# Patient Record
Sex: Female | Born: 1935 | Race: Black or African American | Hispanic: No | Smoking: Never smoker
Health system: Southern US, Community
[De-identification: ages and names within clinical notes are randomized; demographics above are authoritative.]

## PROBLEM LIST (undated history)

## (undated) DIAGNOSIS — K5792 Diverticulitis of intestine, part unspecified, without perforation or abscess without bleeding: Secondary | ICD-10-CM

## (undated) DIAGNOSIS — I10 Essential (primary) hypertension: Secondary | ICD-10-CM

## (undated) DIAGNOSIS — I519 Heart disease, unspecified: Secondary | ICD-10-CM

## (undated) DIAGNOSIS — R002 Palpitations: Secondary | ICD-10-CM

## (undated) DIAGNOSIS — E7800 Pure hypercholesterolemia, unspecified: Secondary | ICD-10-CM

## (undated) DIAGNOSIS — I209 Angina pectoris, unspecified: Secondary | ICD-10-CM

## (undated) DIAGNOSIS — M1990 Unspecified osteoarthritis, unspecified site: Secondary | ICD-10-CM

## (undated) DIAGNOSIS — I499 Cardiac arrhythmia, unspecified: Secondary | ICD-10-CM

## (undated) DIAGNOSIS — E119 Type 2 diabetes mellitus without complications: Secondary | ICD-10-CM

## (undated) DIAGNOSIS — I2510 Atherosclerotic heart disease of native coronary artery without angina pectoris: Secondary | ICD-10-CM

## (undated) HISTORY — PX: CARDIAC SURGERY: SHX584

## (undated) HISTORY — PX: CORONARY ANGIOPLASTY: SHX604

## (undated) HISTORY — PX: FINGER SURGERY: SHX640

## (undated) HISTORY — PX: TONSILLECTOMY: SUR1361

---

## 2000-12-15 ENCOUNTER — Other Ambulatory Visit: Admission: RE | Admit: 2000-12-15 | Discharge: 2000-12-15 | Payer: Self-pay | Admitting: Family Medicine

## 2004-01-15 LAB — HM PAP SMEAR: HM Pap smear: NORMAL

## 2004-03-10 ENCOUNTER — Ambulatory Visit: Payer: Self-pay | Admitting: Family Medicine

## 2005-04-30 ENCOUNTER — Ambulatory Visit: Payer: Self-pay | Admitting: Family Medicine

## 2005-07-30 ENCOUNTER — Ambulatory Visit: Payer: Self-pay | Admitting: Unknown Physician Specialty

## 2006-05-06 ENCOUNTER — Ambulatory Visit: Payer: Self-pay | Admitting: Family Medicine

## 2006-08-13 ENCOUNTER — Ambulatory Visit: Payer: Self-pay | Admitting: Family Medicine

## 2006-10-12 ENCOUNTER — Ambulatory Visit: Payer: Self-pay | Admitting: Unknown Physician Specialty

## 2006-10-20 ENCOUNTER — Ambulatory Visit: Payer: Self-pay | Admitting: Unknown Physician Specialty

## 2007-04-06 ENCOUNTER — Emergency Department: Payer: Self-pay | Admitting: Emergency Medicine

## 2007-04-06 ENCOUNTER — Other Ambulatory Visit: Payer: Self-pay

## 2007-05-11 ENCOUNTER — Ambulatory Visit: Payer: Self-pay | Admitting: Unknown Physician Specialty

## 2007-06-14 LAB — HEPATIC FUNCTION PANEL
ALT: 17 U/L (ref 7–35)
AST: 16 U/L (ref 13–35)
Alkaline Phosphatase: 71 U/L (ref 25–125)
Bilirubin, Total: 0.3 mg/dL

## 2007-06-14 LAB — HM DEXA SCAN: HM Dexa Scan: NORMAL

## 2008-07-05 ENCOUNTER — Ambulatory Visit: Payer: Self-pay | Admitting: Family Medicine

## 2008-08-15 ENCOUNTER — Ambulatory Visit: Payer: Self-pay | Admitting: Unknown Physician Specialty

## 2008-08-22 ENCOUNTER — Ambulatory Visit: Payer: Self-pay | Admitting: Family Medicine

## 2008-11-27 ENCOUNTER — Ambulatory Visit: Payer: Self-pay | Admitting: Unknown Physician Specialty

## 2008-11-27 LAB — HM COLONOSCOPY

## 2009-03-12 ENCOUNTER — Ambulatory Visit: Payer: Self-pay | Admitting: Unknown Physician Specialty

## 2009-07-09 ENCOUNTER — Ambulatory Visit: Payer: Self-pay | Admitting: Family Medicine

## 2010-05-09 ENCOUNTER — Emergency Department: Payer: Self-pay | Admitting: Emergency Medicine

## 2010-05-12 ENCOUNTER — Ambulatory Visit: Payer: Self-pay | Admitting: Family Medicine

## 2010-11-05 ENCOUNTER — Ambulatory Visit: Payer: Self-pay | Admitting: Family Medicine

## 2011-03-30 DIAGNOSIS — H6120 Impacted cerumen, unspecified ear: Secondary | ICD-10-CM | POA: Diagnosis not present

## 2011-03-30 DIAGNOSIS — H9209 Otalgia, unspecified ear: Secondary | ICD-10-CM | POA: Diagnosis not present

## 2011-04-10 DIAGNOSIS — Z23 Encounter for immunization: Secondary | ICD-10-CM | POA: Diagnosis not present

## 2011-04-10 DIAGNOSIS — E119 Type 2 diabetes mellitus without complications: Secondary | ICD-10-CM | POA: Diagnosis not present

## 2011-04-10 DIAGNOSIS — M6530 Trigger finger, unspecified finger: Secondary | ICD-10-CM | POA: Diagnosis not present

## 2011-05-06 DIAGNOSIS — IMO0001 Reserved for inherently not codable concepts without codable children: Secondary | ICD-10-CM | POA: Diagnosis not present

## 2011-05-06 DIAGNOSIS — I10 Essential (primary) hypertension: Secondary | ICD-10-CM | POA: Diagnosis not present

## 2011-05-06 DIAGNOSIS — E785 Hyperlipidemia, unspecified: Secondary | ICD-10-CM | POA: Diagnosis not present

## 2011-05-06 DIAGNOSIS — E119 Type 2 diabetes mellitus without complications: Secondary | ICD-10-CM | POA: Diagnosis not present

## 2011-06-02 DIAGNOSIS — M6530 Trigger finger, unspecified finger: Secondary | ICD-10-CM | POA: Diagnosis not present

## 2011-06-03 DIAGNOSIS — M6530 Trigger finger, unspecified finger: Secondary | ICD-10-CM | POA: Diagnosis not present

## 2011-06-07 ENCOUNTER — Emergency Department: Payer: Self-pay | Admitting: Emergency Medicine

## 2011-06-07 DIAGNOSIS — Z79899 Other long term (current) drug therapy: Secondary | ICD-10-CM | POA: Diagnosis not present

## 2011-06-07 DIAGNOSIS — E119 Type 2 diabetes mellitus without complications: Secondary | ICD-10-CM | POA: Diagnosis not present

## 2011-06-07 DIAGNOSIS — E785 Hyperlipidemia, unspecified: Secondary | ICD-10-CM | POA: Diagnosis not present

## 2011-06-07 DIAGNOSIS — R079 Chest pain, unspecified: Secondary | ICD-10-CM | POA: Diagnosis not present

## 2011-06-07 DIAGNOSIS — I10 Essential (primary) hypertension: Secondary | ICD-10-CM | POA: Diagnosis not present

## 2011-06-07 DIAGNOSIS — R52 Pain, unspecified: Secondary | ICD-10-CM | POA: Diagnosis not present

## 2011-06-07 LAB — CBC
HGB: 13.2 g/dL (ref 12.0–16.0)
MCHC: 31.5 g/dL — ABNORMAL LOW (ref 32.0–36.0)
MCV: 87 fL (ref 80–100)
Platelet: 208 10*3/uL (ref 150–440)
RBC: 4.85 10*6/uL (ref 3.80–5.20)
RDW: 13.8 % (ref 11.5–14.5)
WBC: 5 10*3/uL (ref 3.6–11.0)

## 2011-06-07 LAB — BASIC METABOLIC PANEL
Anion Gap: 9 (ref 7–16)
BUN: 16 mg/dL (ref 7–18)
Chloride: 106 mmol/L (ref 98–107)
Co2: 27 mmol/L (ref 21–32)
Creatinine: 1.04 mg/dL (ref 0.60–1.30)
EGFR (Non-African Amer.): 52 — ABNORMAL LOW
Glucose: 122 mg/dL — ABNORMAL HIGH (ref 65–99)
Osmolality: 286 (ref 275–301)
Potassium: 3.7 mmol/L (ref 3.5–5.1)
Sodium: 142 mmol/L (ref 136–145)

## 2011-06-08 DIAGNOSIS — I10 Essential (primary) hypertension: Secondary | ICD-10-CM | POA: Diagnosis not present

## 2011-06-08 DIAGNOSIS — E119 Type 2 diabetes mellitus without complications: Secondary | ICD-10-CM | POA: Diagnosis not present

## 2011-06-08 DIAGNOSIS — R011 Cardiac murmur, unspecified: Secondary | ICD-10-CM | POA: Diagnosis not present

## 2011-06-08 DIAGNOSIS — R079 Chest pain, unspecified: Secondary | ICD-10-CM | POA: Diagnosis not present

## 2011-06-08 DIAGNOSIS — E785 Hyperlipidemia, unspecified: Secondary | ICD-10-CM | POA: Diagnosis not present

## 2011-06-11 DIAGNOSIS — IMO0001 Reserved for inherently not codable concepts without codable children: Secondary | ICD-10-CM | POA: Diagnosis not present

## 2011-06-11 DIAGNOSIS — R072 Precordial pain: Secondary | ICD-10-CM | POA: Diagnosis not present

## 2011-06-11 DIAGNOSIS — R079 Chest pain, unspecified: Secondary | ICD-10-CM | POA: Diagnosis not present

## 2011-06-12 DIAGNOSIS — I10 Essential (primary) hypertension: Secondary | ICD-10-CM | POA: Diagnosis not present

## 2011-06-12 DIAGNOSIS — E119 Type 2 diabetes mellitus without complications: Secondary | ICD-10-CM | POA: Diagnosis not present

## 2011-06-12 DIAGNOSIS — R079 Chest pain, unspecified: Secondary | ICD-10-CM | POA: Diagnosis not present

## 2011-06-12 DIAGNOSIS — E785 Hyperlipidemia, unspecified: Secondary | ICD-10-CM | POA: Diagnosis not present

## 2011-06-12 DIAGNOSIS — Z79899 Other long term (current) drug therapy: Secondary | ICD-10-CM | POA: Diagnosis not present

## 2011-06-17 ENCOUNTER — Ambulatory Visit: Payer: Self-pay | Admitting: Cardiovascular Disease

## 2011-06-17 DIAGNOSIS — I2510 Atherosclerotic heart disease of native coronary artery without angina pectoris: Secondary | ICD-10-CM | POA: Diagnosis not present

## 2011-06-17 DIAGNOSIS — Z8249 Family history of ischemic heart disease and other diseases of the circulatory system: Secondary | ICD-10-CM | POA: Diagnosis not present

## 2011-06-17 DIAGNOSIS — Z79899 Other long term (current) drug therapy: Secondary | ICD-10-CM | POA: Diagnosis not present

## 2011-06-17 DIAGNOSIS — Z9889 Other specified postprocedural states: Secondary | ICD-10-CM | POA: Diagnosis not present

## 2011-06-17 DIAGNOSIS — I10 Essential (primary) hypertension: Secondary | ICD-10-CM | POA: Diagnosis not present

## 2011-06-17 DIAGNOSIS — E785 Hyperlipidemia, unspecified: Secondary | ICD-10-CM | POA: Diagnosis not present

## 2011-06-17 DIAGNOSIS — Z833 Family history of diabetes mellitus: Secondary | ICD-10-CM | POA: Diagnosis not present

## 2011-06-17 DIAGNOSIS — I200 Unstable angina: Secondary | ICD-10-CM | POA: Diagnosis not present

## 2011-06-17 DIAGNOSIS — Z888 Allergy status to other drugs, medicaments and biological substances status: Secondary | ICD-10-CM | POA: Diagnosis not present

## 2011-06-17 DIAGNOSIS — Z882 Allergy status to sulfonamides status: Secondary | ICD-10-CM | POA: Diagnosis not present

## 2011-06-17 DIAGNOSIS — Z8049 Family history of malignant neoplasm of other genital organs: Secondary | ICD-10-CM | POA: Diagnosis not present

## 2011-06-17 DIAGNOSIS — K219 Gastro-esophageal reflux disease without esophagitis: Secondary | ICD-10-CM | POA: Diagnosis not present

## 2011-06-17 DIAGNOSIS — Z800 Family history of malignant neoplasm of digestive organs: Secondary | ICD-10-CM | POA: Diagnosis not present

## 2011-06-17 DIAGNOSIS — E119 Type 2 diabetes mellitus without complications: Secondary | ICD-10-CM | POA: Diagnosis not present

## 2011-06-17 HISTORY — PX: CORONARY STENT PLACEMENT: SHX1402

## 2011-06-17 LAB — CK TOTAL AND CKMB (NOT AT ARMC): CK, Total: 101 U/L (ref 21–215)

## 2011-06-18 DIAGNOSIS — I200 Unstable angina: Secondary | ICD-10-CM | POA: Diagnosis not present

## 2011-06-18 DIAGNOSIS — I2510 Atherosclerotic heart disease of native coronary artery without angina pectoris: Secondary | ICD-10-CM | POA: Diagnosis not present

## 2011-06-18 LAB — BASIC METABOLIC PANEL
Anion Gap: 8 (ref 7–16)
BUN: 15 mg/dL (ref 7–18)
Calcium, Total: 8.9 mg/dL (ref 8.5–10.1)
Chloride: 104 mmol/L (ref 98–107)
Creatinine: 1.16 mg/dL (ref 0.60–1.30)
EGFR (African American): 53 — ABNORMAL LOW
EGFR (Non-African Amer.): 46 — ABNORMAL LOW
Osmolality: 283 (ref 275–301)

## 2011-06-25 DIAGNOSIS — I10 Essential (primary) hypertension: Secondary | ICD-10-CM | POA: Diagnosis not present

## 2011-06-25 DIAGNOSIS — E785 Hyperlipidemia, unspecified: Secondary | ICD-10-CM | POA: Diagnosis not present

## 2011-06-25 DIAGNOSIS — K219 Gastro-esophageal reflux disease without esophagitis: Secondary | ICD-10-CM | POA: Diagnosis not present

## 2011-06-25 DIAGNOSIS — I2510 Atherosclerotic heart disease of native coronary artery without angina pectoris: Secondary | ICD-10-CM | POA: Diagnosis not present

## 2011-07-13 DIAGNOSIS — I209 Angina pectoris, unspecified: Secondary | ICD-10-CM | POA: Diagnosis not present

## 2011-07-13 DIAGNOSIS — R0609 Other forms of dyspnea: Secondary | ICD-10-CM | POA: Diagnosis not present

## 2011-07-13 DIAGNOSIS — E7800 Pure hypercholesterolemia, unspecified: Secondary | ICD-10-CM | POA: Diagnosis not present

## 2011-07-13 DIAGNOSIS — IMO0001 Reserved for inherently not codable concepts without codable children: Secondary | ICD-10-CM | POA: Diagnosis not present

## 2011-07-13 DIAGNOSIS — I10 Essential (primary) hypertension: Secondary | ICD-10-CM | POA: Diagnosis not present

## 2011-07-13 DIAGNOSIS — I2510 Atherosclerotic heart disease of native coronary artery without angina pectoris: Secondary | ICD-10-CM | POA: Diagnosis not present

## 2011-07-13 DIAGNOSIS — R0989 Other specified symptoms and signs involving the circulatory and respiratory systems: Secondary | ICD-10-CM | POA: Diagnosis not present

## 2011-07-16 DIAGNOSIS — E7800 Pure hypercholesterolemia, unspecified: Secondary | ICD-10-CM | POA: Diagnosis not present

## 2011-07-16 DIAGNOSIS — I2510 Atherosclerotic heart disease of native coronary artery without angina pectoris: Secondary | ICD-10-CM | POA: Diagnosis not present

## 2011-07-16 DIAGNOSIS — I10 Essential (primary) hypertension: Secondary | ICD-10-CM | POA: Diagnosis not present

## 2011-07-16 DIAGNOSIS — E119 Type 2 diabetes mellitus without complications: Secondary | ICD-10-CM | POA: Diagnosis not present

## 2011-07-29 ENCOUNTER — Encounter: Payer: Self-pay | Admitting: Internal Medicine

## 2011-07-29 DIAGNOSIS — Z9861 Coronary angioplasty status: Secondary | ICD-10-CM | POA: Diagnosis not present

## 2011-07-29 DIAGNOSIS — Z5189 Encounter for other specified aftercare: Secondary | ICD-10-CM | POA: Diagnosis not present

## 2011-08-04 DIAGNOSIS — M6530 Trigger finger, unspecified finger: Secondary | ICD-10-CM | POA: Diagnosis not present

## 2011-08-24 ENCOUNTER — Encounter: Payer: Self-pay | Admitting: Internal Medicine

## 2011-08-24 DIAGNOSIS — Z951 Presence of aortocoronary bypass graft: Secondary | ICD-10-CM | POA: Diagnosis not present

## 2011-08-24 DIAGNOSIS — Z9861 Coronary angioplasty status: Secondary | ICD-10-CM | POA: Diagnosis not present

## 2011-08-24 DIAGNOSIS — I509 Heart failure, unspecified: Secondary | ICD-10-CM | POA: Diagnosis not present

## 2011-08-24 DIAGNOSIS — Z5189 Encounter for other specified aftercare: Secondary | ICD-10-CM | POA: Diagnosis not present

## 2011-08-24 DIAGNOSIS — I209 Angina pectoris, unspecified: Secondary | ICD-10-CM | POA: Diagnosis not present

## 2011-09-01 DIAGNOSIS — M6530 Trigger finger, unspecified finger: Secondary | ICD-10-CM | POA: Diagnosis not present

## 2011-09-07 DIAGNOSIS — G4719 Other hypersomnia: Secondary | ICD-10-CM | POA: Diagnosis not present

## 2011-09-07 DIAGNOSIS — E7800 Pure hypercholesterolemia, unspecified: Secondary | ICD-10-CM | POA: Diagnosis not present

## 2011-09-07 DIAGNOSIS — I10 Essential (primary) hypertension: Secondary | ICD-10-CM | POA: Diagnosis not present

## 2011-09-07 DIAGNOSIS — E119 Type 2 diabetes mellitus without complications: Secondary | ICD-10-CM | POA: Diagnosis not present

## 2011-09-24 ENCOUNTER — Encounter: Payer: Self-pay | Admitting: Internal Medicine

## 2011-09-25 DIAGNOSIS — Z5189 Encounter for other specified aftercare: Secondary | ICD-10-CM | POA: Diagnosis not present

## 2011-09-25 DIAGNOSIS — I209 Angina pectoris, unspecified: Secondary | ICD-10-CM | POA: Diagnosis not present

## 2011-09-25 DIAGNOSIS — Z951 Presence of aortocoronary bypass graft: Secondary | ICD-10-CM | POA: Diagnosis not present

## 2011-09-25 DIAGNOSIS — Z9861 Coronary angioplasty status: Secondary | ICD-10-CM | POA: Diagnosis not present

## 2011-09-25 DIAGNOSIS — I509 Heart failure, unspecified: Secondary | ICD-10-CM | POA: Diagnosis not present

## 2011-09-28 DIAGNOSIS — Z951 Presence of aortocoronary bypass graft: Secondary | ICD-10-CM | POA: Diagnosis not present

## 2011-09-28 DIAGNOSIS — Z5189 Encounter for other specified aftercare: Secondary | ICD-10-CM | POA: Diagnosis not present

## 2011-09-28 DIAGNOSIS — I509 Heart failure, unspecified: Secondary | ICD-10-CM | POA: Diagnosis not present

## 2011-09-28 DIAGNOSIS — I209 Angina pectoris, unspecified: Secondary | ICD-10-CM | POA: Diagnosis not present

## 2011-09-28 DIAGNOSIS — Z9861 Coronary angioplasty status: Secondary | ICD-10-CM | POA: Diagnosis not present

## 2011-10-13 DIAGNOSIS — Z79899 Other long term (current) drug therapy: Secondary | ICD-10-CM | POA: Diagnosis not present

## 2011-10-13 DIAGNOSIS — I10 Essential (primary) hypertension: Secondary | ICD-10-CM | POA: Diagnosis not present

## 2011-10-13 DIAGNOSIS — E785 Hyperlipidemia, unspecified: Secondary | ICD-10-CM | POA: Diagnosis not present

## 2011-10-13 DIAGNOSIS — E119 Type 2 diabetes mellitus without complications: Secondary | ICD-10-CM | POA: Diagnosis not present

## 2011-10-15 DIAGNOSIS — E119 Type 2 diabetes mellitus without complications: Secondary | ICD-10-CM | POA: Diagnosis not present

## 2011-10-15 DIAGNOSIS — I2510 Atherosclerotic heart disease of native coronary artery without angina pectoris: Secondary | ICD-10-CM | POA: Diagnosis not present

## 2011-10-15 DIAGNOSIS — I10 Essential (primary) hypertension: Secondary | ICD-10-CM | POA: Diagnosis not present

## 2012-01-06 ENCOUNTER — Ambulatory Visit: Payer: Self-pay | Admitting: Family Medicine

## 2012-01-06 DIAGNOSIS — Z1231 Encounter for screening mammogram for malignant neoplasm of breast: Secondary | ICD-10-CM | POA: Diagnosis not present

## 2012-01-12 DIAGNOSIS — Z01419 Encounter for gynecological examination (general) (routine) without abnormal findings: Secondary | ICD-10-CM | POA: Diagnosis not present

## 2012-01-14 DIAGNOSIS — I2510 Atherosclerotic heart disease of native coronary artery without angina pectoris: Secondary | ICD-10-CM | POA: Diagnosis not present

## 2012-01-14 DIAGNOSIS — R000 Tachycardia, unspecified: Secondary | ICD-10-CM | POA: Diagnosis not present

## 2012-01-26 DIAGNOSIS — E119 Type 2 diabetes mellitus without complications: Secondary | ICD-10-CM | POA: Diagnosis not present

## 2012-01-26 DIAGNOSIS — I2510 Atherosclerotic heart disease of native coronary artery without angina pectoris: Secondary | ICD-10-CM | POA: Diagnosis not present

## 2012-01-26 DIAGNOSIS — E785 Hyperlipidemia, unspecified: Secondary | ICD-10-CM | POA: Diagnosis not present

## 2012-01-26 DIAGNOSIS — Z23 Encounter for immunization: Secondary | ICD-10-CM | POA: Diagnosis not present

## 2012-01-26 DIAGNOSIS — I10 Essential (primary) hypertension: Secondary | ICD-10-CM | POA: Diagnosis not present

## 2012-02-18 DIAGNOSIS — K219 Gastro-esophageal reflux disease without esophagitis: Secondary | ICD-10-CM | POA: Diagnosis not present

## 2012-02-18 DIAGNOSIS — K5730 Diverticulosis of large intestine without perforation or abscess without bleeding: Secondary | ICD-10-CM | POA: Diagnosis not present

## 2012-02-18 DIAGNOSIS — Z23 Encounter for immunization: Secondary | ICD-10-CM | POA: Diagnosis not present

## 2012-02-18 DIAGNOSIS — R1012 Left upper quadrant pain: Secondary | ICD-10-CM | POA: Diagnosis not present

## 2012-03-11 ENCOUNTER — Ambulatory Visit: Payer: Self-pay | Admitting: Family Medicine

## 2012-03-11 DIAGNOSIS — I10 Essential (primary) hypertension: Secondary | ICD-10-CM | POA: Diagnosis not present

## 2012-03-11 DIAGNOSIS — Z23 Encounter for immunization: Secondary | ICD-10-CM | POA: Diagnosis not present

## 2012-03-11 DIAGNOSIS — E7800 Pure hypercholesterolemia, unspecified: Secondary | ICD-10-CM | POA: Diagnosis not present

## 2012-03-11 DIAGNOSIS — M25519 Pain in unspecified shoulder: Secondary | ICD-10-CM | POA: Diagnosis not present

## 2012-05-05 DIAGNOSIS — E119 Type 2 diabetes mellitus without complications: Secondary | ICD-10-CM | POA: Diagnosis not present

## 2012-05-31 DIAGNOSIS — R0609 Other forms of dyspnea: Secondary | ICD-10-CM | POA: Diagnosis not present

## 2012-05-31 DIAGNOSIS — I369 Nonrheumatic tricuspid valve disorder, unspecified: Secondary | ICD-10-CM | POA: Diagnosis not present

## 2012-05-31 DIAGNOSIS — I2510 Atherosclerotic heart disease of native coronary artery without angina pectoris: Secondary | ICD-10-CM | POA: Diagnosis not present

## 2012-05-31 DIAGNOSIS — R002 Palpitations: Secondary | ICD-10-CM | POA: Diagnosis not present

## 2012-05-31 DIAGNOSIS — I200 Unstable angina: Secondary | ICD-10-CM | POA: Diagnosis not present

## 2012-06-18 ENCOUNTER — Emergency Department: Payer: Self-pay | Admitting: Emergency Medicine

## 2012-06-18 DIAGNOSIS — R5381 Other malaise: Secondary | ICD-10-CM | POA: Diagnosis not present

## 2012-06-18 DIAGNOSIS — R51 Headache: Secondary | ICD-10-CM | POA: Diagnosis not present

## 2012-06-18 DIAGNOSIS — N390 Urinary tract infection, site not specified: Secondary | ICD-10-CM | POA: Diagnosis not present

## 2012-06-18 DIAGNOSIS — Z79899 Other long term (current) drug therapy: Secondary | ICD-10-CM | POA: Diagnosis not present

## 2012-06-18 DIAGNOSIS — E119 Type 2 diabetes mellitus without complications: Secondary | ICD-10-CM | POA: Diagnosis not present

## 2012-06-18 DIAGNOSIS — I10 Essential (primary) hypertension: Secondary | ICD-10-CM | POA: Diagnosis not present

## 2012-06-18 DIAGNOSIS — R42 Dizziness and giddiness: Secondary | ICD-10-CM | POA: Diagnosis not present

## 2012-06-18 LAB — URINALYSIS, COMPLETE
Ketone: NEGATIVE
Ph: 5 (ref 4.5–8.0)
Specific Gravity: 1.009 (ref 1.003–1.030)
WBC UR: 8 /HPF (ref 0–5)

## 2012-06-18 LAB — CBC
HCT: 44.4 % (ref 35.0–47.0)
HGB: 14.2 g/dL (ref 12.0–16.0)
MCH: 27.2 pg (ref 26.0–34.0)
MCHC: 32 g/dL (ref 32.0–36.0)
MCV: 85 fL (ref 80–100)
RBC: 5.22 10*6/uL — ABNORMAL HIGH (ref 3.80–5.20)
WBC: 8 10*3/uL (ref 3.6–11.0)

## 2012-06-18 LAB — COMPREHENSIVE METABOLIC PANEL
Alkaline Phosphatase: 74 U/L (ref 50–136)
Anion Gap: 5 — ABNORMAL LOW (ref 7–16)
Bilirubin,Total: 0.4 mg/dL (ref 0.2–1.0)
Calcium, Total: 9.6 mg/dL (ref 8.5–10.1)
Chloride: 105 mmol/L (ref 98–107)
EGFR (African American): 54 — ABNORMAL LOW
Glucose: 109 mg/dL — ABNORMAL HIGH (ref 65–99)
SGPT (ALT): 19 U/L (ref 12–78)
Sodium: 141 mmol/L (ref 136–145)
Total Protein: 7.4 g/dL (ref 6.4–8.2)

## 2012-06-18 LAB — TROPONIN I: Troponin-I: <0.02 ng/mL

## 2012-06-21 DIAGNOSIS — M19049 Primary osteoarthritis, unspecified hand: Secondary | ICD-10-CM | POA: Diagnosis not present

## 2012-06-23 DIAGNOSIS — I2510 Atherosclerotic heart disease of native coronary artery without angina pectoris: Secondary | ICD-10-CM | POA: Diagnosis not present

## 2012-06-23 DIAGNOSIS — I209 Angina pectoris, unspecified: Secondary | ICD-10-CM | POA: Diagnosis not present

## 2012-06-30 DIAGNOSIS — I2510 Atherosclerotic heart disease of native coronary artery without angina pectoris: Secondary | ICD-10-CM | POA: Diagnosis not present

## 2012-06-30 DIAGNOSIS — I10 Essential (primary) hypertension: Secondary | ICD-10-CM | POA: Diagnosis not present

## 2012-06-30 DIAGNOSIS — E782 Mixed hyperlipidemia: Secondary | ICD-10-CM | POA: Diagnosis not present

## 2012-07-05 DIAGNOSIS — M064 Inflammatory polyarthropathy: Secondary | ICD-10-CM | POA: Diagnosis not present

## 2012-07-12 ENCOUNTER — Encounter: Payer: Self-pay | Admitting: Rheumatology

## 2012-07-12 DIAGNOSIS — M25649 Stiffness of unspecified hand, not elsewhere classified: Secondary | ICD-10-CM | POA: Diagnosis not present

## 2012-07-12 DIAGNOSIS — IMO0001 Reserved for inherently not codable concepts without codable children: Secondary | ICD-10-CM | POA: Diagnosis not present

## 2012-07-12 DIAGNOSIS — M6281 Muscle weakness (generalized): Secondary | ICD-10-CM | POA: Diagnosis not present

## 2012-07-12 DIAGNOSIS — M25549 Pain in joints of unspecified hand: Secondary | ICD-10-CM | POA: Diagnosis not present

## 2012-07-19 DIAGNOSIS — M25549 Pain in joints of unspecified hand: Secondary | ICD-10-CM | POA: Diagnosis not present

## 2012-07-19 DIAGNOSIS — IMO0001 Reserved for inherently not codable concepts without codable children: Secondary | ICD-10-CM | POA: Diagnosis not present

## 2012-07-19 DIAGNOSIS — M25649 Stiffness of unspecified hand, not elsewhere classified: Secondary | ICD-10-CM | POA: Diagnosis not present

## 2012-07-19 DIAGNOSIS — M6281 Muscle weakness (generalized): Secondary | ICD-10-CM | POA: Diagnosis not present

## 2012-07-24 ENCOUNTER — Encounter: Payer: Self-pay | Admitting: Rheumatology

## 2012-07-24 DIAGNOSIS — M6281 Muscle weakness (generalized): Secondary | ICD-10-CM | POA: Diagnosis not present

## 2012-07-24 DIAGNOSIS — M25549 Pain in joints of unspecified hand: Secondary | ICD-10-CM | POA: Diagnosis not present

## 2012-07-24 DIAGNOSIS — M25649 Stiffness of unspecified hand, not elsewhere classified: Secondary | ICD-10-CM | POA: Diagnosis not present

## 2012-07-24 DIAGNOSIS — IMO0001 Reserved for inherently not codable concepts without codable children: Secondary | ICD-10-CM | POA: Diagnosis not present

## 2012-07-26 DIAGNOSIS — M6281 Muscle weakness (generalized): Secondary | ICD-10-CM | POA: Diagnosis not present

## 2012-07-26 DIAGNOSIS — IMO0001 Reserved for inherently not codable concepts without codable children: Secondary | ICD-10-CM | POA: Diagnosis not present

## 2012-07-26 DIAGNOSIS — M25549 Pain in joints of unspecified hand: Secondary | ICD-10-CM | POA: Diagnosis not present

## 2012-07-26 DIAGNOSIS — M25649 Stiffness of unspecified hand, not elsewhere classified: Secondary | ICD-10-CM | POA: Diagnosis not present

## 2012-07-28 DIAGNOSIS — E7800 Pure hypercholesterolemia, unspecified: Secondary | ICD-10-CM | POA: Diagnosis not present

## 2012-07-28 DIAGNOSIS — M6530 Trigger finger, unspecified finger: Secondary | ICD-10-CM | POA: Diagnosis not present

## 2012-07-28 DIAGNOSIS — E119 Type 2 diabetes mellitus without complications: Secondary | ICD-10-CM | POA: Diagnosis not present

## 2012-07-28 DIAGNOSIS — M19049 Primary osteoarthritis, unspecified hand: Secondary | ICD-10-CM | POA: Diagnosis not present

## 2012-07-28 DIAGNOSIS — G589 Mononeuropathy, unspecified: Secondary | ICD-10-CM | POA: Diagnosis not present

## 2012-07-28 DIAGNOSIS — I10 Essential (primary) hypertension: Secondary | ICD-10-CM | POA: Diagnosis not present

## 2012-07-29 DIAGNOSIS — I10 Essential (primary) hypertension: Secondary | ICD-10-CM | POA: Diagnosis not present

## 2012-07-29 DIAGNOSIS — R5383 Other fatigue: Secondary | ICD-10-CM | POA: Diagnosis not present

## 2012-07-29 DIAGNOSIS — IMO0001 Reserved for inherently not codable concepts without codable children: Secondary | ICD-10-CM | POA: Diagnosis not present

## 2012-07-29 DIAGNOSIS — E7800 Pure hypercholesterolemia, unspecified: Secondary | ICD-10-CM | POA: Diagnosis not present

## 2012-07-29 DIAGNOSIS — E119 Type 2 diabetes mellitus without complications: Secondary | ICD-10-CM | POA: Diagnosis not present

## 2012-07-29 DIAGNOSIS — G589 Mononeuropathy, unspecified: Secondary | ICD-10-CM | POA: Diagnosis not present

## 2012-08-23 DIAGNOSIS — G589 Mononeuropathy, unspecified: Secondary | ICD-10-CM | POA: Diagnosis not present

## 2012-08-23 DIAGNOSIS — Z79899 Other long term (current) drug therapy: Secondary | ICD-10-CM | POA: Diagnosis not present

## 2012-08-23 DIAGNOSIS — IMO0001 Reserved for inherently not codable concepts without codable children: Secondary | ICD-10-CM | POA: Diagnosis not present

## 2012-08-23 DIAGNOSIS — E7800 Pure hypercholesterolemia, unspecified: Secondary | ICD-10-CM | POA: Diagnosis not present

## 2012-10-11 DIAGNOSIS — I10 Essential (primary) hypertension: Secondary | ICD-10-CM | POA: Diagnosis not present

## 2012-10-11 DIAGNOSIS — I2510 Atherosclerotic heart disease of native coronary artery without angina pectoris: Secondary | ICD-10-CM | POA: Diagnosis not present

## 2012-10-11 DIAGNOSIS — G589 Mononeuropathy, unspecified: Secondary | ICD-10-CM | POA: Diagnosis not present

## 2012-10-11 DIAGNOSIS — R351 Nocturia: Secondary | ICD-10-CM | POA: Diagnosis not present

## 2012-10-11 DIAGNOSIS — IMO0001 Reserved for inherently not codable concepts without codable children: Secondary | ICD-10-CM | POA: Diagnosis not present

## 2012-11-18 DIAGNOSIS — E7800 Pure hypercholesterolemia, unspecified: Secondary | ICD-10-CM | POA: Diagnosis not present

## 2012-11-22 DIAGNOSIS — G589 Mononeuropathy, unspecified: Secondary | ICD-10-CM | POA: Diagnosis not present

## 2012-11-22 DIAGNOSIS — E7800 Pure hypercholesterolemia, unspecified: Secondary | ICD-10-CM | POA: Diagnosis not present

## 2012-11-22 DIAGNOSIS — Z23 Encounter for immunization: Secondary | ICD-10-CM | POA: Diagnosis not present

## 2012-11-22 DIAGNOSIS — IMO0001 Reserved for inherently not codable concepts without codable children: Secondary | ICD-10-CM | POA: Diagnosis not present

## 2012-11-22 DIAGNOSIS — I10 Essential (primary) hypertension: Secondary | ICD-10-CM | POA: Diagnosis not present

## 2012-11-25 DIAGNOSIS — IMO0001 Reserved for inherently not codable concepts without codable children: Secondary | ICD-10-CM | POA: Diagnosis not present

## 2012-11-25 DIAGNOSIS — M064 Inflammatory polyarthropathy: Secondary | ICD-10-CM | POA: Diagnosis not present

## 2012-12-08 DIAGNOSIS — IMO0001 Reserved for inherently not codable concepts without codable children: Secondary | ICD-10-CM | POA: Diagnosis not present

## 2012-12-27 DIAGNOSIS — G589 Mononeuropathy, unspecified: Secondary | ICD-10-CM | POA: Diagnosis not present

## 2012-12-27 DIAGNOSIS — E7800 Pure hypercholesterolemia, unspecified: Secondary | ICD-10-CM | POA: Diagnosis not present

## 2012-12-27 DIAGNOSIS — Z23 Encounter for immunization: Secondary | ICD-10-CM | POA: Diagnosis not present

## 2012-12-27 DIAGNOSIS — E119 Type 2 diabetes mellitus without complications: Secondary | ICD-10-CM | POA: Diagnosis not present

## 2012-12-27 DIAGNOSIS — IMO0001 Reserved for inherently not codable concepts without codable children: Secondary | ICD-10-CM | POA: Diagnosis not present

## 2013-01-03 DIAGNOSIS — I209 Angina pectoris, unspecified: Secondary | ICD-10-CM | POA: Diagnosis not present

## 2013-01-03 DIAGNOSIS — R002 Palpitations: Secondary | ICD-10-CM | POA: Diagnosis not present

## 2013-01-03 DIAGNOSIS — I10 Essential (primary) hypertension: Secondary | ICD-10-CM | POA: Diagnosis not present

## 2013-01-12 DIAGNOSIS — H6980 Other specified disorders of Eustachian tube, unspecified ear: Secondary | ICD-10-CM | POA: Diagnosis not present

## 2013-01-12 DIAGNOSIS — IMO0001 Reserved for inherently not codable concepts without codable children: Secondary | ICD-10-CM | POA: Diagnosis not present

## 2013-01-12 DIAGNOSIS — G589 Mononeuropathy, unspecified: Secondary | ICD-10-CM | POA: Diagnosis not present

## 2013-01-12 DIAGNOSIS — E7800 Pure hypercholesterolemia, unspecified: Secondary | ICD-10-CM | POA: Diagnosis not present

## 2013-01-12 DIAGNOSIS — Z23 Encounter for immunization: Secondary | ICD-10-CM | POA: Diagnosis not present

## 2013-05-16 DIAGNOSIS — I10 Essential (primary) hypertension: Secondary | ICD-10-CM | POA: Diagnosis not present

## 2013-05-16 DIAGNOSIS — E119 Type 2 diabetes mellitus without complications: Secondary | ICD-10-CM | POA: Diagnosis not present

## 2013-05-16 DIAGNOSIS — R079 Chest pain, unspecified: Secondary | ICD-10-CM | POA: Diagnosis not present

## 2013-05-16 DIAGNOSIS — R5381 Other malaise: Secondary | ICD-10-CM | POA: Diagnosis not present

## 2013-05-16 DIAGNOSIS — K2100 Gastro-esophageal reflux disease with esophagitis, without bleeding: Secondary | ICD-10-CM | POA: Diagnosis not present

## 2013-05-16 DIAGNOSIS — R5383 Other fatigue: Secondary | ICD-10-CM | POA: Diagnosis not present

## 2013-05-16 DIAGNOSIS — I2510 Atherosclerotic heart disease of native coronary artery without angina pectoris: Secondary | ICD-10-CM | POA: Diagnosis not present

## 2013-05-19 DIAGNOSIS — K219 Gastro-esophageal reflux disease without esophagitis: Secondary | ICD-10-CM | POA: Insufficient documentation

## 2013-05-19 DIAGNOSIS — G629 Polyneuropathy, unspecified: Secondary | ICD-10-CM | POA: Insufficient documentation

## 2013-05-19 DIAGNOSIS — E785 Hyperlipidemia, unspecified: Secondary | ICD-10-CM | POA: Insufficient documentation

## 2013-05-19 DIAGNOSIS — D126 Benign neoplasm of colon, unspecified: Secondary | ICD-10-CM | POA: Insufficient documentation

## 2013-05-19 DIAGNOSIS — R002 Palpitations: Secondary | ICD-10-CM | POA: Diagnosis not present

## 2013-05-19 DIAGNOSIS — E119 Type 2 diabetes mellitus without complications: Secondary | ICD-10-CM | POA: Diagnosis not present

## 2013-05-19 DIAGNOSIS — I2510 Atherosclerotic heart disease of native coronary artery without angina pectoris: Secondary | ICD-10-CM | POA: Diagnosis not present

## 2013-05-30 ENCOUNTER — Ambulatory Visit: Payer: Self-pay | Admitting: Family Medicine

## 2013-05-30 DIAGNOSIS — Z1231 Encounter for screening mammogram for malignant neoplasm of breast: Secondary | ICD-10-CM | POA: Diagnosis not present

## 2013-05-30 LAB — HM MAMMOGRAPHY: HM Mammogram: NORMAL

## 2013-06-08 DIAGNOSIS — E119 Type 2 diabetes mellitus without complications: Secondary | ICD-10-CM | POA: Diagnosis not present

## 2013-06-08 DIAGNOSIS — E7800 Pure hypercholesterolemia, unspecified: Secondary | ICD-10-CM | POA: Diagnosis not present

## 2013-06-08 DIAGNOSIS — I10 Essential (primary) hypertension: Secondary | ICD-10-CM | POA: Diagnosis not present

## 2013-06-08 DIAGNOSIS — R5381 Other malaise: Secondary | ICD-10-CM | POA: Diagnosis not present

## 2013-06-08 DIAGNOSIS — R5383 Other fatigue: Secondary | ICD-10-CM | POA: Diagnosis not present

## 2013-06-08 LAB — TSH: TSH: 3 u[IU]/mL (ref <=5.90)

## 2013-06-13 DIAGNOSIS — I10 Essential (primary) hypertension: Secondary | ICD-10-CM | POA: Diagnosis not present

## 2013-06-13 DIAGNOSIS — K2100 Gastro-esophageal reflux disease with esophagitis, without bleeding: Secondary | ICD-10-CM | POA: Diagnosis not present

## 2013-06-13 DIAGNOSIS — IMO0001 Reserved for inherently not codable concepts without codable children: Secondary | ICD-10-CM | POA: Diagnosis not present

## 2013-06-13 DIAGNOSIS — E119 Type 2 diabetes mellitus without complications: Secondary | ICD-10-CM | POA: Diagnosis not present

## 2013-06-13 DIAGNOSIS — I2510 Atherosclerotic heart disease of native coronary artery without angina pectoris: Secondary | ICD-10-CM | POA: Diagnosis not present

## 2013-07-06 DIAGNOSIS — K2100 Gastro-esophageal reflux disease with esophagitis, without bleeding: Secondary | ICD-10-CM | POA: Diagnosis not present

## 2013-07-06 DIAGNOSIS — E7800 Pure hypercholesterolemia, unspecified: Secondary | ICD-10-CM | POA: Diagnosis not present

## 2013-07-06 DIAGNOSIS — I2510 Atherosclerotic heart disease of native coronary artery without angina pectoris: Secondary | ICD-10-CM | POA: Diagnosis not present

## 2013-07-06 DIAGNOSIS — I10 Essential (primary) hypertension: Secondary | ICD-10-CM | POA: Diagnosis not present

## 2013-07-20 DIAGNOSIS — H02839 Dermatochalasis of unspecified eye, unspecified eyelid: Secondary | ICD-10-CM | POA: Diagnosis not present

## 2013-08-01 DIAGNOSIS — E7800 Pure hypercholesterolemia, unspecified: Secondary | ICD-10-CM | POA: Diagnosis not present

## 2013-08-29 DIAGNOSIS — I2510 Atherosclerotic heart disease of native coronary artery without angina pectoris: Secondary | ICD-10-CM | POA: Diagnosis not present

## 2013-08-29 DIAGNOSIS — E7800 Pure hypercholesterolemia, unspecified: Secondary | ICD-10-CM | POA: Diagnosis not present

## 2013-08-29 DIAGNOSIS — Z23 Encounter for immunization: Secondary | ICD-10-CM | POA: Diagnosis not present

## 2013-08-29 DIAGNOSIS — E1129 Type 2 diabetes mellitus with other diabetic kidney complication: Secondary | ICD-10-CM | POA: Diagnosis not present

## 2013-08-29 DIAGNOSIS — K2100 Gastro-esophageal reflux disease with esophagitis, without bleeding: Secondary | ICD-10-CM | POA: Diagnosis not present

## 2013-09-07 DIAGNOSIS — E119 Type 2 diabetes mellitus without complications: Secondary | ICD-10-CM | POA: Diagnosis not present

## 2013-10-21 ENCOUNTER — Ambulatory Visit: Payer: Self-pay | Admitting: Internal Medicine

## 2013-10-21 DIAGNOSIS — E119 Type 2 diabetes mellitus without complications: Secondary | ICD-10-CM | POA: Diagnosis not present

## 2013-10-21 DIAGNOSIS — I10 Essential (primary) hypertension: Secondary | ICD-10-CM | POA: Diagnosis not present

## 2013-10-21 DIAGNOSIS — R000 Tachycardia, unspecified: Secondary | ICD-10-CM | POA: Diagnosis not present

## 2013-10-21 DIAGNOSIS — H109 Unspecified conjunctivitis: Secondary | ICD-10-CM | POA: Diagnosis not present

## 2013-10-21 DIAGNOSIS — Z79899 Other long term (current) drug therapy: Secondary | ICD-10-CM | POA: Diagnosis not present

## 2013-11-09 DIAGNOSIS — I2510 Atherosclerotic heart disease of native coronary artery without angina pectoris: Secondary | ICD-10-CM | POA: Diagnosis not present

## 2013-11-09 DIAGNOSIS — K219 Gastro-esophageal reflux disease without esophagitis: Secondary | ICD-10-CM | POA: Diagnosis not present

## 2013-11-09 DIAGNOSIS — E785 Hyperlipidemia, unspecified: Secondary | ICD-10-CM | POA: Diagnosis not present

## 2013-11-09 DIAGNOSIS — I10 Essential (primary) hypertension: Secondary | ICD-10-CM | POA: Diagnosis not present

## 2013-11-21 DIAGNOSIS — K2100 Gastro-esophageal reflux disease with esophagitis, without bleeding: Secondary | ICD-10-CM | POA: Diagnosis not present

## 2013-11-21 DIAGNOSIS — Z23 Encounter for immunization: Secondary | ICD-10-CM | POA: Diagnosis not present

## 2013-11-21 DIAGNOSIS — I2510 Atherosclerotic heart disease of native coronary artery without angina pectoris: Secondary | ICD-10-CM | POA: Diagnosis not present

## 2013-11-21 DIAGNOSIS — E1129 Type 2 diabetes mellitus with other diabetic kidney complication: Secondary | ICD-10-CM | POA: Diagnosis not present

## 2013-11-21 DIAGNOSIS — E7800 Pure hypercholesterolemia, unspecified: Secondary | ICD-10-CM | POA: Diagnosis not present

## 2013-12-12 DIAGNOSIS — H16223 Keratoconjunctivitis sicca, not specified as Sjogren's, bilateral: Secondary | ICD-10-CM | POA: Diagnosis not present

## 2013-12-26 DIAGNOSIS — I10 Essential (primary) hypertension: Secondary | ICD-10-CM | POA: Diagnosis not present

## 2013-12-26 DIAGNOSIS — K219 Gastro-esophageal reflux disease without esophagitis: Secondary | ICD-10-CM | POA: Diagnosis not present

## 2013-12-26 DIAGNOSIS — E119 Type 2 diabetes mellitus without complications: Secondary | ICD-10-CM | POA: Diagnosis not present

## 2013-12-26 DIAGNOSIS — I259 Chronic ischemic heart disease, unspecified: Secondary | ICD-10-CM | POA: Diagnosis not present

## 2014-01-02 DIAGNOSIS — H16223 Keratoconjunctivitis sicca, not specified as Sjogren's, bilateral: Secondary | ICD-10-CM | POA: Diagnosis not present

## 2014-05-15 DIAGNOSIS — E119 Type 2 diabetes mellitus without complications: Secondary | ICD-10-CM | POA: Diagnosis not present

## 2014-05-15 DIAGNOSIS — M546 Pain in thoracic spine: Secondary | ICD-10-CM | POA: Diagnosis not present

## 2014-05-15 DIAGNOSIS — K219 Gastro-esophageal reflux disease without esophagitis: Secondary | ICD-10-CM | POA: Diagnosis not present

## 2014-05-15 DIAGNOSIS — I10 Essential (primary) hypertension: Secondary | ICD-10-CM | POA: Diagnosis not present

## 2014-05-15 DIAGNOSIS — E780 Pure hypercholesterolemia: Secondary | ICD-10-CM | POA: Diagnosis not present

## 2014-05-15 LAB — CBC AND DIFFERENTIAL
HCT: 43 % (ref 36–46)
Hemoglobin: 13.7 g/dL (ref 12.0–16.0)
Neutrophils Absolute: 72 /uL
Platelets: 238 10*3/uL (ref 150–399)
WBC: 5.6 10*3/mL

## 2014-05-15 LAB — BASIC METABOLIC PANEL
BUN: 16 mg/dL (ref 4–21)
CREATININE: 1.1 mg/dL (ref <=1.1)
GLUCOSE: 109 mg/dL
POTASSIUM: 4.7 mmol/L (ref 3.4–5.3)
Sodium: 142 mmol/L (ref 137–147)

## 2014-05-15 LAB — HEMOGLOBIN A1C: Hgb A1c MFr Bld: 6.3 % — AB (ref 4.0–6.0)

## 2014-05-16 DIAGNOSIS — E119 Type 2 diabetes mellitus without complications: Secondary | ICD-10-CM | POA: Diagnosis not present

## 2014-05-16 DIAGNOSIS — I2510 Atherosclerotic heart disease of native coronary artery without angina pectoris: Secondary | ICD-10-CM | POA: Diagnosis not present

## 2014-05-16 DIAGNOSIS — I10 Essential (primary) hypertension: Secondary | ICD-10-CM | POA: Diagnosis not present

## 2014-05-16 DIAGNOSIS — I209 Angina pectoris, unspecified: Secondary | ICD-10-CM | POA: Diagnosis not present

## 2014-05-29 DIAGNOSIS — E119 Type 2 diabetes mellitus without complications: Secondary | ICD-10-CM | POA: Diagnosis not present

## 2014-05-29 DIAGNOSIS — H16223 Keratoconjunctivitis sicca, not specified as Sjogren's, bilateral: Secondary | ICD-10-CM | POA: Diagnosis not present

## 2014-06-18 ENCOUNTER — Ambulatory Visit
Admit: 2014-06-18 | Disposition: A | Discharge status: Home / Self Care | Payer: Self-pay | Attending: Family Medicine | Admitting: Family Medicine

## 2014-06-18 DIAGNOSIS — I700 Atherosclerosis of aorta: Secondary | ICD-10-CM | POA: Diagnosis not present

## 2014-06-18 DIAGNOSIS — E119 Type 2 diabetes mellitus without complications: Secondary | ICD-10-CM | POA: Diagnosis not present

## 2014-06-18 DIAGNOSIS — I10 Essential (primary) hypertension: Secondary | ICD-10-CM | POA: Diagnosis not present

## 2014-06-18 DIAGNOSIS — J9811 Atelectasis: Secondary | ICD-10-CM | POA: Diagnosis not present

## 2014-06-18 DIAGNOSIS — N183 Chronic kidney disease, stage 3 (moderate): Secondary | ICD-10-CM | POA: Diagnosis not present

## 2014-06-25 DIAGNOSIS — I10 Essential (primary) hypertension: Secondary | ICD-10-CM | POA: Diagnosis not present

## 2014-06-25 DIAGNOSIS — E780 Pure hypercholesterolemia: Secondary | ICD-10-CM | POA: Diagnosis not present

## 2014-06-25 DIAGNOSIS — I2510 Atherosclerotic heart disease of native coronary artery without angina pectoris: Secondary | ICD-10-CM | POA: Diagnosis not present

## 2014-06-25 DIAGNOSIS — J189 Pneumonia, unspecified organism: Secondary | ICD-10-CM | POA: Diagnosis not present

## 2014-06-27 DIAGNOSIS — N183 Chronic kidney disease, stage 3 (moderate): Secondary | ICD-10-CM | POA: Diagnosis not present

## 2014-06-28 DIAGNOSIS — E119 Type 2 diabetes mellitus without complications: Secondary | ICD-10-CM | POA: Diagnosis not present

## 2014-07-12 ENCOUNTER — Ambulatory Visit
Admission: RE | Admit: 2014-07-12 | Discharge: 2014-07-12 | Disposition: A | Discharge status: Home / Self Care | Payer: Medicare Other | Source: Ambulatory Visit | Attending: Family Medicine | Admitting: Family Medicine

## 2014-07-12 ENCOUNTER — Other Ambulatory Visit: Payer: Self-pay | Admitting: Family Medicine

## 2014-07-12 DIAGNOSIS — R05 Cough: Secondary | ICD-10-CM | POA: Diagnosis not present

## 2014-07-12 DIAGNOSIS — J189 Pneumonia, unspecified organism: Secondary | ICD-10-CM

## 2014-07-12 DIAGNOSIS — E780 Pure hypercholesterolemia: Secondary | ICD-10-CM | POA: Diagnosis not present

## 2014-07-12 LAB — LIPID PANEL
CHOLESTEROL: 259 mg/dL — AB (ref 0–200)
HDL: 53 mg/dL (ref 35–70)
LDL Cholesterol: 166 mg/dL
LDl/HDL Ratio: 3.1
Triglycerides: 201 mg/dL — AB (ref 40–160)

## 2014-07-13 ENCOUNTER — Ambulatory Visit: Payer: Medicare Other | Attending: Family Medicine

## 2014-07-13 DIAGNOSIS — R0681 Apnea, not elsewhere classified: Secondary | ICD-10-CM | POA: Insufficient documentation

## 2014-07-13 DIAGNOSIS — I10 Essential (primary) hypertension: Secondary | ICD-10-CM | POA: Insufficient documentation

## 2014-07-13 DIAGNOSIS — G4730 Sleep apnea, unspecified: Secondary | ICD-10-CM | POA: Diagnosis not present

## 2014-07-13 DIAGNOSIS — R0683 Snoring: Secondary | ICD-10-CM | POA: Diagnosis not present

## 2014-07-17 DIAGNOSIS — J189 Pneumonia, unspecified organism: Secondary | ICD-10-CM | POA: Diagnosis not present

## 2014-07-17 DIAGNOSIS — K8020 Calculus of gallbladder without cholecystitis without obstruction: Secondary | ICD-10-CM | POA: Diagnosis not present

## 2014-07-17 DIAGNOSIS — E780 Pure hypercholesterolemia: Secondary | ICD-10-CM | POA: Diagnosis not present

## 2014-07-17 DIAGNOSIS — R7309 Other abnormal glucose: Secondary | ICD-10-CM | POA: Diagnosis not present

## 2014-07-18 DIAGNOSIS — F419 Anxiety disorder, unspecified: Secondary | ICD-10-CM | POA: Insufficient documentation

## 2014-07-18 DIAGNOSIS — E119 Type 2 diabetes mellitus without complications: Secondary | ICD-10-CM | POA: Insufficient documentation

## 2014-07-18 DIAGNOSIS — I2510 Atherosclerotic heart disease of native coronary artery without angina pectoris: Secondary | ICD-10-CM | POA: Insufficient documentation

## 2014-07-18 DIAGNOSIS — I10 Essential (primary) hypertension: Secondary | ICD-10-CM | POA: Insufficient documentation

## 2014-07-18 DIAGNOSIS — I209 Angina pectoris, unspecified: Secondary | ICD-10-CM | POA: Insufficient documentation

## 2014-07-18 DIAGNOSIS — M1990 Unspecified osteoarthritis, unspecified site: Secondary | ICD-10-CM | POA: Insufficient documentation

## 2014-07-18 DIAGNOSIS — I130 Hypertensive heart and chronic kidney disease with heart failure and stage 1 through stage 4 chronic kidney disease, or unspecified chronic kidney disease: Secondary | ICD-10-CM | POA: Insufficient documentation

## 2014-07-18 DIAGNOSIS — F5109 Other insomnia not due to a substance or known physiological condition: Secondary | ICD-10-CM | POA: Insufficient documentation

## 2014-08-18 ENCOUNTER — Encounter: Payer: Self-pay | Admitting: Registered Nurse

## 2014-08-18 ENCOUNTER — Ambulatory Visit
Admission: EM | Admit: 2014-08-18 | Discharge: 2014-08-18 | Disposition: A | Discharge status: Home / Self Care | Payer: Medicare Other | Attending: Family Medicine | Admitting: Family Medicine

## 2014-08-18 ENCOUNTER — Ambulatory Visit: Payer: Medicare Other

## 2014-08-18 DIAGNOSIS — Z7982 Long term (current) use of aspirin: Secondary | ICD-10-CM | POA: Insufficient documentation

## 2014-08-18 DIAGNOSIS — J069 Acute upper respiratory infection, unspecified: Secondary | ICD-10-CM | POA: Insufficient documentation

## 2014-08-18 DIAGNOSIS — J029 Acute pharyngitis, unspecified: Secondary | ICD-10-CM | POA: Insufficient documentation

## 2014-08-18 DIAGNOSIS — I10 Essential (primary) hypertension: Secondary | ICD-10-CM | POA: Diagnosis not present

## 2014-08-18 DIAGNOSIS — R05 Cough: Secondary | ICD-10-CM | POA: Diagnosis not present

## 2014-08-18 DIAGNOSIS — E119 Type 2 diabetes mellitus without complications: Secondary | ICD-10-CM | POA: Diagnosis not present

## 2014-08-18 DIAGNOSIS — H6593 Unspecified nonsuppurative otitis media, bilateral: Secondary | ICD-10-CM | POA: Insufficient documentation

## 2014-08-18 HISTORY — DX: Heart disease, unspecified: I51.9

## 2014-08-18 HISTORY — DX: Essential (primary) hypertension: I10

## 2014-08-18 HISTORY — DX: Unspecified osteoarthritis, unspecified site: M19.90

## 2014-08-18 HISTORY — DX: Type 2 diabetes mellitus without complications: E11.9

## 2014-08-18 LAB — RAPID STREP SCREEN (MED CTR MEBANE ONLY): Streptococcus, Group A Screen (Direct): NEGATIVE

## 2014-08-18 MED ORDER — AMOXICILLIN-POT CLAVULANATE 875-125 MG PO TABS: 1.0000 | ORAL_TABLET | Freq: Two times a day (BID) | ORAL | Status: DC

## 2014-08-18 MED ORDER — SALINE SPRAY 0.65 % NA SOLN: 2.0000 | NASAL | Status: DC

## 2014-08-18 MED ORDER — CETIRIZINE HCL 10 MG PO TABS: 10.0000 mg | ORAL_TABLET | Freq: Every day | ORAL | Status: AC

## 2014-08-18 MED ORDER — PSEUDOEPHEDRINE HCL 30 MG PO TABS: 30.0000 mg | ORAL_TABLET | Freq: Four times a day (QID) | ORAL | Status: DC | PRN

## 2014-08-18 NOTE — Discharge Instructions (Signed)
Allergic Rhinitis Allergic rhinitis is when the mucous membranes in the nose respond to allergens. Allergens are particles in the air that cause your body to have an allergic reaction. This causes you to release allergic antibodies. Through a chain of events, these eventually cause you to release histamine into the blood stream. Although meant to protect the body, it is this release of histamine that causes your discomfort, such as frequent sneezing, congestion, and an itchy, runny nose.  CAUSES  Seasonal allergic rhinitis (hay fever) is caused by pollen allergens that may come from grasses, trees, and weeds. Year-round allergic rhinitis (perennial allergic rhinitis) is caused by allergens such as house dust mites, pet dander, and mold spores.  SYMPTOMS   Nasal stuffiness (congestion).  Itchy, runny nose with sneezing and tearing of the eyes. DIAGNOSIS  Your health care provider can help you determine the allergen or allergens that trigger your symptoms. If you and your health care provider are unable to determine the allergen, skin or blood testing may be used. TREATMENT  Allergic rhinitis does not have a cure, but it can be controlled by:  Medicines and allergy shots (immunotherapy).  Avoiding the allergen. Hay fever may often be treated with antihistamines in pill or nasal spray forms. Antihistamines block the effects of histamine. There are over-the-counter medicines that may help with nasal congestion and swelling around the eyes. Check with your health care provider before taking or giving this medicine.  If avoiding the allergen or the medicine prescribed do not work, there are many new medicines your health care provider can prescribe. Stronger medicine may be used if initial measures are ineffective. Desensitizing injections can be used if medicine and avoidance does not work. Desensitization is when a patient is given ongoing shots until the body becomes less sensitive to the allergen.  Make sure you follow up with your health care provider if problems continue. HOME CARE INSTRUCTIONS It is not possible to completely avoid allergens, but you can reduce your symptoms by taking steps to limit your exposure to them. It helps to know exactly what you are allergic to so that you can avoid your specific triggers. SEEK MEDICAL CARE IF:   You have a fever.  You develop a cough that does not stop easily (persistent).  You have shortness of breath.  You start wheezing.  Symptoms interfere with normal daily activities. Document Released: 11/04/2000 Document Revised: 02/14/2013 Document Reviewed: 10/17/2012 [facility] Patient Information 2015 [place], [place]. This information is not intended to replace advice given to you by your health care provider. Make sure you discuss any questions you have with your health care provider. Upper Respiratory Infection, Adult An upper respiratory infection (URI) is also sometimes known as the common cold. The upper respiratory tract includes the nose, sinuses, throat, trachea, and bronchi. Bronchi are the airways leading to the lungs. Most people improve within 1 week, but symptoms can last up to 2 weeks. A residual cough may last even longer.  CAUSES Many different viruses can infect the tissues lining the upper respiratory tract. The tissues become irritated and inflamed and often become very moist. Mucus production is also common. A cold is contagious. You can easily spread the virus to others by oral contact. This includes kissing, sharing a glass, coughing, or sneezing. Touching your mouth or nose and then touching a surface, which is then touched by another person, can also spread the virus. SYMPTOMS  Symptoms typically develop 1 to 3 days after you come in contact with  a cold virus. Symptoms vary from person to person. They may include:  Runny nose.  Sneezing.  Nasal congestion.  Sinus irritation.  Sore throat.  Loss of voice  (laryngitis).  Cough.  Fatigue.  Muscle aches.  Loss of appetite.  Headache.  Low-grade fever. DIAGNOSIS  You might diagnose your own cold based on familiar symptoms, since most people get a cold 2 to 3 times a year. Your caregiver can confirm this based on your exam. Most importantly, your caregiver can check that your symptoms are not due to another disease such as strep throat, sinusitis, pneumonia, asthma, or epiglottitis. Blood tests, throat tests, and X-rays are not necessary to diagnose a common cold, but they may sometimes be helpful in excluding other more serious diseases. Your caregiver will decide if any further tests are required. RISKS AND COMPLICATIONS  You may be at risk for a more severe case of the common cold if you smoke cigarettes, have chronic heart disease (such as heart failure) or lung disease (such as asthma), or if you have a weakened immune system. The very young and very old are also at risk for more serious infections. Bacterial sinusitis, middle ear infections, and bacterial pneumonia can complicate the common cold. The common cold can worsen asthma and chronic obstructive pulmonary disease (COPD). Sometimes, these complications can require emergency medical care and may be life-threatening. PREVENTION  The best way to protect against getting a cold is to practice good hygiene. Avoid oral or hand contact with people with cold symptoms. Wash your hands often if contact occurs. There is no clear evidence that vitamin C, vitamin E, echinacea, or exercise reduces the chance of developing a cold. However, it is always recommended to get plenty of rest and practice good nutrition. TREATMENT  Treatment is directed at relieving symptoms. There is no cure. Antibiotics are not effective, because the infection is caused by a virus, not by bacteria. Treatment may include:  Increased fluid intake. Sports drinks offer valuable electrolytes, sugars, and fluids.  Breathing  heated mist or steam (vaporizer or shower).  Eating chicken soup or other clear broths, and maintaining good nutrition.  Getting plenty of rest.  Using gargles or lozenges for comfort.  Controlling fevers with ibuprofen or acetaminophen as directed by your caregiver.  Increasing usage of your inhaler if you have asthma. Zinc gel and zinc lozenges, taken in the first 24 hours of the common cold, can shorten the duration and lessen the severity of symptoms. Pain medicines may help with fever, muscle aches, and throat pain. A variety of non-prescription medicines are available to treat congestion and runny nose. Your caregiver can make recommendations and may suggest nasal or lung inhalers for other symptoms.  HOME CARE INSTRUCTIONS   Only take over-the-counter or prescription medicines for pain, discomfort, or fever as directed by your caregiver.  Use a warm mist humidifier or inhale steam from a shower to increase air moisture. This may keep secretions moist and make it easier to breathe.  Drink enough water and fluids to keep your urine clear or pale yellow.  Rest as needed.  Return to work when your temperature has returned to normal or as your caregiver advises. You may need to stay home longer to avoid infecting others. You can also use a face mask and careful hand washing to prevent spread of the virus. SEEK MEDICAL CARE IF:   After the first few days, you feel you are getting worse rather than better.  You need your  caregiver's advice about medicines to control symptoms.  You develop chills, worsening shortness of breath, or brown or red sputum. These may be signs of pneumonia.  You develop yellow or brown nasal discharge or pain in the face, especially when you bend forward. These may be signs of sinusitis.  You develop a fever, swollen neck glands, pain with swallowing, or white areas in the back of your throat. These may be signs of strep throat. SEEK IMMEDIATE MEDICAL CARE  IF:   You have a fever.  You develop severe or persistent headache, ear pain, sinus pain, or chest pain.  You develop wheezing, a prolonged cough, cough up blood, or have a change in your usual mucus (if you have chronic lung disease).  You develop sore muscles or a stiff neck. Document Released: 08/05/2000 Document Revised: 05/04/2011 Document Reviewed: December 08, 202015 [facility] Patient Information 2015 [place], [place]. This information is not intended to replace advice given to you by your health care provider. Make sure you discuss any questions you have with your health care provider. Viral Infections A virus is a type of germ. Viruses can cause:  Minor sore throats.  Aches and pains.  Headaches.  Runny nose.  Rashes.  Watery eyes.  Tiredness.  Coughs.  Loss of appetite.  Feeling sick to your stomach (nausea).  Throwing up (vomiting).  Watery poop (diarrhea). HOME CARE   Only take medicines as told by your doctor.  Drink enough water and fluids to keep your pee (urine) clear or pale yellow. Sports drinks are a good choice.  Get plenty of rest and eat healthy. Soups and broths with crackers or rice are fine. GET HELP RIGHT AWAY IF:   You have a very bad headache.  You have shortness of breath.  You have chest pain or neck pain.  You have an unusual rash.  You cannot stop throwing up.  You have watery poop that does not stop.  You cannot keep fluids down.  You or your child has a temperature by mouth above 102 F (38.9 C), not controlled by medicine.  Your baby is older than 3 months with a rectal temperature of 102 F (38.9 C) or higher.  Your baby is 58 months old or younger with a rectal temperature of 100.4 F (38 C) or higher. MAKE SURE YOU:   Understand these instructions.  Will watch this condition.  Will get help right away if you are not doing well or get worse. Document Released: 01/23/2008 Document Revised: 05/04/2011 Document Reviewed:  06/17/2010 [facility] Patient Information 2015 [place], [place]. This information is not intended to replace advice given to you by your health care provider. Make sure you discuss any questions you have with your health care provider. Pharyngitis Pharyngitis is redness, pain, and swelling (inflammation) of your pharynx.  CAUSES  Pharyngitis is usually caused by infection. Most of the time, these infections are from viruses (viral) and are part of a cold. However, sometimes pharyngitis is caused by bacteria (bacterial). Pharyngitis can also be caused by allergies. Viral pharyngitis may be spread from person to person by coughing, sneezing, and personal items or utensils (cups, forks, spoons, toothbrushes). Bacterial pharyngitis may be spread from person to person by more intimate contact, such as kissing.  SIGNS AND SYMPTOMS  Symptoms of pharyngitis include:   Sore throat.   Tiredness (fatigue).   Low-grade fever.   Headache.  Joint pain and muscle aches.  Skin rashes.  Swollen lymph nodes.  Plaque-like film on throat or tonsils (  often seen with bacterial pharyngitis). DIAGNOSIS  Your health care provider will ask you questions about your illness and your symptoms. Your medical history, along with a physical exam, is often all that is needed to diagnose pharyngitis. Sometimes, a rapid strep test is done. Other lab tests may also be done, depending on the suspected cause.  TREATMENT  Viral pharyngitis will usually get better in 3-4 days without the use of medicine. Bacterial pharyngitis is treated with medicines that kill germs (antibiotics).  HOME CARE INSTRUCTIONS   Drink enough water and fluids to keep your urine clear or pale yellow.   Only take over-the-counter or prescription medicines as directed by your health care provider:   If you are prescribed antibiotics, make sure you finish them even if you start to feel better.   Do not take aspirin.   Get lots of rest.    Gargle with 8 oz of salt water ( tsp of salt per 1 qt of water) as often as every 1-2 hours to soothe your throat.   Throat lozenges (if you are not at risk for choking) or sprays may be used to soothe your throat. SEEK MEDICAL CARE IF:   You have large, tender lumps in your neck.  You have a rash.  You cough up green, yellow-brown, or bloody spit. SEEK IMMEDIATE MEDICAL CARE IF:   Your neck becomes stiff.  You drool or are unable to swallow liquids.  You vomit or are unable to keep medicines or liquids down.  You have severe pain that does not go away with the use of recommended medicines.  You have trouble breathing (not caused by a stuffy nose). MAKE SURE YOU:   Understand these instructions.  Will watch your condition.  Will get help right away if you are not doing well or get worse. Document Released: 02/09/2005 Document Revised: 11/30/2012 Document Reviewed: 10/17/2012 [facility] Patient Information 2015 [place], [place]. This information is not intended to replace advice given to you by your health care provider. Make sure you discuss any questions you have with your health care provider. Otitis Media With Effusion Otitis media with effusion is the presence of fluid in the middle ear. This is a common problem in children, which often follows ear infections. It may be present for weeks or longer after the infection. Unlike an acute ear infection, otitis media with effusion refers only to fluid behind the ear drum and not infection. Children with repeated ear and sinus infections and allergy problems are the most likely to get otitis media with effusion. CAUSES  The most frequent cause of the fluid buildup is dysfunction of the eustachian tubes. These are the tubes that drain fluid in the ears to the back of the nose (nasopharynx). SYMPTOMS   The main symptom of this condition is hearing loss. As a result, you or your child may:  Listen to the TV at a loud  volume.  Not respond to questions.  Ask "what" often when spoken to.  Mistake or confuse one sound or word for another.  There may be a sensation of fullness or pressure but usually not pain. DIAGNOSIS   Your health care provider will diagnose this condition by examining you or your child's ears.  Your health care provider may test the pressure in you or your child's ear with a tympanometer.  A hearing test may be conducted if the problem persists. TREATMENT   Treatment depends on the duration and the effects of the effusion.  Antibiotics,  decongestants, nose drops, and cortisone-type drugs (tablets or nasal spray) may not be helpful.  Children with persistent ear effusions may have delayed language or behavioral problems. Children at risk for developmental delays in hearing, learning, and speech may require referral to a specialist earlier than children not at risk.  You or your child's health care provider may suggest a referral to an ear, nose, and throat surgeon for treatment. The following may help restore normal hearing:  Drainage of fluid.  Placement of ear tubes (tympanostomy tubes).  Removal of adenoids (adenoidectomy). HOME CARE INSTRUCTIONS   Avoid secondhand smoke.  Infants who are breastfed are less likely to have this condition.  Avoid feeding infants while they are lying flat.  Avoid known environmental allergens.  Avoid people who are sick. SEEK MEDICAL CARE IF:   Hearing is not better in 3 months.  Hearing is worse.  Ear pain.  Drainage from the ear.  Dizziness. MAKE SURE YOU:   Understand these instructions.  Will watch your condition.  Will get help right away if you are not doing well or get worse. Document Released: 03/19/2004 Document Revised: 06/26/2013 Document Reviewed: 09/06/2012 [facility] Patient Information 2015 [place] [employer] [employer], [place]. This information is not intended to replace advice given to you by your health care provider. Make  sure you discuss any questions you have with your health care provider.

## 2014-08-18 NOTE — ED Provider Notes (Addendum)
CSN: [id number]     Arrival date & time 08/18/14  0806 History   First MD Initiated Contact with Patient 08/18/14 [phone]     Chief Complaint  Patient presents with  . Sore Throat  . Otalgia   (Consider location/radiation/quality/duration/timing/severity/associated sxs/prior Treatment) HPI Comments: African [place] Female married here for evaluation of sore throat and left ear pain x 3 days.  Has tried two doses of tylenol, salt water gargles worst pain with swallowing.  Noticed hoarse voice, nonproductive cough.  Spouse sick with slightly sore throat also.  Last fasting finger stick check 2 days ago 129 am at home.  Had pneumonia last month with back pain and that was her main symptom.  Pain similar today.  Patient reported she exercises and cleans house has noticed that worsens her mid/upper back pain.  Patient is a 81 y.o. female presenting with pharyngitis and ear pain. The history is provided by the patient.  Sore Throat This is a new problem. The current episode started more than 2 days ago. The problem occurs constantly. The problem has been gradually worsening. Pertinent negatives include no chest pain, no abdominal pain, no headaches and no shortness of breath. The symptoms are aggravated by drinking, eating and swallowing. Nothing relieves the symptoms. She has tried rest and food for the symptoms. The treatment provided no relief.  Otalgia Associated symptoms: congestion and sore throat   Associated symptoms: no abdominal pain, no cough, no diarrhea, no ear discharge, no fever, no headaches, no hearing loss, no neck pain, no rash, no rhinorrhea, no tinnitus and no vomiting     Past Medical History  Diagnosis Date  . Hypertension   . Diabetes mellitus without complication   . Heart disease   . Arthritis    Past Surgical History  Procedure Laterality Date  . Coronary stent placement  06/17/2011  . Finger surgery      for trigger finger  . Tonsillectomy    . Cardiac surgery      Family History  Problem Relation Age of Onset  . Hypertension Mother   . Diabetes Mother   . Heart attack Father   . Colon cancer Sister    History  Substance Use Topics  . Smoking status: Never Smoker   . Smokeless tobacco: Not on file  . Alcohol Use: No   OB History    No data available     Review of Systems  Constitutional: Negative for fever, chills, diaphoresis, activity change, appetite change and fatigue.  HENT: Positive for congestion, ear pain, postnasal drip and sore throat. Negative for dental problem, drooling, ear discharge, facial swelling, hearing loss, mouth sores, nosebleeds, rhinorrhea, sinus pressure, sneezing, tinnitus, trouble swallowing and voice change.   Eyes: Negative for photophobia, pain, discharge, redness, itching and visual disturbance.  Respiratory: Negative for apnea, cough, choking, chest tightness, shortness of breath, wheezing and stridor.   Cardiovascular: Negative for chest pain, palpitations and leg swelling.  Gastrointestinal: Negative for nausea, vomiting, abdominal pain, diarrhea, constipation, blood in stool, abdominal distention, anal bleeding and rectal pain.  Endocrine: Negative for cold intolerance and heat intolerance.  Genitourinary: Negative for frequency, decreased urine volume and difficulty urinating.  Musculoskeletal: Positive for back pain. Negative for myalgias, joint swelling, arthralgias, gait problem, neck pain and neck stiffness.  Skin: Negative for color change, pallor, rash and wound.  Allergic/Immunologic: Positive for environmental allergies. Negative for food allergies.  Neurological: Negative for dizziness, tremors, seizures, syncope, facial asymmetry, speech difficulty, weakness, light-headedness, numbness and  headaches.  Hematological: Negative for adenopathy. Does not bruise/bleed easily.  Psychiatric/Behavioral: Negative for behavioral problems, confusion, sleep disturbance and agitation.    Allergies  Sulfa  antibiotics  Home Medications   Prior to Admission medications   Medication Sig Start Date End Date Taking? Authorizing Provider  aspirin 81 MG tablet Take by mouth.   Yes Historical Provider, MD  fluticasone (FLONASE) 50 MCG/ACT nasal spray Place into the nose. 08/29/13  Yes Historical Provider, MD  hydrochlorothiazide (HYDRODIURIL) 25 MG tablet Take by mouth. 08/29/13  Yes Historical Provider, MD  meloxicam (MOBIC) 7.5 MG tablet Take by mouth. 08/29/13  Yes Historical Provider, MD  metFORMIN (GLUCOPHAGE) 500 MG tablet Take by mouth. 08/29/13  Yes Historical Provider, MD  metoprolol succinate (TOPROL-XL) 50 MG 24 hr tablet Take by mouth. 08/29/13  Yes Historical Provider, MD  olmesartan (BENICAR) 20 MG tablet Take by mouth. 08/29/13  Yes Historical Provider, MD  omeprazole (PRILOSEC) 20 MG capsule Take by mouth. 08/29/13  Yes Historical Provider, MD  traZODone (DESYREL) 100 MG tablet Take by mouth. 08/29/13  Yes Historical Provider, MD  amoxicillin-clavulanate (AUGMENTIN) 875-125 MG per tablet Take 1 tablet by mouth every 12 (twelve) hours. 08/18/14   Olen Cordial, NP  cetirizine (ZYRTEC) 10 MG tablet Take 1 tablet (10 mg total) by mouth daily. 08/18/14   Olen Cordial, NP  ezetimibe (ZETIA) 10 MG tablet Take by mouth. 05/15/14   Historical Provider, MD  gabapentin (NEURONTIN) 100 MG capsule Take by mouth. 08/29/13   Historical Provider, MD  pseudoephedrine (SUDAFED) 30 MG tablet Take 1 tablet (30 mg total) by mouth every 6 (six) hours as needed for congestion (max 8 tabs per 24 hours). 08/18/14   Olen Cordial, NP  sodium chloride (OCEAN) 0.65 % SOLN nasal spray Place 2 sprays into both nostrils every 2 (two) hours while awake. 08/18/14   Olen Cordial, NP  sucralfate (CARAFATE) 1 G tablet Take by mouth. 12/26/13   Historical Provider, MD   BP 131/68 mmHg  Pulse 52  Temp(Src) 98.1 F (36.7 C) (Oral)  Ht 5\' 7"  (1.702 m)  Wt 180 lb (81.647 kg)  BMI 28.19 kg/m2  SpO2 100% Physical Exam    Constitutional: She is oriented to person, place, and time. Vital signs are normal. She appears well-developed and well-nourished. No distress.  HENT:  Head: Normocephalic and atraumatic.  Right Ear: Hearing, external ear and ear canal normal. A middle ear effusion is present.  Left Ear: Hearing, external ear and ear canal normal. A middle ear effusion is present.  Nose: Mucosal edema and rhinorrhea present. No nose lacerations, sinus tenderness, nasal deformity, septal deviation or nasal septal hematoma. No epistaxis.  No foreign bodies. Right sinus exhibits no maxillary sinus tenderness and no frontal sinus tenderness. Left sinus exhibits no maxillary sinus tenderness and no frontal sinus tenderness.  Mouth/Throat: Oropharynx is clear and moist. No oropharyngeal exudate.  Cobblestoning posterior pharynx, bilateral nasal turbinates with edema/erythema/clear discharge; bilateral tonsils edema/erythema 2+/4 bilaterally; bilateral TMs with air fluid level slight opacity, nasal congestion bilaterally  Eyes: Conjunctivae, EOM and lids are normal. Pupils are equal, round, and reactive to light. Right eye exhibits no discharge. Left eye exhibits no discharge. No scleral icterus.  Neck: Trachea normal and normal range of motion. Neck supple. No tracheal deviation present. No thyromegaly present.  Cardiovascular: Normal rate, regular rhythm, normal heart sounds and intact distal pulses.  Exam reveals no gallop and no friction rub.   No murmur heard. Pulmonary/Chest:  Effort normal. No accessory muscle usage or stridor. No tachypnea and no bradypnea. No respiratory distress. She has decreased breath sounds in the right lower field and the left lower field. She has no wheezes. She has no rhonchi. She has no rales. She exhibits no tenderness.  Abdominal: Soft. Bowel sounds are normal. She exhibits no distension and no mass. There is no tenderness. There is no rebound and no guarding.  Musculoskeletal: Normal  range of motion. She exhibits no edema or tenderness.       Cervical back: She exhibits spasm. She exhibits normal range of motion, no tenderness, no bony tenderness, no swelling, no edema, no deformity, no laceration, no pain and normal pulse.       Thoracic back: She exhibits normal range of motion, no tenderness, no bony tenderness, no swelling, no edema, no deformity, no laceration, no pain, no spasm and normal pulse.       Lumbar back: Normal. She exhibits normal range of motion, no tenderness, no bony tenderness, no swelling, no edema, no deformity, no laceration, no pain, no spasm and normal pulse.       Back:  Bilateral trapezius muscles tight  Lymphadenopathy:    She has no cervical adenopathy.  Neurological: She is alert and oriented to person, place, and time. No cranial nerve deficit. She exhibits normal muscle tone. Coordination normal.  Skin: Skin is warm, dry and intact. No rash noted. She is not diaphoretic. No erythema. No pallor.  Psychiatric: She has a normal mood and affect. Her speech is normal and behavior is normal. Judgment and thought content normal. Cognition and memory are normal.  Nursing note and vitals reviewed.   ED Course  Procedures (including critical care time) Labs Review Labs Reviewed  RAPID STREP SCREEN (NOT AT [facility])  CULTURE, GROUP A STREP [facility] ONLY)    Imaging Review Dg Chest 2 View  08/18/2014   CLINICAL DATA:  Upper back pain. Cough. History of pneumonia May 2016.  EXAM: CHEST  2 VIEW  COMPARISON:  07/12/2014  FINDINGS: Lungs are adequately inflated and otherwise clear. Cardiomediastinal silhouette is within normal. Minimal calcified plaque over the aortic arch. There are degenerative changes of the spine.  IMPRESSION: No active cardiopulmonary disease.   Electronically Signed   By: Marin Olp M.D.   On: 08/18/2014 09:19     MDM   1. URI, acute   2. Otitis media with effusion, bilateral   3. Pharyngitis   Plan: 1. Test/x-ray results and  diagnosis reviewed with patient 2. rx as per orders; risks, benefits, potential side effects reviewed with patient 3. Recommend supportive treatment with flonase, tylenol, sudafed, saline, flonase 4. F/u prn if symptoms worsen or don't improve  Discussed with patient no fluid/pneumonia noted on chest xray today.  Given copy of xray report.  Discussed postnasal drip causing cough and inflammation of upper airways need to get it dried up with flonase, zyrtec and nasal saline and if needed sudafed (last choice as will raise BP)  Viral upper respiratory infection: no evidence of invasive bacterial infection, non toxic and well hydrated.  This is most likely self limiting viral infection.  I do not see where any further testing or imaging is necessary at this time.   I will suggest supportive care, rest, good hygiene and encourage the patient to take adequate fluids.  The patient is to return to clinic or EMERGENCY ROOM if symptoms worsen or change significantly e.g. fever, lethargy, SOB, wheezing.  Exitcare handout on  common cold given to patient.  Patient verbalized agreement and understanding of treatment plan.   P2:  Hand washing and cover cough Given paper Rx for augmentin if worsening symptoms/sinus pressure/headache.  Restarting flonase 1 spray each nostril bid, zyrtec 10mg  po daily, sudafed if still having breakthrough rhinorrhea.  Nasal saline 2 sprays each nostril q2h while awake.  No evidence of bacterial infection, non toxic and well hydrated.  I do not see where any further testing or imaging is necessary at this time.   I will suggest supportive care, rest, good hygiene and encourage the patient to take adequate fluids.  The patient is to return to clinic or EMERGENCY ROOM if symptoms worsen or change significantly.  Exitcare handout on sinusitis given to patient.  Patient verbalized agreement and understanding of treatment plan and had no further questions at this time.   P2:  Hand washing and cover  cough Supportive treatment.   No evidence of invasive bacterial infection, non toxic and well hydrated.  This is most likely self limiting viral infection.  I do not see where any further testing or imaging is necessary at this time.   I will suggest supportive care, rest, good hygiene and encourage the patient to take adequate fluids.  The patient is to return to clinic or EMERGENCY ROOM if symptoms worsen or change significantly e.g. ear pain, fever, purulent discharge from ears or bleeding.  Exitcare handout on otitis media with effusion given to patient.  Patient verbalized agreement and understanding of treatment plan.   Usually no specific medical treatment is needed if a virus is causing the sore throat.  The throat most often gets better on its own within 5 to 7 days.  Antibiotic medicine does not cure viral pharyngitis.   For acute pharyngitis caused by bacteria, your healthcare provider will prescribe an antibiotic. Throat culture results will be available in 48 hours to call Tuesday or Wednesday and talk to nurse for results if they have not already called her for positive test results.  Augmentin Rx can be used for strep throat also. . Do not smoke.  Marland Kitchen Avoid secondhand smoke and other air pollutants.  . Use a cool mist humidifier to add moisture to the air.  . Get plenty of rest.  . You may want to rest your throat by talking less and eating a diet that is mostly liquid or soft for a day or two.   Marland Kitchen Nonprescription throat lozenges and mouthwashes should help relieve the soreness.   . Gargling with warm saltwater and drinking warm liquids may help.  (You can make a saltwater solution by adding 1/4 teaspoon of salt to 8 ounces, or 240 mL, of warm water.)  . A nonprescription pain reliever such as aspirin, acetaminophen, or ibuprofen may ease general aches and pains.   FOLLOW UP with clinic provider if no improvements in the next 7-10 days.  Patient verbalized understanding of instructions and  agreed with plan of care. P2:  Hand washing and diet.    Olen Cordial, NP 08/18/14 [place], NP 08/18/14 1812  02 Sep 2014 patient notified via telephone throat culture was negative normal.  Patient reported her symptoms have resolved and she is feeling much better.  She verbalized understanding of information and had no further questions at this time.  Olen Cordial, NP 09/02/14 1640

## 2014-08-18 NOTE — ED Notes (Signed)
Patient c/o sore throat / ear ache and coughing x 3 days.

## 2014-08-21 DIAGNOSIS — H01003 Unspecified blepharitis right eye, unspecified eyelid: Secondary | ICD-10-CM | POA: Diagnosis not present

## 2014-08-21 LAB — CULTURE, GROUP A STREP (THRC)

## 2014-08-24 ENCOUNTER — Other Ambulatory Visit: Payer: Self-pay | Admitting: Family Medicine

## 2014-08-24 NOTE — Telephone Encounter (Signed)
This is Dr. Alben Spittle patient. Please review. Thank you-aa

## 2014-09-15 ENCOUNTER — Other Ambulatory Visit: Payer: Self-pay | Admitting: Family Medicine

## 2014-09-19 DIAGNOSIS — R6884 Jaw pain: Secondary | ICD-10-CM | POA: Diagnosis not present

## 2014-09-25 ENCOUNTER — Ambulatory Visit: Payer: Self-pay | Admitting: Family Medicine

## 2014-09-27 ENCOUNTER — Telehealth: Payer: Self-pay | Admitting: Family Medicine

## 2014-09-29 IMAGING — CT CT HEAD WITHOUT CONTRAST
2 series · 15 of 30 positions shown, 19 images · non-contrast
Comparison: none

REASON FOR EXAM: lightheaded dizzy, hypertensive
COMMENTS:

PROCEDURE:     CT  - CT HEAD WITHOUT CONTRAST  - June 18, 2012  [DATE]
RESULT:     Comparison:  05/10/2010
TECHNIQUE: Multiple axial images from the foramen magnum to the vertex were
obtained without IV contrast.

[Series 2: without · axial · non-contrast · 0.39mm/px · z∈[-56,+64]mm · 13 of 29 slices shown, 17 images]
[im 3/29  brain]
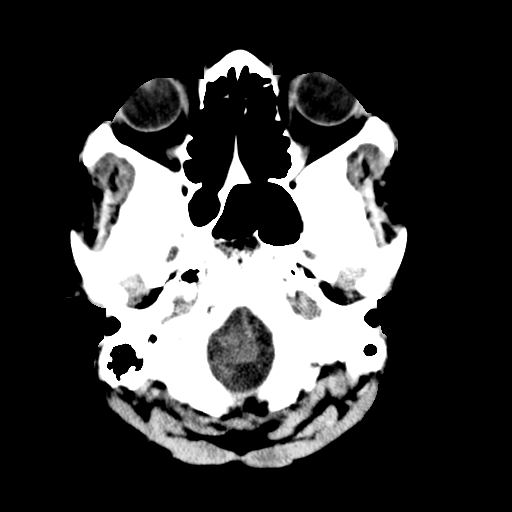
[im 3/29  bone]
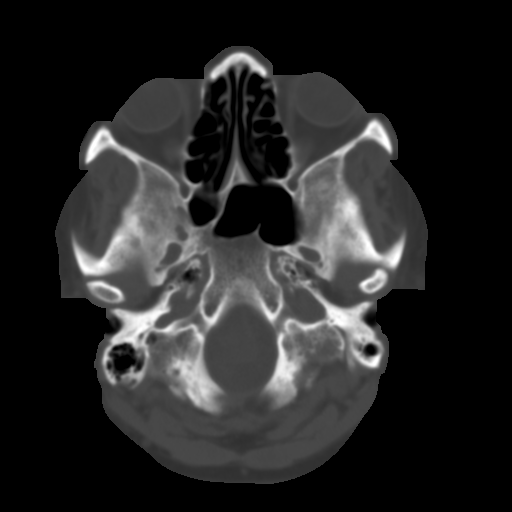
[im 5/29  brain]
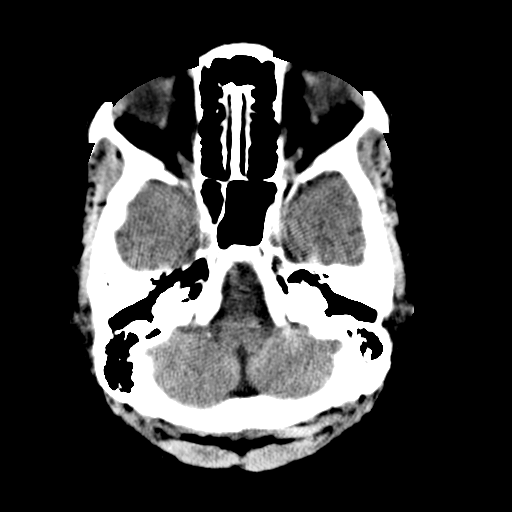
[im 7/29  brain]
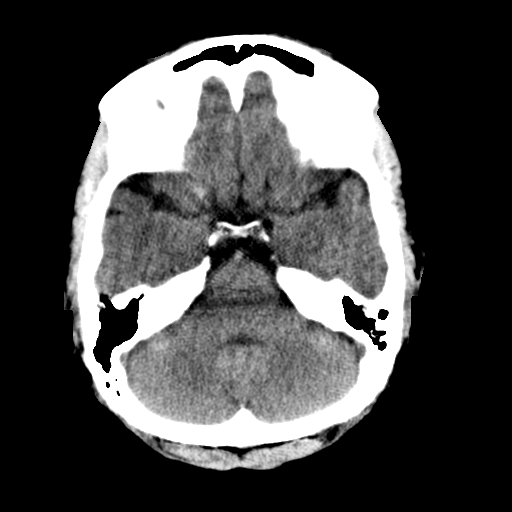
[im 9/29  brain]
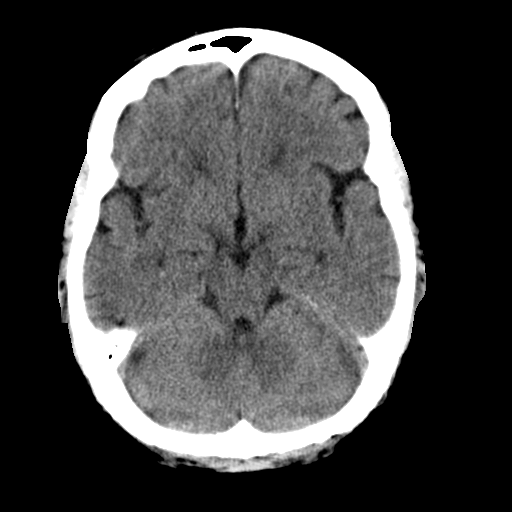
[im 11/29  brain]
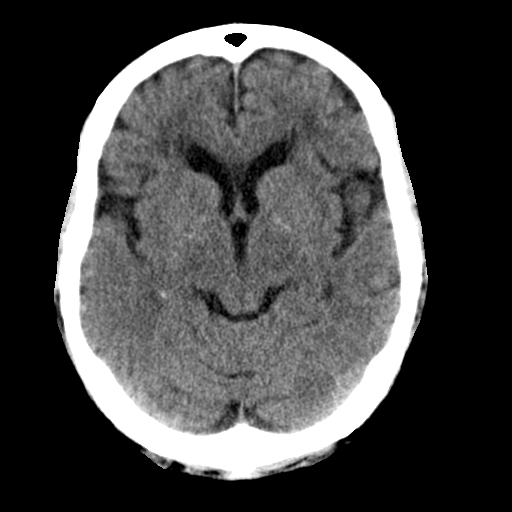
[im 11/29  bone]
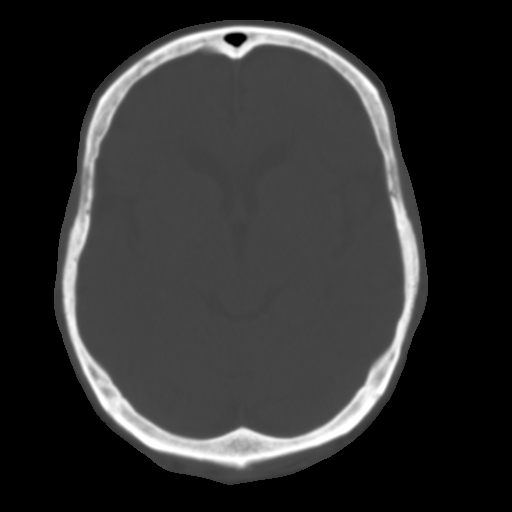
[im 13/29  brain]
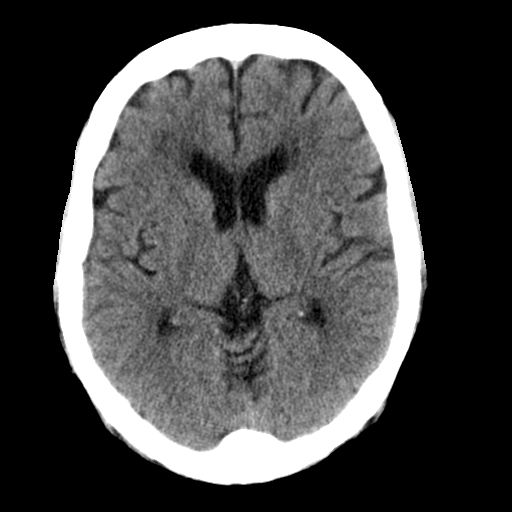
[im 15/29  brain]
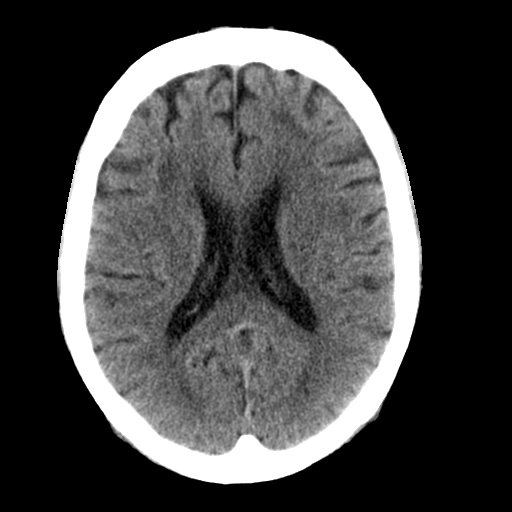
[im 17/29  brain]
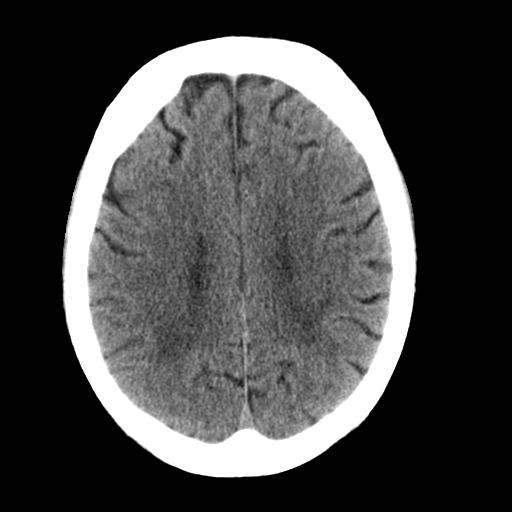
[im 19/29  brain]
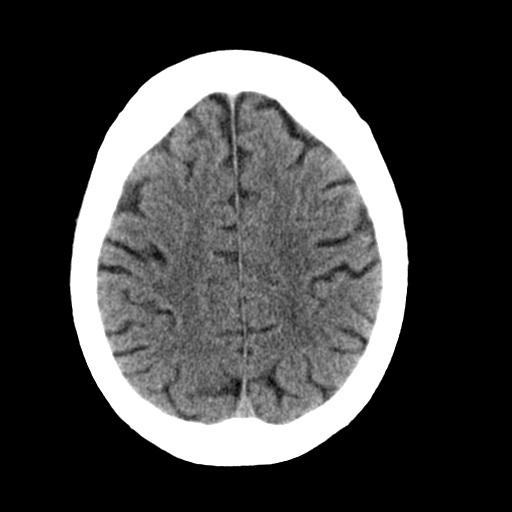
[im 19/29  bone]
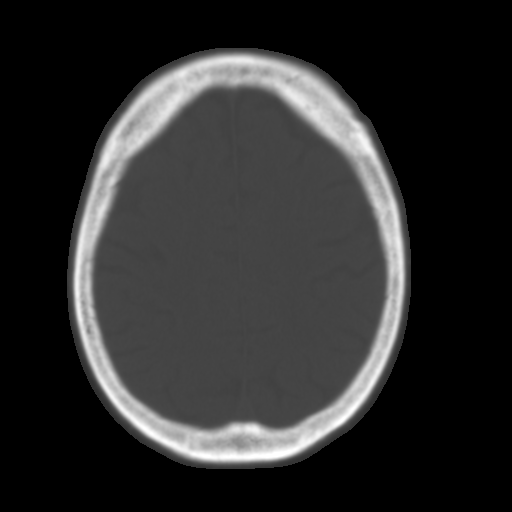
[im 21/29  brain]
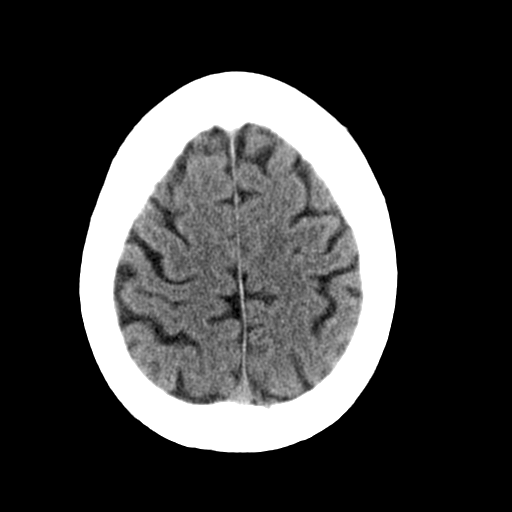
[im 23/29  brain]
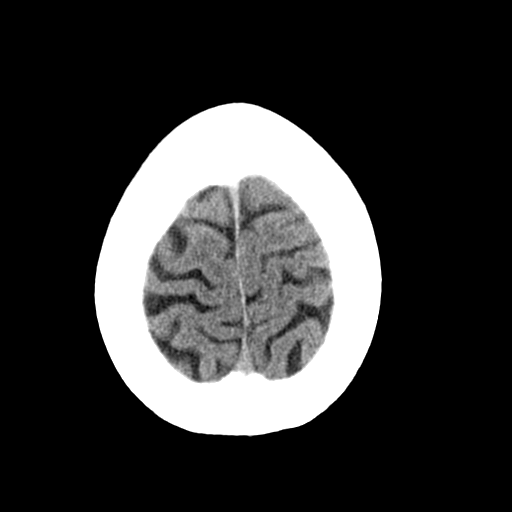
[im 25/29  brain]
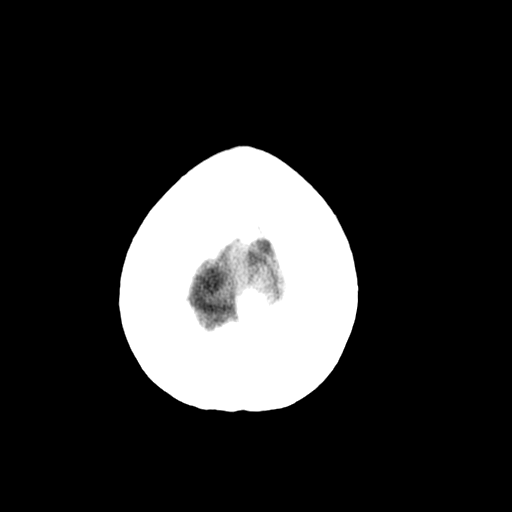
[im 27/29  brain]
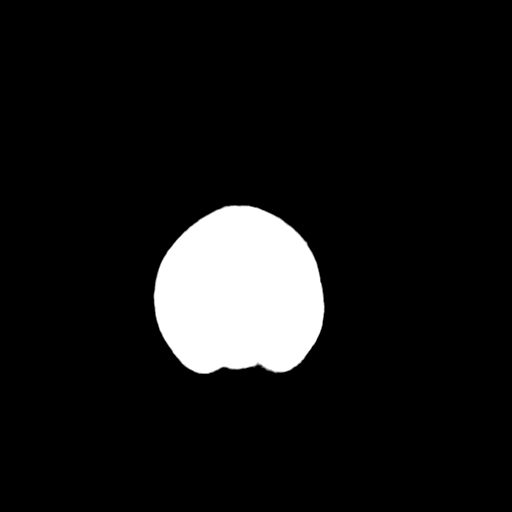
[im 27/29  bone]
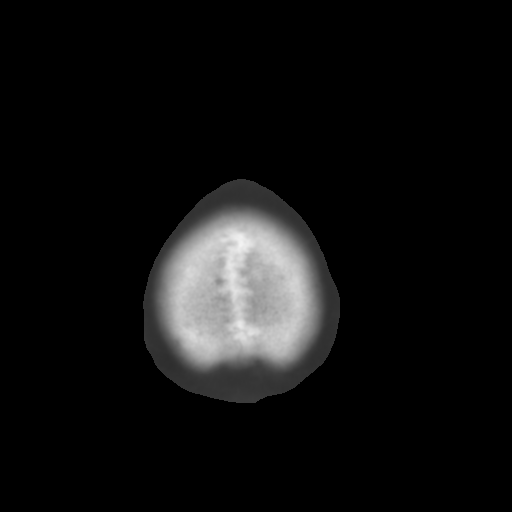

[Series 3: bone · axial · 0.39mm/px · z∈[-56,-36]mm · 2 of 29 slices shown]
[im 3/29  bone]
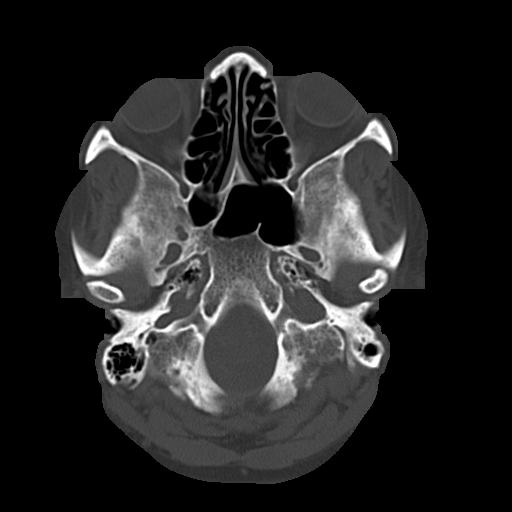
[im 7/29  bone]
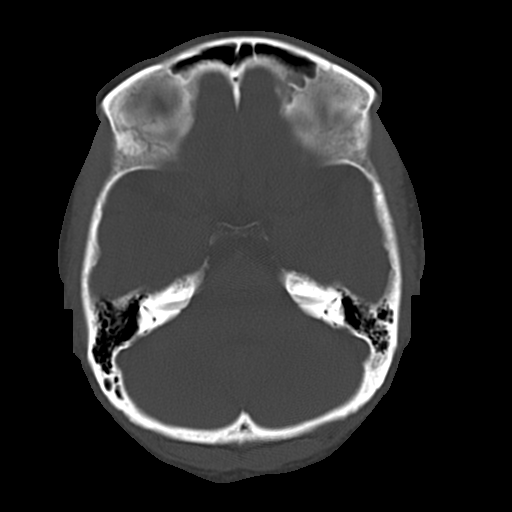

[15 of 30 positions shown; findings below may reference images not displayed]

FINDINGS: There is no evidence of mass effect, midline shift, or extra-axial fluid
collections.  There is no evidence of a space-occupying lesion or
intracranial hemorrhage. There is no evidence of a cortical-based area of
acute infarction. There is generalized cerebral atrophy. There is
periventricular white matter low attenuation likely secondary to
microangiopathy.

The ventricles and sulci are appropriate for the patient's age. The basal
cisterns are patent.

Visualized portions of the orbits are unremarkable. The visualized portions
of the paranasal sinuses and mastoid air cells are unremarkable.

The osseous structures are unremarkable.
IMPRESSION: No acute intracranial process.

[REDACTED]

## 2014-10-02 ENCOUNTER — Ambulatory Visit (INDEPENDENT_AMBULATORY_CARE_PROVIDER_SITE_OTHER): Payer: Medicare Other | Admitting: Family Medicine

## 2014-10-02 VITALS — BP 132/72 | HR 60 | Temp 97.8°F | Resp 16 | Wt 179.0 lb

## 2014-10-02 DIAGNOSIS — I889 Nonspecific lymphadenitis, unspecified: Secondary | ICD-10-CM | POA: Diagnosis not present

## 2014-10-02 DIAGNOSIS — M2662 Arthralgia of temporomandibular joint: Secondary | ICD-10-CM

## 2014-10-02 DIAGNOSIS — H9201 Otalgia, right ear: Secondary | ICD-10-CM | POA: Diagnosis not present

## 2014-10-02 DIAGNOSIS — K219 Gastro-esophageal reflux disease without esophagitis: Secondary | ICD-10-CM | POA: Diagnosis not present

## 2014-10-02 DIAGNOSIS — H109 Unspecified conjunctivitis: Secondary | ICD-10-CM

## 2014-10-02 DIAGNOSIS — R519 Headache, unspecified: Secondary | ICD-10-CM

## 2014-10-02 DIAGNOSIS — I10 Essential (primary) hypertension: Secondary | ICD-10-CM | POA: Diagnosis not present

## 2014-10-02 DIAGNOSIS — M26629 Arthralgia of temporomandibular joint, unspecified side: Secondary | ICD-10-CM

## 2014-10-02 DIAGNOSIS — E119 Type 2 diabetes mellitus without complications: Secondary | ICD-10-CM | POA: Diagnosis not present

## 2014-10-02 DIAGNOSIS — R51 Headache: Secondary | ICD-10-CM | POA: Diagnosis not present

## 2014-10-02 LAB — POCT GLYCOSYLATED HEMOGLOBIN (HGB A1C): Hemoglobin A1C: 6.7

## 2014-10-02 MED ORDER — DOXYCYCLINE HYCLATE 100 MG PO TABS: 100.0000 mg | ORAL_TABLET | Freq: Two times a day (BID) | ORAL | Status: DC

## 2014-10-02 MED ORDER — PREDNISONE 10 MG (21) PO TBPK: 10.0000 mg | ORAL_TABLET | Freq: Every day | ORAL | Status: DC

## 2014-10-02 NOTE — Progress Notes (Signed)
Patient ID: Valerie Foster, female   DOB: [birth year], 81 y.o.   MRN: [id number]   Anona Giovannini  MRN: [id number] DOB: [birth year]  Subjective:  HPI   1. Type 2 diabetes mellitus without complication Patient is a 81 year old female who presents for follow up of her diabetes.  Her last visit was on 07/17/14.  No changes were made at that visit.  Her last A1C was on 05/15/14 and was 6.3.  She reports her glucose readings have been low and she stopped her Metformin 3 weeks ago.  Since that time they have been running in the 120s. She has not actually had any hypoglycemic events.  She states she can tell when it is beginning to get low and she can control it by eating.    - POCT glycosylated hemoglobin (Hb A1C)  2. Essential hypertension Patient also here for follow up on her blood pressure.   She was last seen on 07/17/14 and her blood pressure at that time was 142/80.  She reports checking it outside of the office and it has been running about 130s over 70s.  She currently takes Benicar, Metoprolol, and HCTZ.  She reports that when she checks her blood pressure her heart rate is usually runs a little low at 50s-60s.  She reports she does not have any symptoms of dizziness but comp[lains that when she goes to the gym she has difficulty getting her heart rate to her target.  3. Gastroesophageal reflux disease, esophagitis presence not specified Patient is currently taking her Omeprazole as needed and she states she is not currentyl having any problem.   4. Ear pain, right Patient has been having pain in her right ear and was seen by Urgent care.  She was treated for TMJ with antibiotic and Prednisone.  Patient continues to have pain.    Patient Active Problem List   Diagnosis Date Noted  . Angina pectoris 07/18/2014  . Arthritis 07/18/2014  . CAD in native artery 07/18/2014  . Chronic anxiety 07/18/2014  . Diabetes 07/18/2014  . BP (high blood pressure) 07/18/2014  . Heart & renal disease,  hypertensive, with heart failure 07/18/2014  . Insomnia, matutinal 07/18/2014  . Benign neoplasm of colon 05/19/2013  . Acid reflux 05/19/2013  . Hypercholesteremia 05/19/2013  . Neuropathy 05/19/2013    Past Medical History  Diagnosis Date  . Hypertension   . Diabetes mellitus without complication   . Heart disease   . Arthritis     History   Social History  . Marital Status: Married    Spouse Name: N/A  . Number of Children: N/A  . Years of Education: N/A   Occupational History  . Not on file.   Social History Main Topics  . Smoking status: Never Smoker   . Smokeless tobacco: Not on file  . Alcohol Use: No  . Drug Use: No  . Sexual Activity: Not on file   Other Topics Concern  . Not on file   Social History Narrative    Outpatient Prescriptions Prior to Visit  Medication Sig Dispense Refill  . aspirin 81 MG tablet Take by mouth.    . cetirizine (ZYRTEC) 10 MG tablet Take 1 tablet (10 mg total) by mouth daily.    Marland Kitchen ezetimibe (ZETIA) 10 MG tablet Take by mouth.    . fluticasone (FLONASE) 50 MCG/ACT nasal spray Place into the nose.    . gabapentin (NEURONTIN) 100 MG capsule Take by mouth.    . hydrochlorothiazide (HYDRODIURIL)  25 MG tablet Take by mouth.    . meloxicam (MOBIC) 7.5 MG tablet Take by mouth.    . metoprolol succinate (TOPROL-XL) 50 MG 24 hr tablet TAKE 1 TABLET EVERY DAY 90 tablet 3  . olmesartan (BENICAR) 20 MG tablet Take by mouth.    Marland Kitchen omeprazole (PRILOSEC) 20 MG capsule Take by mouth.    . pseudoephedrine (SUDAFED) 30 MG tablet Take 1 tablet (30 mg total) by mouth every 6 (six) hours as needed for congestion (max 8 tabs per 24 hours). 30 tablet 0  . sodium chloride (OCEAN) 0.65 % SOLN nasal spray Place 2 sprays into both nostrils every 2 (two) hours while awake.  0  . traZODone (DESYREL) 100 MG tablet TAKE 1 TABLET BY MOUTH AT BEDTIME 90 tablet 3  . metFORMIN (GLUCOPHAGE) 500 MG tablet Take by mouth.    Marland Kitchen amoxicillin-clavulanate (AUGMENTIN)  875-125 MG per tablet Take 1 tablet by mouth every 12 (twelve) hours. 14 tablet 0  . sucralfate (CARAFATE) 1 G tablet Take by mouth.     No facility-administered medications prior to visit.    Allergies  Allergen Reactions  . Sulfa Antibiotics     Review of Systems  Constitutional: Negative.   Respiratory: Negative.   Cardiovascular: Negative.   Gastrointestinal: Negative for heartburn, nausea, vomiting, abdominal pain, diarrhea, constipation, blood in stool and melena.  Genitourinary: Negative for dysuria.  Neurological: Negative for dizziness and headaches.  Endo/Heme/Allergies: Negative for polydipsia.   Objective:  BP 132/72 mmHg  Pulse 60  Temp(Src) 97.8 F (36.6 C) (Oral)  Resp 16  Wt 179 lb (81.194 kg)  Physical Exam  Constitutional: She is well-developed, well-nourished, and in no distress.  HENT:  Head: Normocephalic and atraumatic.  Left Ear: External ear normal.  Mouth/Throat: Oropharynx is clear and moist.  Upper and lower dentures in place.  Tenderness between trajus and the TMJ.  Tenderness over the temporal artery.  Neck: Normal range of motion. Neck supple.  Cardiovascular: Normal rate, regular rhythm and normal heart sounds.   Pulmonary/Chest: Effort normal and breath sounds normal.    Assessment and Plan :    1. Type 2 diabetes mellitus without complication K8L today is 6.7.  Patient doing well off of the Metformin.  Will continue off of the Metformin. - POCT glycosylated hemoglobin (Hb A1C)  2. Essential hypertension Stable  3. Gastroesophageal reflux disease, esophagitis presence not specified Stable  4. Ear pain, right   5. TMJ arthralgia  - predniSONE (STERAPRED UNI-PAK 21 TAB) 10 MG (21) TBPK tablet; Take 1 tablet (10 mg total) by mouth daily.  Dispense: 21 tablet; Refill: 0  6. Temporal pain  - Sed Rate (ESR)  7. Lymphadenitis  - CBC With Differential/Platelet - doxycycline (VIBRA-TABS) 100 MG tablet; Take 1 tablet (100 mg  total) by mouth 2 (two) times daily.  Dispense: 20 tablet; Refill: 0  8. CAD without angina All risk factors treated as aggressively as possible and the statin intolerant patient. Consider new monthly injectable low extremely high price might be an issue for this patient and husband is on dialysis. I have done the exam and reviewed the above chart and it is accurate to the best of my knowledge.   Miguel Aschoff MD [facility] 10/02/2014 10:33 AM

## 2014-10-03 ENCOUNTER — Telehealth: Payer: Self-pay

## 2014-10-03 LAB — CBC WITH DIFFERENTIAL/PLATELET
BASOS: 0 %
Basophils Absolute: 0 10*3/uL (ref 0.0–0.2)
EOS (ABSOLUTE): 0.1 10*3/uL (ref 0.0–0.4)
EOS: 2 %
HEMATOCRIT: 39.9 % (ref 34.0–46.6)
HEMOGLOBIN: 13.3 g/dL (ref 11.1–15.9)
Immature Grans (Abs): 0 10*3/uL (ref 0.0–0.1)
Immature Granulocytes: 0 %
LYMPHS ABS: 1.1 10*3/uL (ref 0.7–3.1)
LYMPHS: 18 %
MCH: 27.5 pg (ref 26.6–33.0)
MCHC: 33.3 g/dL (ref 31.5–35.7)
MCV: 82 fL (ref 79–97)
MONOCYTES: 6 %
Monocytes Absolute: 0.4 10*3/uL (ref 0.1–0.9)
NEUTROS ABS: 4.2 10*3/uL (ref 1.4–7.0)
Neutrophils: 74 %
Platelets: 204 10*3/uL (ref 150–379)
RBC: 4.84 x10E6/uL (ref 3.77–5.28)
RDW: 14.2 % (ref 12.3–15.4)
WBC: 5.8 10*3/uL (ref 3.4–10.8)

## 2014-10-03 LAB — SEDIMENTATION RATE: SED RATE: 2 mm/h (ref 0–40)

## 2014-10-03 NOTE — Telephone Encounter (Signed)
Left message to call back  

## 2014-10-03 NOTE — Telephone Encounter (Signed)
Advised patient of lab results  

## 2014-10-03 NOTE — Telephone Encounter (Signed)
-----   Message from Jerrol Banana., MD sent at 10/03/2014  3:35 PM EDT ----- CBC and sedimentation rate normal.

## 2014-10-11 ENCOUNTER — Other Ambulatory Visit: Payer: Self-pay | Admitting: Family Medicine

## 2014-10-23 ENCOUNTER — Other Ambulatory Visit: Payer: Self-pay | Admitting: Family Medicine

## 2014-10-26 ENCOUNTER — Other Ambulatory Visit: Payer: Self-pay | Admitting: Family Medicine

## 2014-11-12 ENCOUNTER — Other Ambulatory Visit: Payer: Self-pay | Admitting: Family Medicine

## 2014-11-20 DIAGNOSIS — E784 Other hyperlipidemia: Secondary | ICD-10-CM | POA: Diagnosis not present

## 2014-11-20 DIAGNOSIS — K219 Gastro-esophageal reflux disease without esophagitis: Secondary | ICD-10-CM | POA: Diagnosis not present

## 2014-11-20 DIAGNOSIS — Z87898 Personal history of other specified conditions: Secondary | ICD-10-CM | POA: Diagnosis not present

## 2014-11-20 DIAGNOSIS — R42 Dizziness and giddiness: Secondary | ICD-10-CM | POA: Diagnosis not present

## 2014-11-20 DIAGNOSIS — I10 Essential (primary) hypertension: Secondary | ICD-10-CM | POA: Diagnosis not present

## 2014-11-20 DIAGNOSIS — I209 Angina pectoris, unspecified: Secondary | ICD-10-CM | POA: Diagnosis not present

## 2014-11-20 DIAGNOSIS — R000 Tachycardia, unspecified: Secondary | ICD-10-CM | POA: Diagnosis not present

## 2014-11-20 DIAGNOSIS — E119 Type 2 diabetes mellitus without complications: Secondary | ICD-10-CM | POA: Diagnosis not present

## 2014-11-20 DIAGNOSIS — I2510 Atherosclerotic heart disease of native coronary artery without angina pectoris: Secondary | ICD-10-CM | POA: Diagnosis not present

## 2015-01-01 DIAGNOSIS — H2513 Age-related nuclear cataract, bilateral: Secondary | ICD-10-CM | POA: Diagnosis not present

## 2015-01-22 DIAGNOSIS — H2513 Age-related nuclear cataract, bilateral: Secondary | ICD-10-CM | POA: Diagnosis not present

## 2015-01-24 ENCOUNTER — Encounter: Payer: Self-pay | Admitting: *Deleted

## 2015-01-28 ENCOUNTER — Ambulatory Visit: Payer: Medicare Other | Admitting: Anesthesiology

## 2015-01-28 ENCOUNTER — Encounter: Payer: Self-pay | Admitting: *Deleted

## 2015-01-28 ENCOUNTER — Encounter
Admission: RE | Disposition: A | Discharge status: Home / Self Care | Payer: Self-pay | Source: Ambulatory Visit | Attending: Ophthalmology

## 2015-01-28 ENCOUNTER — Ambulatory Visit
Admission: RE | Admit: 2015-01-28 | Discharge: 2015-01-28 | Disposition: A | Discharge status: Home / Self Care | Payer: Medicare Other | Source: Ambulatory Visit | Attending: Ophthalmology | Admitting: Ophthalmology

## 2015-01-28 DIAGNOSIS — Z882 Allergy status to sulfonamides status: Secondary | ICD-10-CM | POA: Diagnosis not present

## 2015-01-28 DIAGNOSIS — E119 Type 2 diabetes mellitus without complications: Secondary | ICD-10-CM | POA: Insufficient documentation

## 2015-01-28 DIAGNOSIS — H25012 Cortical age-related cataract, left eye: Secondary | ICD-10-CM | POA: Insufficient documentation

## 2015-01-28 DIAGNOSIS — I2510 Atherosclerotic heart disease of native coronary artery without angina pectoris: Secondary | ICD-10-CM | POA: Insufficient documentation

## 2015-01-28 DIAGNOSIS — F419 Anxiety disorder, unspecified: Secondary | ICD-10-CM | POA: Diagnosis not present

## 2015-01-28 DIAGNOSIS — K219 Gastro-esophageal reflux disease without esophagitis: Secondary | ICD-10-CM | POA: Diagnosis not present

## 2015-01-28 DIAGNOSIS — H2513 Age-related nuclear cataract, bilateral: Secondary | ICD-10-CM | POA: Diagnosis not present

## 2015-01-28 DIAGNOSIS — I10 Essential (primary) hypertension: Secondary | ICD-10-CM | POA: Insufficient documentation

## 2015-01-28 DIAGNOSIS — H2512 Age-related nuclear cataract, left eye: Secondary | ICD-10-CM | POA: Insufficient documentation

## 2015-01-28 HISTORY — DX: Pure hypercholesterolemia, unspecified: E78.00

## 2015-01-28 HISTORY — DX: Angina pectoris, unspecified: I20.9

## 2015-01-28 HISTORY — DX: Diverticulitis of intestine, part unspecified, without perforation or abscess without bleeding: K57.92

## 2015-01-28 HISTORY — DX: Cardiac arrhythmia, unspecified: I49.9

## 2015-01-28 HISTORY — PX: CATARACT EXTRACTION W/PHACO: SHX586

## 2015-01-28 LAB — GLUCOSE, CAPILLARY: GLUCOSE-CAPILLARY: 117 mg/dL — AB (ref 65–99)

## 2015-01-28 SURGERY — PHACOEMULSIFICATION, CATARACT, WITH IOL INSERTION
Anesthesia: Monitor Anesthesia Care | Site: Eye | Laterality: Left | Wound class: Clean

## 2015-01-28 MED ORDER — HYALURONIDASE HUMAN 150 UNIT/ML IJ SOLN
INTRAMUSCULAR | Status: AC
  Filled 2015-01-28: qty 1

## 2015-01-28 MED ORDER — OXYCODONE HCL 5 MG PO TABS: 5.0000 mg | ORAL_TABLET | Freq: Once | ORAL | Status: DC | PRN

## 2015-01-28 MED ORDER — LIDOCAINE HCL (PF) 4 % IJ SOLN
INTRAMUSCULAR | Status: DC | PRN
  Administered 2015-01-28: 4 mL via OPHTHALMIC

## 2015-01-28 MED ORDER — NA CHONDROIT SULF-NA HYALURON 40-17 MG/ML IO SOLN
INTRAOCULAR | Status: DC | PRN
  Administered 2015-01-28: 1 mL via INTRAOCULAR

## 2015-01-28 MED ORDER — ALFENTANIL 500 MCG/ML IJ INJ
INJECTION | INTRAMUSCULAR | Status: DC | PRN
  Administered 2015-01-28: 500 ug via INTRAVENOUS

## 2015-01-28 MED ORDER — CLINDAMYCIN PHOSPHATE 900 MG/50ML IV SOLN
INTRAVENOUS | Status: AC
  Filled 2015-01-28: qty 50

## 2015-01-28 MED ORDER — SODIUM CHLORIDE 0.9 % IV SOLN
INTRAVENOUS | Status: DC
  Administered 2015-01-28: 06:00:00 via INTRAVENOUS

## 2015-01-28 MED ORDER — TETRACAINE HCL 0.5 % OP SOLN
OPHTHALMIC | Status: AC
  Filled 2015-01-28: qty 2

## 2015-01-28 MED ORDER — PHENYLEPHRINE HCL 10 % OP SOLN
OPHTHALMIC | Status: AC
  Administered 2015-01-28: 1 [drp] via OPHTHALMIC
  Filled 2015-01-28: qty 5

## 2015-01-28 MED ORDER — EPINEPHRINE HCL 1 MG/ML IJ SOLN
INTRAOCULAR | Status: DC | PRN
  Administered 2015-01-28: 1 mL via OPHTHALMIC

## 2015-01-28 MED ORDER — CYCLOPENTOLATE HCL 2 % OP SOLN
OPHTHALMIC | Status: AC
  Administered 2015-01-28: 1 [drp] via OPHTHALMIC
  Filled 2015-01-28: qty 2

## 2015-01-28 MED ORDER — FENTANYL CITRATE (PF) 100 MCG/2ML IJ SOLN: 25.0000 ug | INTRAMUSCULAR | Status: DC | PRN

## 2015-01-28 MED ORDER — ONDANSETRON HCL 4 MG/2ML IJ SOLN
INTRAMUSCULAR | Status: DC | PRN
  Administered 2015-01-28: 4 mg via INTRAVENOUS

## 2015-01-28 MED ORDER — CEFUROXIME OPHTHALMIC INJECTION 1 MG/0.1 ML
INJECTION | OPHTHALMIC | Status: DC | PRN
Start: 2015-01-28 — End: 2015-01-28
  Administered 2015-01-28: .1 mL via INTRACAMERAL

## 2015-01-28 MED ORDER — CYCLOPENTOLATE HCL 2 % OP SOLN
1.0000 [drp] | OPHTHALMIC | Status: AC
  Administered 2015-01-28 (×4): 1 [drp] via OPHTHALMIC

## 2015-01-28 MED ORDER — OXYCODONE HCL 5 MG/5ML PO SOLN: 5.0000 mg | Freq: Once | ORAL | Status: DC | PRN

## 2015-01-28 MED ORDER — CARBACHOL 0.01 % IO SOLN
INTRAOCULAR | Status: DC | PRN
  Administered 2015-01-28: .5 mL via INTRAOCULAR

## 2015-01-28 MED ORDER — EPINEPHRINE HCL 1 MG/ML IJ SOLN
INTRAMUSCULAR | Status: AC
  Filled 2015-01-28: qty 1

## 2015-01-28 MED ORDER — MOXIFLOXACIN HCL 0.5 % OP SOLN
OPHTHALMIC | Status: DC | PRN
  Administered 2015-01-28: 1 [drp] via OPHTHALMIC

## 2015-01-28 MED ORDER — TETRACAINE HCL 0.5 % OP SOLN
OPHTHALMIC | Status: DC | PRN
  Administered 2015-01-28: 1 [drp] via OPHTHALMIC

## 2015-01-28 MED ORDER — PHENYLEPHRINE HCL 10 % OP SOLN
1.0000 [drp] | OPHTHALMIC | Status: AC
  Administered 2015-01-28 (×4): 1 [drp] via OPHTHALMIC

## 2015-01-28 MED ORDER — TRYPAN BLUE 0.06 % OP SOLN
OPHTHALMIC | Status: AC
  Filled 2015-01-28: qty 0.5

## 2015-01-28 MED ORDER — NA CHONDROIT SULF-NA HYALURON 40-17 MG/ML IO SOLN
INTRAOCULAR | Status: AC
  Filled 2015-01-28: qty 1

## 2015-01-28 MED ORDER — LIDOCAINE HCL (PF) 4 % IJ SOLN
INTRAMUSCULAR | Status: DC | PRN
  Administered 2015-01-28: .1 mL via OPHTHALMIC

## 2015-01-28 MED ORDER — MOXIFLOXACIN HCL 0.5 % OP SOLN
1.0000 [drp] | OPHTHALMIC | Status: AC
  Administered 2015-01-28 (×3): 1 [drp] via OPHTHALMIC

## 2015-01-28 MED ORDER — LIDOCAINE HCL (PF) 4 % IJ SOLN
INTRAMUSCULAR | Status: AC
  Filled 2015-01-28: qty 10

## 2015-01-28 MED ORDER — BUPIVACAINE HCL (PF) 0.75 % IJ SOLN
INTRAMUSCULAR | Status: AC
Start: 2015-01-28 — End: 2015-01-28
  Filled 2015-01-28: qty 10

## 2015-01-28 MED ORDER — CEFUROXIME OPHTHALMIC INJECTION 1 MG/0.1 ML
INJECTION | OPHTHALMIC | Status: AC
  Filled 2015-01-28: qty 0.1

## 2015-01-28 MED ORDER — MOXIFLOXACIN HCL 0.5 % OP SOLN
OPHTHALMIC | Status: AC
  Administered 2015-01-28: 1 [drp] via OPHTHALMIC
  Filled 2015-01-28: qty 3

## 2015-01-28 SURGICAL SUPPLY — 31 items
CANNULA ANT/CHMB 27G (MISCELLANEOUS) ×1 IMPLANT
CANNULA ANT/CHMB 27GA (MISCELLANEOUS) ×3 IMPLANT
CORD BIP STRL DISP 12FT (MISCELLANEOUS) ×3 IMPLANT
CUP MEDICINE 2OZ PLAST GRAD ST (MISCELLANEOUS) ×3 IMPLANT
DRAPE XRAY CASSETTE 23X24 (DRAPES) ×3 IMPLANT
ERASER HMR WETFIELD 18G (MISCELLANEOUS) ×3 IMPLANT
GLOVE BIO SURGEON STRL SZ8 (GLOVE) ×3 IMPLANT
GLOVE SURG LX 6.5 MICRO (GLOVE) ×2
GLOVE SURG LX 8.0 MICRO (GLOVE) ×2
GLOVE SURG LX STRL 6.5 MICRO (GLOVE) ×1 IMPLANT
GLOVE SURG LX STRL 8.0 MICRO (GLOVE) ×1 IMPLANT
GOWN STRL REUS W/ TWL LRG LVL3 (GOWN DISPOSABLE) ×1 IMPLANT
GOWN STRL REUS W/ TWL XL LVL3 (GOWN DISPOSABLE) ×1 IMPLANT
GOWN STRL REUS W/TWL LRG LVL3 (GOWN DISPOSABLE) ×3
GOWN STRL REUS W/TWL XL LVL3 (GOWN DISPOSABLE) ×3
LENS IOL ACRSF IQ ULTRA 23.5 (Intraocular Lens) IMPLANT
LENS IOL ACRYSOF IQ 23.5 (Intraocular Lens) ×3 IMPLANT
PACK CATARACT (MISCELLANEOUS) ×3 IMPLANT
PACK CATARACT DINGLEDEIN LX (MISCELLANEOUS) ×3 IMPLANT
PACK EYE AFTER SURG (MISCELLANEOUS) ×3 IMPLANT
SHLD EYE VISITEC  UNIV (MISCELLANEOUS) ×3 IMPLANT
SOL BSS BAG (MISCELLANEOUS) ×3
SOL PREP PVP 2OZ (MISCELLANEOUS) ×3
SOLUTION BSS BAG (MISCELLANEOUS) ×1 IMPLANT
SOLUTION PREP PVP 2OZ (MISCELLANEOUS) ×1 IMPLANT
SUT SILK 5-0 (SUTURE) ×3 IMPLANT
SYR 3ML LL SCALE MARK (SYRINGE) ×3 IMPLANT
SYR 5ML LL (SYRINGE) ×3 IMPLANT
SYR TB 1ML 27GX1/2 LL (SYRINGE) ×3 IMPLANT
WATER STERILE IRR 1000ML POUR (IV SOLUTION) ×3 IMPLANT
WIPE NON LINTING 3.25X3.25 (MISCELLANEOUS) ×3 IMPLANT

## 2015-01-28 NOTE — Anesthesia Preprocedure Evaluation (Signed)
Anesthesia Evaluation  Patient identified by MRN, date of birth, ID band Patient awake    Reviewed: Allergy & Precautions, H&P , NPO status , Patient's Chart, lab work & pertinent test results  History of Anesthesia Complications Negative for: history of anesthetic complications  Airway Mallampati: III  TM Distance: >3 FB Neck ROM: limited    Dental  (+) Poor Dentition, Missing   Pulmonary neg pulmonary ROS, neg shortness of breath,    Pulmonary exam normal breath sounds clear to auscultation       Cardiovascular Exercise Tolerance: Good hypertension, (-) angina+ CAD  Normal cardiovascular exam+ dysrhythmias  Rhythm:regular Rate:Normal     Neuro/Psych PSYCHIATRIC DISORDERS Anxiety negative neurological ROS     GI/Hepatic Neg liver ROS, GERD  Controlled,  Endo/Other  diabetes  Renal/GU Renal disease  negative genitourinary   Musculoskeletal negative musculoskeletal ROS (+)   Abdominal   Peds negative pediatric ROS (+)  Hematology negative hematology ROS (+)   Anesthesia Other Findings Past Medical History:   Hypertension                                                 Diabetes mellitus without complication (HCC)                 Heart disease                                                Arthritis                                                    Dysrhythmia                                                  Diverticulitis                                               Hypercholesteremia                                           Anginal pain ([place])                                          Past Surgical History:   CORONARY STENT PLACEMENT                         06/17/2011   FINGER SURGERY  Comment:for trigger finger   TONSILLECTOMY                                                 CARDIAC SURGERY                                              BMI    Body Mass  Index   28.02 kg/m 2      Reproductive/Obstetrics negative OB ROS                             Anesthesia Physical Anesthesia Plan  ASA: III  Anesthesia Plan: MAC   Post-op Pain Management:    Induction:   Airway Management Planned:   Additional Equipment:   Intra-op Plan:   Post-operative Plan:   Informed Consent: I have reviewed the patients History and Physical, chart, labs and discussed the procedure including the risks, benefits and alternatives for the proposed anesthesia with the patient or authorized representative who has indicated his/her understanding and acceptance.   Dental Advisory Given  Plan Discussed with: Anesthesiologist, CRNA and Surgeon  Anesthesia Plan Comments:         Anesthesia Quick Evaluation

## 2015-01-28 NOTE — Discharge Instructions (Signed)
AMBULATORY SURGERY  DISCHARGE INSTRUCTIONS   1) The drugs that you were given will stay in your system until tomorrow so for the next 24 hours you should not:  A) Drive an automobile B) Make any legal decisions C) Drink any alcoholic beverage   2) You may resume regular meals tomorrow.  Today it is better to start with liquids and gradually work up to solid foods.  You may eat anything you prefer, but it is better to start with liquids, then soup and crackers, and gradually work up to solid foods.   3) Please notify your doctor immediately if you have any unusual bleeding, trouble breathing, redness and pain at the surgery site, drainage, fever, or pain not relieved by medication.    4) Additional Instructions:    Eye Surgery Discharge Instructions  Expect mild scratchy sensation or mild soreness. DO NOT RUB YOUR EYE!  The day of surgery:  Minimal physical activity, but bed rest is not required  No reading, computer work, or close hand work  No bending, lifting, or straining.  May watch TV  For 24 hours:  No driving, legal decisions, or alcoholic beverages  Safety precautions  Eat anything you prefer: It is better to start with liquids, then soup then solid foods.  _____ Eye patch should be worn until postoperative exam tomorrow.  ____ Solar shield eyeglasses should be worn for comfort in the sunlight/patch while sleeping  Resume all regular medications including aspirin or Coumadin if these were discontinued prior to surgery. You may shower, bathe, shave, or wash your hair. Tylenol may be taken for mild discomfort.  Call your doctor if you experience significant pain, nausea, or vomiting, fever > 101 or other signs of infection. [phone] or [phone] Specific instructions:  Follow-up Information    Follow up In 1 day.   Why:  December 6 at 1:35pm        Please contact your physician with any problems or Same Day Surgery at [phone], Monday  through Friday 6 am to 4 pm, or [place] at [facility] number at [phone].

## 2015-01-28 NOTE — Interval H&P Note (Signed)
History and Physical Interval Note:  01/28/2015 7:26 AM  Valerie Foster  has presented today for surgery, with the diagnosis of nuclear sclerotic cataract left eye  The various methods of treatment have been discussed with the patient and family. After consideration of risks, benefits and other options for treatment, the patient has consented to  Procedure(s) with comments: CATARACT EXTRACTION PHACO AND INTRAOCULAR LENS PLACEMENT ([facility]) (Left) - [place] AP% CDE fluid pack lot # [id number] as a surgical intervention .  The patient's history has been reviewed, patient examined, no change in status, stable for surgery.  I have reviewed the patient's chart and labs.  Questions were answered to the patient's satisfaction.     Omnia Dollinger

## 2015-01-28 NOTE — H&P (Signed)
See scanned note.

## 2015-01-28 NOTE — Op Note (Signed)
Date of Surgery: 01/28/2015 Date of Dictation: 01/28/2015 8:18 AM Pre-operative Diagnosis:  Nuclear Sclerotic Cataract and Cortical Cataract left Eye Post-operative Diagnosis: same Procedure performed: Extra-capsular Cataract Extraction (ECCE) with placement of a posterior chamber intraocular lens (IOL) left Eye IOL:  Implant Name Type Inv. Item Serial No. Manufacturer Lot No. LRB No. Used  LENS IOL ACRYSOF IQ 23.5 - [id number] Intraocular Lens LENS IOL ACRYSOF IQ 23.5 [id number] ALCON   Left 1   Anesthesia: 2% Lidocaine and 4% Marcaine in a 50/50 mixture with 10 unites/ml of Hylenex given as a peribulbar Anesthesiologist: Anesthesiologist: Andria Frames, MD CRNA: Jonna Clark, CRNA Complications: none Estimated Blood Loss: less than 1 ml  Description of procedure:  The patient was given anesthesia and sedation via intravenous access. The patient was then prepped and draped in the usual fashion. A 25-gauge needle was bent for initiating the capsulorhexis. A 5-0 silk suture was placed through the conjunctiva superior and inferiorly to serve as bridle sutures. Hemostasis was obtained at the superior limbus using an eraser cautery. A partial thickness groove was made at the anterior surgical limbus with a 64 Beaver blade and this was dissected anteriorly with an Avaya. The anterior chamber was entered at 10 o'clock with a 1.0 mm paracentesis knife and through the lamellar dissection with a 2.6 mm Alcon keratome. Epi-Shugarcaine 0.5 CC [9 cc BSS Plus (Alcon), 3 cc 4% preservative-free lidocaine (Hospira) and 4 cc 1:1000 preservative-free, bisulfite-free epinephrine] was injected into the anterior chamber via the paracentesis tract. Epi-Shugarcaine 0.5 CC [9 cc BSS Plus (Alcon), 3 cc 4% preservative-free lidocaine (Hospira) and 4 cc 1:1000 preservative-free, bisulfite-free epinephrine] was injected into the anterior chamber via the paracentesis tract. DiscoVisc was injected to  replace the aqueous and a continuous tear curvilinear capsulorhexis was performed using a bent 25-gauge needle.  Balance salt on a syringe was used to perform hydro-dissection and phacoemulsification was carried out using a divide and conquer technique. Procedure(s) with comments: CATARACT EXTRACTION PHACO AND INTRAOCULAR LENS PLACEMENT ([facility]) (Left) - [place] 01:13 AP% 58.5 CDE 30.55 fluid pack lot # [id number]. Irrigation/aspiration was used to remove the residual cortex and the capsular bag was inflated with DiscoVisc. The intraocular lens was inserted into the capsular bag using a pre-loaded UltraSert Delivery System. Irrigation/aspiration was used to remove the residual DiscoVisc. The wound was inflated with balanced salt and checked for leaks. None were found. Miostat was injected via the paracentesis track and 0.1 ml of cefuroxime containing 1 mg of drug  was injected via the paracentesis track. The wound was checked for leaks again and none were found.   The bridal sutures were removed and two drops of Vigamox were placed on the eye. An eye shield was placed to protect the eye and the patient was discharged to the recovery area in good condition.   Stevan Eberwein MD

## 2015-01-28 NOTE — Anesthesia Postprocedure Evaluation (Signed)
Anesthesia Post Note  Patient: Valerie Foster  Procedure(s) Performed: Procedure(s) (LRB): CATARACT EXTRACTION PHACO AND INTRAOCULAR LENS PLACEMENT ([facility]) (Left)  Patient location during evaluation: PACU Anesthesia Type: MAC Level of consciousness: awake and alert Pain management: pain level controlled Vital Signs Assessment: post-procedure vital signs reviewed and stable Respiratory status: spontaneous breathing, nonlabored ventilation, respiratory function stable and patient connected to nasal cannula oxygen Cardiovascular status: stable and blood pressure returned to baseline Anesthetic complications: no    Last Vitals:  Filed Vitals:   01/28/15 0822 01/28/15 0840  BP: 184/74 151/65  Pulse: 55 55  Temp: 36.6 C   Resp: 16 16    Last Pain: There were no vitals filed for this visit.               Precious Haws Emmilee Reamer

## 2015-01-28 NOTE — Transfer of Care (Signed)
Immediate Anesthesia Transfer of Care Note  Patient: Valerie Foster  Procedure(s) Performed: Procedure(s) with comments: CATARACT EXTRACTION PHACO AND INTRAOCULAR LENS PLACEMENT ([facility]) (Left) - [place] 01:13 AP% 58.5 CDE 30.55 fluid pack lot # [id number]  Patient Location: PACU and Short Stay  Anesthesia Type:MAC  Level of Consciousness: awake, alert  and oriented  Airway & Oxygen Therapy: Patient Spontanous Breathing  Post-op Assessment: Report given to RN and Post -op Vital signs reviewed and stable  Post vital signs: Reviewed and stable  Last Vitals:  Filed Vitals:   01/28/15 0606 01/28/15 0821  BP: 174/76 184/74  Pulse: 57   Temp: 36.9 C 36.6 C  Resp: 14 16    Complications: No apparent anesthesia complications

## 2015-01-31 ENCOUNTER — Encounter: Payer: Self-pay | Admitting: Family Medicine

## 2015-01-31 ENCOUNTER — Ambulatory Visit (INDEPENDENT_AMBULATORY_CARE_PROVIDER_SITE_OTHER): Payer: Medicare Other | Admitting: Family Medicine

## 2015-01-31 VITALS — BP 124/80 | HR 52 | Temp 97.4°F | Resp 16 | Wt 183.0 lb

## 2015-01-31 DIAGNOSIS — Z23 Encounter for immunization: Secondary | ICD-10-CM | POA: Diagnosis not present

## 2015-01-31 DIAGNOSIS — E119 Type 2 diabetes mellitus without complications: Secondary | ICD-10-CM | POA: Diagnosis not present

## 2015-01-31 DIAGNOSIS — K219 Gastro-esophageal reflux disease without esophagitis: Secondary | ICD-10-CM

## 2015-01-31 DIAGNOSIS — M1990 Unspecified osteoarthritis, unspecified site: Secondary | ICD-10-CM | POA: Diagnosis not present

## 2015-01-31 DIAGNOSIS — I10 Essential (primary) hypertension: Secondary | ICD-10-CM | POA: Diagnosis not present

## 2015-01-31 DIAGNOSIS — F5109 Other insomnia not due to a substance or known physiological condition: Secondary | ICD-10-CM

## 2015-01-31 LAB — POCT GLYCOSYLATED HEMOGLOBIN (HGB A1C): Hemoglobin A1C: 6.6

## 2015-01-31 NOTE — Progress Notes (Signed)
Patient ID: Valerie Foster, female   DOB: Oct 09, 1933, 81 y.o.   MRN: [id number]    Subjective:  HPI  Hypertension follow up: Patient was last seen in August. She is checking her B/P and readings are around 140/83 lately. BP Readings from Last 3 Encounters:  01/31/15 124/80  01/28/15 151/65  10/02/14 132/72   Diabetes follow up: Lab Results  Component Value Date   HGBA1C 6.7 10/02/2014  She checks her sugar and readings around 120-160. Not taking Metformin every day, if she is having lower reading she does not take it and notices that she has more frequent urination when she is on medication.  GERD follow up: No trouble with this. She does not take Omeprazole often.  She is having hard time staying asleep, she wakes up because of nightmares and sometimes without having bad dreams. She did try Trazodone but it made her feel unsteady and has hard time focusing. She has tried Advil PM and that helps sometimes.  Prior to Admission medications   Medication Sig Start Date End Date Taking? Authorizing Provider  aspirin 81 MG tablet Take by mouth.    Historical Provider, MD  BENICAR 20 MG tablet TAKE 1 TABLET BY MOUTH AT BEDTIME 10/26/14   Jerrol Banana., MD  cetirizine (ZYRTEC) 10 MG tablet Take 1 tablet (10 mg total) by mouth daily. Patient not taking: Reported on 01/28/2015 08/18/14   Olen Cordial, NP  doxycycline (VIBRA-TABS) 100 MG tablet Take 1 tablet (100 mg total) by mouth 2 (two) times daily. Patient not taking: Reported on 01/28/2015 10/02/14   Jerrol Banana., MD  ezetimibe (ZETIA) 10 MG tablet Take by mouth. 05/15/14   Historical Provider, MD  fluticasone (FLONASE) 50 MCG/ACT nasal spray Place into the nose. 08/29/13   Historical Provider, MD  gabapentin (NEURONTIN) 100 MG capsule Take by mouth. 08/29/13   Historical Provider, MD  hydrochlorothiazide (HYDRODIURIL) 25 MG tablet TAKE 1 TABLET EVERY DAY 11/12/14   Jerrol Banana., MD  meloxicam (MOBIC) 7.5 MG tablet Take by  mouth. 08/29/13   Historical Provider, MD  metFORMIN (GLUCOPHAGE) 500 MG tablet TAKE 1 TABLET DAILY 10/23/14   Jerrol Banana., MD  metoprolol succinate (TOPROL-XL) 50 MG 24 hr tablet TAKE 1 TABLET EVERY DAY 09/16/14   Jerrol Banana., MD  omeprazole (PRILOSEC) 20 MG capsule Take by mouth. 08/29/13   Historical Provider, MD  predniSONE (STERAPRED UNI-PAK 21 TAB) 10 MG (21) TBPK tablet Take 1 tablet (10 mg total) by mouth daily. Patient not taking: Reported on 01/28/2015 10/02/14   Jerrol Banana., MD  pseudoephedrine (SUDAFED) 30 MG tablet Take 1 tablet (30 mg total) by mouth every 6 (six) hours as needed for congestion (max 8 tabs per 24 hours). Patient not taking: Reported on 01/28/2015 08/18/14   Olen Cordial, NP  sodium chloride (OCEAN) 0.65 % SOLN nasal spray Place 2 sprays into both nostrils every 2 (two) hours while awake. Patient not taking: Reported on 01/28/2015 08/18/14   Olen Cordial, NP  traZODone (DESYREL) 100 MG tablet TAKE 1 TABLET BY MOUTH AT BEDTIME 08/24/14   Margarita Rana, MD    Patient Active Problem List   Diagnosis Date Noted  . Angina pectoris ([place]) 07/18/2014  . Arthritis 07/18/2014  . CAD in native artery 07/18/2014  . Chronic anxiety 07/18/2014  . Diabetes ([place]) 07/18/2014  . BP (high blood pressure) 07/18/2014  . Heart & renal disease, hypertensive, with heart failure (  [place]) 07/18/2014  . Insomnia, matutinal 07/18/2014  . Benign neoplasm of colon 05/19/2013  . Acid reflux 05/19/2013  . Hypercholesteremia 05/19/2013  . Neuropathy ([place]) 05/19/2013    Past Medical History  Diagnosis Date  . Hypertension   . Diabetes mellitus without complication ([place])   . Heart disease   . Arthritis   . Dysrhythmia   . Diverticulitis   . Hypercholesteremia   . Anginal pain [facility])     Social History   Social History  . Marital Status: Married    Spouse Name: N/A  . Number of Children: N/A  . Years of Education: N/A   Occupational History  . Not on  file.   Social History Main Topics  . Smoking status: Never Smoker   . Smokeless tobacco: Never Used  . Alcohol Use: No  . Drug Use: No  . Sexual Activity: Not on file   Other Topics Concern  . Not on file   Social History Narrative    Allergies  Allergen Reactions  . Sulfa Antibiotics     Review of Systems  Constitutional: Negative.   Respiratory: Negative.   Cardiovascular: Negative.   Gastrointestinal: Negative.   Musculoskeletal: Positive for myalgias and back pain.  Psychiatric/Behavioral: The patient has insomnia.     Immunization History  Administered Date(s) Administered  . Pneumococcal Conjugate-13 08/29/2013  . Pneumococcal Polysaccharide-23 01/28/2006   Objective:  BP 124/80 mmHg  Pulse 52  Temp(Src) 97.4 F (36.3 C)  Resp 16  Wt 183 lb (83.008 kg)  Physical Exam  Constitutional: She is oriented to person, place, and time and well-developed, well-nourished, and in no distress.  HENT:  Head: Normocephalic and atraumatic.  Eyes: Conjunctivae are normal. Pupils are equal, round, and reactive to light.  Neck: Normal range of motion. Neck supple.  Cardiovascular: Normal rate, regular rhythm, normal heart sounds and intact distal pulses.   No murmur heard. Pulmonary/Chest: Effort normal and breath sounds normal. No respiratory distress. She has no wheezes.  Abdominal: Soft.  Musculoskeletal: Normal range of motion. She exhibits no edema or tenderness.  Neurological: She is alert and oriented to person, place, and time. Gait normal.  Skin: Skin is warm and dry.  Psychiatric: Mood, memory, affect and judgment normal.    Lab Results  Component Value Date   WBC 5.8 10/02/2014   HGB 13.7 05/15/2014   HCT 39.9 10/02/2014   PLT 238 05/15/2014   GLUCOSE 109* 06/18/2012   CHOL 259* 07/12/2014   TRIG 201* 07/12/2014   HDL 53 07/12/2014   LDLCALC 166 07/12/2014   TSH 3.00 06/08/2013   HGBA1C 6.7 10/02/2014    CMP     Component Value Date/Time   NA  142 05/15/2014   NA 141 06/18/2012 0206   K 4.7 05/15/2014   K 3.7 06/18/2012 0206   CL 105 06/18/2012 0206   CO2 31 06/18/2012 0206   GLUCOSE 109* 06/18/2012 0206   BUN 16 05/15/2014   BUN 20* 06/18/2012 0206   CREATININE 1.1 05/15/2014   CREATININE 1.13 06/18/2012 0206   CALCIUM 9.6 06/18/2012 0206   PROT 7.4 06/18/2012 0206   ALBUMIN 4.0 06/18/2012 0206   AST 20 06/18/2012 0206   AST 16 06/14/2007   ALT 19 06/18/2012 0206   ALT 17 06/14/2007   ALKPHOS 74 06/18/2012 0206   ALKPHOS 71 06/14/2007   BILITOT 0.4 06/18/2012 0206   GFRNONAA 47* 06/18/2012 0206   GFRAA 54* 06/18/2012 0206    Assessment and Plan :  1. Essential hypertension Stable. Follow.  2. Gastroesophageal reflux disease without esophagitis Stable.  3. Type 2 diabetes mellitus without complication, without long-term current use of insulin (HCC) A1C 6.6. Stable. Due to side effects patient is having will stop Metformin for now and follow, may need to re start this. - POCT HgB A1C  4. Insomnia  Having side effects with taking Trazodone, advised patient to just take Advil PM for now and if need to will restart medication later.  5. Arthritis Stable.  6. Need for influenza vaccination - Flu vaccine HIGH DOSE PF (Fluzone High dose)  I have done the exam and reviewed the above chart and it is accurate to the best of my knowledge.  Patient was seen and examined by Dr. Eulas Post and note was scribed by Theressa Millard, RMA.   Miguel Aschoff MD [facility] 01/31/2015 2:07 PM

## 2015-02-26 DIAGNOSIS — H2511 Age-related nuclear cataract, right eye: Secondary | ICD-10-CM | POA: Diagnosis not present

## 2015-02-27 ENCOUNTER — Encounter: Payer: Self-pay | Admitting: *Deleted

## 2015-03-03 NOTE — H&P (Signed)
See scanned H&P

## 2015-03-04 ENCOUNTER — Ambulatory Visit: Admission: RE | Admit: 2015-03-04 | Payer: Medicare Other | Source: Ambulatory Visit | Admitting: Ophthalmology

## 2015-03-04 ENCOUNTER — Encounter: Admission: RE | Payer: Self-pay | Source: Ambulatory Visit

## 2015-03-04 HISTORY — DX: Atherosclerotic heart disease of native coronary artery without angina pectoris: I25.10

## 2015-03-04 HISTORY — DX: Palpitations: R00.2

## 2015-03-04 SURGERY — PHACOEMULSIFICATION, CATARACT, WITH IOL INSERTION
Anesthesia: Choice | Laterality: Right

## 2015-03-04 MED ORDER — SODIUM CHLORIDE 0.9 % IV SOLN: INTRAVENOUS | Status: DC

## 2015-03-04 MED ORDER — MOXIFLOXACIN HCL 0.5 % OP SOLN: 1.0000 [drp] | OPHTHALMIC | Status: DC | PRN

## 2015-03-04 MED ORDER — CYCLOPENTOLATE HCL 2 % OP SOLN: 1.0000 [drp] | OPHTHALMIC | Status: DC | PRN

## 2015-03-04 MED ORDER — PHENYLEPHRINE HCL 10 % OP SOLN: 1.0000 [drp] | OPHTHALMIC | Status: DC | PRN

## 2015-03-18 ENCOUNTER — Ambulatory Visit: Payer: Medicare Other | Admitting: Certified Registered"

## 2015-03-18 ENCOUNTER — Encounter: Payer: Self-pay | Admitting: *Deleted

## 2015-03-18 ENCOUNTER — Ambulatory Visit
Admission: RE | Admit: 2015-03-18 | Discharge: 2015-03-18 | Disposition: A | Discharge status: Home / Self Care | Payer: Medicare Other | Source: Ambulatory Visit | Attending: Ophthalmology | Admitting: Ophthalmology

## 2015-03-18 ENCOUNTER — Encounter
Admission: RE | Disposition: A | Discharge status: Home / Self Care | Payer: Self-pay | Source: Ambulatory Visit | Attending: Ophthalmology

## 2015-03-18 DIAGNOSIS — E7800 Pure hypercholesterolemia, unspecified: Secondary | ICD-10-CM | POA: Diagnosis not present

## 2015-03-18 DIAGNOSIS — H2511 Age-related nuclear cataract, right eye: Secondary | ICD-10-CM | POA: Diagnosis not present

## 2015-03-18 DIAGNOSIS — Z882 Allergy status to sulfonamides status: Secondary | ICD-10-CM | POA: Insufficient documentation

## 2015-03-18 DIAGNOSIS — E119 Type 2 diabetes mellitus without complications: Secondary | ICD-10-CM | POA: Insufficient documentation

## 2015-03-18 DIAGNOSIS — Z9842 Cataract extraction status, left eye: Secondary | ICD-10-CM | POA: Diagnosis not present

## 2015-03-18 DIAGNOSIS — Z955 Presence of coronary angioplasty implant and graft: Secondary | ICD-10-CM | POA: Diagnosis not present

## 2015-03-18 DIAGNOSIS — I10 Essential (primary) hypertension: Secondary | ICD-10-CM | POA: Diagnosis not present

## 2015-03-18 DIAGNOSIS — H25011 Cortical age-related cataract, right eye: Secondary | ICD-10-CM | POA: Diagnosis not present

## 2015-03-18 DIAGNOSIS — H269 Unspecified cataract: Secondary | ICD-10-CM | POA: Diagnosis present

## 2015-03-18 DIAGNOSIS — Z79899 Other long term (current) drug therapy: Secondary | ICD-10-CM | POA: Diagnosis not present

## 2015-03-18 DIAGNOSIS — I2510 Atherosclerotic heart disease of native coronary artery without angina pectoris: Secondary | ICD-10-CM | POA: Insufficient documentation

## 2015-03-18 DIAGNOSIS — Z791 Long term (current) use of non-steroidal anti-inflammatories (NSAID): Secondary | ICD-10-CM | POA: Insufficient documentation

## 2015-03-18 HISTORY — PX: CATARACT EXTRACTION W/PHACO: SHX586

## 2015-03-18 LAB — GLUCOSE, CAPILLARY: GLUCOSE-CAPILLARY: 136 mg/dL — AB (ref 65–99)

## 2015-03-18 SURGERY — PHACOEMULSIFICATION, CATARACT, WITH IOL INSERTION
Anesthesia: Monitor Anesthesia Care | Site: Eye | Laterality: Right | Wound class: Clean

## 2015-03-18 MED ORDER — EPINEPHRINE HCL 1 MG/ML IJ SOLN
INTRAMUSCULAR | Status: DC | PRN
  Administered 2015-03-18: 1 mL via OPHTHALMIC

## 2015-03-18 MED ORDER — TETRACAINE HCL 0.5 % OP SOLN
OPHTHALMIC | Status: DC | PRN
  Administered 2015-03-18: 1 [drp] via OPHTHALMIC

## 2015-03-18 MED ORDER — ALFENTANIL 500 MCG/ML IJ INJ
INJECTION | INTRAMUSCULAR | Status: DC | PRN
  Administered 2015-03-18: 500 ug via INTRAVENOUS

## 2015-03-18 MED ORDER — SODIUM CHLORIDE 0.9 % IV SOLN
INTRAVENOUS | Status: DC
  Administered 2015-03-18: 08:00:00 via INTRAVENOUS

## 2015-03-18 MED ORDER — PHENYLEPHRINE HCL 10 % OP SOLN
1.0000 [drp] | OPHTHALMIC | Status: DC | PRN
  Administered 2015-03-18 (×4): 1 [drp] via OPHTHALMIC

## 2015-03-18 MED ORDER — CARBACHOL 0.01 % IO SOLN
INTRAOCULAR | Status: DC | PRN
  Administered 2015-03-18: 0.5 mL via INTRAOCULAR

## 2015-03-18 MED ORDER — CYCLOPENTOLATE HCL 2 % OP SOLN
OPHTHALMIC | Status: AC
  Administered 2015-03-18: 1 [drp] via OPHTHALMIC
  Filled 2015-03-18: qty 2

## 2015-03-18 MED ORDER — CYCLOPENTOLATE HCL 2 % OP SOLN
1.0000 [drp] | OPHTHALMIC | Status: DC | PRN
  Administered 2015-03-18 (×4): 1 [drp] via OPHTHALMIC

## 2015-03-18 MED ORDER — BUPIVACAINE HCL (PF) 0.75 % IJ SOLN
INTRAMUSCULAR | Status: AC
  Filled 2015-03-18: qty 10

## 2015-03-18 MED ORDER — EPINEPHRINE HCL 1 MG/ML IJ SOLN
INTRAMUSCULAR | Status: AC
  Filled 2015-03-18: qty 2

## 2015-03-18 MED ORDER — FENTANYL CITRATE (PF) 100 MCG/2ML IJ SOLN
INTRAMUSCULAR | Status: DC | PRN
  Administered 2015-03-18 (×2): 25 ug via INTRAVENOUS

## 2015-03-18 MED ORDER — TRYPAN BLUE 0.06 % OP SOLN
OPHTHALMIC | Status: AC
  Filled 2015-03-18: qty 0.5

## 2015-03-18 MED ORDER — CEFUROXIME OPHTHALMIC INJECTION 1 MG/0.1 ML
INJECTION | OPHTHALMIC | Status: DC | PRN
  Administered 2015-03-18: .1 mL via INTRACAMERAL

## 2015-03-18 MED ORDER — HYALURONIDASE HUMAN 150 UNIT/ML IJ SOLN
INTRAMUSCULAR | Status: AC
  Filled 2015-03-18: qty 1

## 2015-03-18 MED ORDER — LIDOCAINE HCL (PF) 4 % IJ SOLN
INTRAMUSCULAR | Status: AC
  Filled 2015-03-18: qty 10

## 2015-03-18 MED ORDER — TETRACAINE HCL 0.5 % OP SOLN
OPHTHALMIC | Status: AC
  Filled 2015-03-18: qty 2

## 2015-03-18 MED ORDER — MOXIFLOXACIN HCL 0.5 % OP SOLN
OPHTHALMIC | Status: AC
  Administered 2015-03-18: 1 [drp] via OPHTHALMIC
  Filled 2015-03-18: qty 3

## 2015-03-18 MED ORDER — LIDOCAINE HCL (PF) 4 % IJ SOLN
INTRAOCULAR | Status: DC | PRN
  Administered 2015-03-18: .5 mL via OPHTHALMIC

## 2015-03-18 MED ORDER — MIDAZOLAM HCL 2 MG/2ML IJ SOLN
INTRAMUSCULAR | Status: DC | PRN
  Administered 2015-03-18: 1 mg via INTRAVENOUS

## 2015-03-18 MED ORDER — NA CHONDROIT SULF-NA HYALURON 40-17 MG/ML IO SOLN
INTRAOCULAR | Status: DC | PRN
  Administered 2015-03-18: 1 mL via INTRAOCULAR

## 2015-03-18 MED ORDER — TRYPAN BLUE 0.06 % OP SOLN
OPHTHALMIC | Status: DC | PRN
  Administered 2015-03-18: .5 mL via INTRAOCULAR

## 2015-03-18 MED ORDER — LIDOCAINE HCL (PF) 4 % IJ SOLN
INTRAMUSCULAR | Status: DC | PRN
  Administered 2015-03-18: 4 mL via OPHTHALMIC

## 2015-03-18 MED ORDER — CEFUROXIME OPHTHALMIC INJECTION 1 MG/0.1 ML
INJECTION | OPHTHALMIC | Status: AC
  Filled 2015-03-18: qty 0.1

## 2015-03-18 MED ORDER — MOXIFLOXACIN HCL 0.5 % OP SOLN
1.0000 [drp] | OPHTHALMIC | Status: DC | PRN
  Administered 2015-03-18 (×3): 1 [drp] via OPHTHALMIC

## 2015-03-18 MED ORDER — PHENYLEPHRINE HCL 10 % OP SOLN
OPHTHALMIC | Status: AC
  Administered 2015-03-18: 1 [drp] via OPHTHALMIC
  Filled 2015-03-18: qty 5

## 2015-03-18 MED ORDER — NA CHONDROIT SULF-NA HYALURON 40-17 MG/ML IO SOLN
INTRAOCULAR | Status: AC
  Filled 2015-03-18: qty 1

## 2015-03-18 SURGICAL SUPPLY — 33 items
CANNULA ANT/CHMB 27G (MISCELLANEOUS) ×1 IMPLANT
CANNULA ANT/CHMB 27GA (MISCELLANEOUS) ×6 IMPLANT
CORD BIP STRL DISP 12FT (MISCELLANEOUS) ×3 IMPLANT
CUP MEDICINE 2OZ PLAST GRAD ST (MISCELLANEOUS) ×3 IMPLANT
DRAPE XRAY CASSETTE 23X24 (DRAPES) ×3 IMPLANT
ERASER HMR WETFIELD 18G (MISCELLANEOUS) ×3 IMPLANT
FILTER MILLEX .045 (MISCELLANEOUS) IMPLANT
FLTR MILLEX .045 (MISCELLANEOUS) ×3
GLOVE BIO SURGEON STRL SZ8 (GLOVE) ×3 IMPLANT
GLOVE SURG LX 6.5 MICRO (GLOVE) ×2
GLOVE SURG LX 8.0 MICRO (GLOVE) ×2
GLOVE SURG LX STRL 6.5 MICRO (GLOVE) ×1 IMPLANT
GLOVE SURG LX STRL 8.0 MICRO (GLOVE) ×1 IMPLANT
GOWN STRL REUS W/ TWL LRG LVL3 (GOWN DISPOSABLE) ×1 IMPLANT
GOWN STRL REUS W/ TWL XL LVL3 (GOWN DISPOSABLE) ×1 IMPLANT
GOWN STRL REUS W/TWL LRG LVL3 (GOWN DISPOSABLE) ×3
GOWN STRL REUS W/TWL XL LVL3 (GOWN DISPOSABLE) ×3
LENS IOL ACRSF IQ ULTRA 24.0 (Intraocular Lens) IMPLANT
LENS IOL ACRYSOF IQ 24.0 (Intraocular Lens) ×3 IMPLANT
PACK CATARACT (MISCELLANEOUS) ×3 IMPLANT
PACK CATARACT DINGLEDEIN LX (MISCELLANEOUS) ×3 IMPLANT
PACK EYE AFTER SURG (MISCELLANEOUS) ×3 IMPLANT
SHLD EYE VISITEC  UNIV (MISCELLANEOUS) ×3 IMPLANT
SOL BSS BAG (MISCELLANEOUS) ×3
SOL PREP PVP 2OZ (MISCELLANEOUS) ×3
SOLUTION BSS BAG (MISCELLANEOUS) ×1 IMPLANT
SOLUTION PREP PVP 2OZ (MISCELLANEOUS) ×1 IMPLANT
SUT SILK 5-0 (SUTURE) ×3 IMPLANT
SYR 3ML LL SCALE MARK (SYRINGE) ×3 IMPLANT
SYR 5ML LL (SYRINGE) ×3 IMPLANT
SYR TB 1ML 27GX1/2 LL (SYRINGE) ×3 IMPLANT
WATER STERILE IRR 1000ML POUR (IV SOLUTION) ×3 IMPLANT
WIPE NON LINTING 3.25X3.25 (MISCELLANEOUS) ×3 IMPLANT

## 2015-03-18 NOTE — Anesthesia Preprocedure Evaluation (Signed)
Anesthesia Evaluation  Patient identified by MRN, date of birth, ID band Patient awake    Reviewed: Allergy & Precautions, NPO status , Patient's Chart, lab work & pertinent test results  Airway Mallampati: III  TM Distance: >3 FB Neck ROM: Limited    Dental  (+) Upper Dentures, Lower Dentures   Pulmonary    Pulmonary exam normal        Cardiovascular Exercise Tolerance: Poor hypertension, Pt. on medications and Pt. on home beta blockers + angina with exertion + CAD and + Cardiac Stents  Normal cardiovascular exam  Occasional palpitations--not in a-fib.   Neuro/Psych    GI/Hepatic GERD  Medicated and Controlled,  Endo/Other  diabetes, Type 2BG 136.  Renal/GU Renal InsufficiencyRenal disease     Musculoskeletal   Abdominal (+) + obese,   Peds  Hematology   Anesthesia Other Findings   Reproductive/Obstetrics                             Anesthesia Physical Anesthesia Plan  ASA: III  Anesthesia Plan: MAC   Post-op Pain Management:    Induction: Intravenous  Airway Management Planned: Nasal Cannula  Additional Equipment:   Intra-op Plan:   Post-operative Plan:   Informed Consent: I have reviewed the patients History and Physical, chart, labs and discussed the procedure including the risks, benefits and alternatives for the proposed anesthesia with the patient or authorized representative who has indicated his/her understanding and acceptance.     Plan Discussed with: CRNA  Anesthesia Plan Comments:         Anesthesia Quick Evaluation

## 2015-03-18 NOTE — Transfer of Care (Signed)
Immediate Anesthesia Transfer of Care Note  Patient: Valerie Foster  Procedure(s) Performed: Procedure(s) with comments: CATARACT EXTRACTION PHACO AND INTRAOCULAR LENS PLACEMENT (IOC) (Right) - [place]    1:08.3 AP%   24 CDE   30.33 fluid pack lot # [id number]  Patient Location: Short Stay  Anesthesia Type:MAC  Level of Consciousness: awake  Airway & Oxygen Therapy: Patient Spontanous Breathing  Post-op Assessment: Report given to RN  Post vital signs: Reviewed  Last Vitals:  Filed Vitals:   03/18/15 0752 03/18/15 0930  BP: 137/80 148/70  Pulse: 62 48  Temp: 35.9 C 36.5 C  Resp: 16 16    Complications: No apparent anesthesia complications

## 2015-03-18 NOTE — Discharge Instructions (Signed)
See scanned note.

## 2015-03-18 NOTE — Anesthesia Postprocedure Evaluation (Signed)
Anesthesia Post Note  Patient: Valerie Foster  Procedure(s) Performed: Procedure(s) (LRB): CATARACT EXTRACTION PHACO AND INTRAOCULAR LENS PLACEMENT (IOC) (Right)  Patient location during evaluation: Short Stay Anesthesia Type: MAC Level of consciousness: awake and alert Pain management: pain level controlled Vital Signs Assessment: post-procedure vital signs reviewed and stable Respiratory status: spontaneous breathing Postop Assessment: no headache Anesthetic complications: no    Last Vitals:  Filed Vitals:   03/18/15 0752 03/18/15 0930  BP: 137/80 148/70  Pulse: 62 48  Temp: 35.9 C 36.5 C  Resp: 16 16    Last Pain: There were no vitals filed for this visit.               Rolla Plate P

## 2015-03-18 NOTE — Interval H&P Note (Signed)
History and Physical Interval Note:  03/18/2015 8:43 AM  Valerie Foster  has presented today for surgery, with the diagnosis of CATARACT  The various methods of treatment have been discussed with the patient and family. After consideration of risks, benefits and other options for treatment, the patient has consented to  Procedure(s): CATARACT EXTRACTION PHACO AND INTRAOCULAR LENS PLACEMENT ([place]) (Right) as a surgical intervention .  The patient's history has been reviewed, patient examined, no change in status, stable for surgery.  I have reviewed the patient's chart and labs.  Questions were answered to the patient's satisfaction.     Arrow Tomko

## 2015-03-18 NOTE — Op Note (Signed)
Date of Surgery: 03/18/2015 Date of Dictation: 03/18/2015 9:28 AM Pre-operative Diagnosis:Nuclear Sclerotic Cataract and Cortical Cataract right Eye Post-operative Diagnosis: same Procedure performed: Extra-capsular Cataract Extraction (ECCE) with placement of a posterior chamber intraocular lens (IOL) right Eye IOL:  Implant Name Type Inv. Item Serial No. Manufacturer Lot No. LRB No. Used  LENS IOL ACRYSOF IQ 24.0 - [id number] Intraocular Lens LENS IOL ACRYSOF IQ 24.0 [id number] ALCON   Right 1   Anesthesia: 2% Lidocaine and 4% Marcaine in a 50/50 mixture with 10 unites/ml of Hylenex given as a peribulbar Anesthesiologist: Anesthesiologist: Elyse Hsu, MD CRNA: Rolla Plate, CRNA Complications: none Estimated Blood Loss: less than 1 ml  Description of procedure:  The patient was given anesthesia and sedation via intravenous access. The patient was then prepped and draped in the usual fashion. A 25-gauge needle was bent for initiating the capsulorhexis. A 5-0 silk suture was placed through the conjunctiva superior and inferiorly to serve as bridle sutures. Hemostasis was obtained at the superior limbus using an eraser cautery. A partial thickness groove was made at the anterior surgical limbus with a 64 Beaver blade and this was dissected anteriorly with an [employer]. The anterior chamber was entered at 10 o'clock and 2 o'clock with a 1.0 mm paracentesis knife. Epi-Shugarcaine 0.5 CC [9 cc BSS Plus (Alcon), 3 cc 4% preservative-free lidocaine (Hospira) and 4 cc 1:1000 preservative-free, bisulfite-free epinephrine] was injected into the anterior chamber via the paracentesis tract. Air was introduced via the paracentesis to replace the aqueous and Vision Blue was used to paint the surface of the anterior capsule. The air was then replaced with DiscoVisc.   The anterior chamber was entered through the lamellar dissection with a 2.6 mm Alcon keratome.A continuous tear curvilinear  capsulorhexis was performed using a bent 25-gauge needle.  Balance salt on a syringe was used to perform hydro-dissection and phacoemulsification was carried out using a divide and conquer technique. Procedure(s) with comments: CATARACT EXTRACTION PHACO AND INTRAOCULAR LENS PLACEMENT (IOC) (Right) - [place]    1:08.3 AP%   24 CDE   30.33 fluid pack lot # [id number]. Irrigation/aspiration was used to remove the residual cortex and the capsular bag was inflated with DiscoVisc. Irrigation/aspiration was used to remove the residual cortex and the capsular bag was inflated with DiscoVisc. The intraocular lens was inserted into the capsular bag using a pre-loaded UltraSert Delivery System. Irrigation/aspiration was used to remove the residual DiscoVisc. The wound was inflated with balanced salt and checked for leaks. None were found. Miostat was injected via the paracentesis track and 0.1 ml of cefuroxime containing 1 mg of drug  was injected via the paracentesis track. The wound was checked for leaks again and none were found.   The bridal sutures were removed and two drops of Vigamox were placed on the eye. An eye shield was placed to protect the eye and the patient was discharged to the recovery area in good condition.   Harlin Mazzoni MD

## 2015-03-18 NOTE — H&P (Signed)
See scanned note.

## 2015-03-19 ENCOUNTER — Emergency Department
Admission: EM | Admit: 2015-03-19 | Discharge: 2015-03-19 | Disposition: A | Discharge status: Home / Self Care | Payer: Medicare Other | Attending: Emergency Medicine | Admitting: Emergency Medicine

## 2015-03-19 ENCOUNTER — Encounter: Payer: Self-pay | Admitting: Emergency Medicine

## 2015-03-19 DIAGNOSIS — E119 Type 2 diabetes mellitus without complications: Secondary | ICD-10-CM | POA: Diagnosis not present

## 2015-03-19 DIAGNOSIS — I130 Hypertensive heart and chronic kidney disease with heart failure and stage 1 through stage 4 chronic kidney disease, or unspecified chronic kidney disease: Secondary | ICD-10-CM | POA: Insufficient documentation

## 2015-03-19 DIAGNOSIS — Z791 Long term (current) use of non-steroidal anti-inflammatories (NSAID): Secondary | ICD-10-CM | POA: Diagnosis not present

## 2015-03-19 DIAGNOSIS — H59311 Postprocedural hemorrhage and hematoma of right eye and adnexa following an ophthalmic procedure: Secondary | ICD-10-CM | POA: Insufficient documentation

## 2015-03-19 DIAGNOSIS — H538 Other visual disturbances: Secondary | ICD-10-CM

## 2015-03-19 DIAGNOSIS — H578 Other specified disorders of eye and adnexa: Secondary | ICD-10-CM | POA: Diagnosis present

## 2015-03-19 DIAGNOSIS — Z79899 Other long term (current) drug therapy: Secondary | ICD-10-CM | POA: Insufficient documentation

## 2015-03-19 DIAGNOSIS — Y658 Other specified misadventures during surgical and medical care: Secondary | ICD-10-CM | POA: Diagnosis not present

## 2015-03-19 DIAGNOSIS — N189 Chronic kidney disease, unspecified: Secondary | ICD-10-CM | POA: Insufficient documentation

## 2015-03-19 DIAGNOSIS — I509 Heart failure, unspecified: Secondary | ICD-10-CM | POA: Diagnosis not present

## 2015-03-19 DIAGNOSIS — Z7982 Long term (current) use of aspirin: Secondary | ICD-10-CM | POA: Insufficient documentation

## 2015-03-19 DIAGNOSIS — H1131 Conjunctival hemorrhage, right eye: Secondary | ICD-10-CM | POA: Diagnosis not present

## 2015-03-19 MED ORDER — TETRACAINE HCL 0.5 % OP SOLN
1.0000 [drp] | Freq: Once | OPHTHALMIC | Status: DC
  Filled 2015-03-19: qty 2

## 2015-03-19 MED ORDER — FLUORESCEIN SODIUM 1 MG OP STRP
1.0000 | ORAL_STRIP | Freq: Once | OPHTHALMIC | Status: DC
  Filled 2015-03-19: qty 1

## 2015-03-19 NOTE — ED Notes (Signed)
Upon visual acuity check, left eye 20/50 and pt reports unable to see chart with right eye, states "I only see the outline of you"

## 2015-03-19 NOTE — ED Provider Notes (Signed)
[facility] Emergency Department Provider Note  ____________________________________________  Time seen: Approximately [id number] AM  I have reviewed the triage vital signs and the nursing notes.   HISTORY  Chief Complaint Eye Problem    HPI Valerie Foster is a 82 y.o. female who comes into the hospital today with right blurred vision. The patient reports that she had cataract surgery done yesterday and woke up this morning with blurred vision. She reports that she had her eye patch on all night and when she took it off this morning her vision was very blurry. The patient reports that she had cataract surgery done on the left eye a month ago and she did not have the similar symptoms. The patient reports that she spoke to her eye doctor last night and was not having any pain but did not know if she had blurred vision due to having the patch on. The patient reports that she continues to not have any pain but her blood pressure is a little bit elevated. The patient had some headache when she woke up but it is gone and she continues to have right-sided blurred vision. She reports that she is unable to read anything with this right eye and can just see shapes. The patient contacted her doctor's office but had a voicemail so she decided to come in and get checked out.   Past Medical History  Diagnosis Date  . Hypertension   . Diabetes mellitus without complication ([place])   . Heart disease   . Arthritis   . Dysrhythmia   . Diverticulitis   . Hypercholesteremia   . Anginal pain ([place])   . Coronary artery disease   . Palpitations     Patient Active Problem List   Diagnosis Date Noted  . Angina pectoris ([place]) 07/18/2014  . Arthritis 07/18/2014  . CAD in native artery 07/18/2014  . Chronic anxiety 07/18/2014  . Diabetes ([place]) 07/18/2014  . BP (high blood pressure) 07/18/2014  . Heart & renal disease, hypertensive, with heart failure ([place]) 07/18/2014  . Insomnia, matutinal  07/18/2014  . Benign neoplasm of colon 05/19/2013  . Acid reflux 05/19/2013  . Hypercholesteremia 05/19/2013  . Neuropathy ([place]) 05/19/2013    Past Surgical History  Procedure Laterality Date  . Coronary stent placement  06/17/2011  . Finger surgery      for trigger finger  . Tonsillectomy    . Cardiac surgery    . Cataract extraction w/phaco Left 01/28/2015    Procedure: CATARACT EXTRACTION PHACO AND INTRAOCULAR LENS PLACEMENT (IOC);  Surgeon: Estill Cotta, MD;  Location: [facility] ORS;  Service: Ophthalmology;  Laterality: Left;  [place] 01:13 AP% 58.5 CDE 30.55 fluid pack lot # [id number]  . Coronary angioplasty      stent  . Cataract extraction w/phaco Right 03/18/2015    Procedure: CATARACT EXTRACTION PHACO AND INTRAOCULAR LENS PLACEMENT (IOC);  Surgeon: Estill Cotta, MD;  Location: [facility] ORS;  Service: Ophthalmology;  Laterality: Right;  [place]    1:08.3 AP%   24 CDE   30.33 fluid pack lot # [id number]    Current Outpatient Rx  Name  Route  Sig  Dispense  Refill  . aspirin 81 MG tablet   Oral   Take by mouth.         . BENICAR 20 MG tablet      TAKE 1 TABLET BY MOUTH AT BEDTIME   90 tablet   3   . cetirizine (ZYRTEC) 10 MG tablet   Oral  Take 1 tablet (10 mg total) by mouth daily.         . DUREZOL 0.05 % EMUL      INSTILL 1 DROP INTO LEFT EYE TWICE DAILY AFTER PATCH IS REMOVED      0     Dispense as written.   . fluticasone (FLONASE) 50 MCG/ACT nasal spray   Nasal   Place into the nose.         . gabapentin (NEURONTIN) 100 MG capsule   Oral   Take 200 mg by mouth at bedtime.          . hydrochlorothiazide (HYDRODIURIL) 25 MG tablet      TAKE 1 TABLET EVERY DAY   90 tablet   3   . ILEVRO 0.3 % ophthalmic suspension      START 2 DAYS BEFORE SURGERY AND CON'T AFTER SURGERY UNTIL BOTTLES FINISHED- INSTILL 1 DROP DAILY      0     Dispense as written.   . meloxicam (MOBIC) 7.5 MG tablet   Oral   Take by mouth.         . metFORMIN  (GLUCOPHAGE) 500 MG tablet   Oral   Take by mouth 2 (two) times daily with a meal.         . metoprolol succinate (TOPROL-XL) 50 MG 24 hr tablet      TAKE 1 TABLET EVERY DAY   90 tablet   3   . sodium chloride (OCEAN) 0.65 % SOLN nasal spray   Each Nare   Place 2 sprays into both nostrils every 2 (two) hours while awake.      0     Allergies Sulfa antibiotics  Family History  Problem Relation Age of Onset  . Hypertension Mother   . Diabetes Mother   . Heart attack Father   . Colon cancer Sister     Social History Social History  Substance Use Topics  . Smoking status: Never Smoker   . Smokeless tobacco: Never Used  . Alcohol Use: No    Review of Systems Constitutional: No fever/chills Eyes: Right blurred vision ENT: No sore throat. Cardiovascular: Denies chest pain. Respiratory: Denies shortness of breath. Gastrointestinal: No abdominal pain.  No nausea, no vomiting.  No diarrhea.  No constipation. Genitourinary: Negative for dysuria. Musculoskeletal: Negative for back pain. Skin: Negative for rash. Neurological: Negative for headaches, focal weakness or numbness.  10-point ROS otherwise negative.  ____________________________________________   PHYSICAL EXAM:  VITAL SIGNS: ED Triage Vitals  Enc Vitals Group     BP 03/19/15 0626 192/77 mmHg     Pulse Rate 03/19/15 0626 60     Resp 03/19/15 0626 18     Temp 03/19/15 0626 97.9 F (36.6 C)     Temp Source 03/19/15 0626 Oral     SpO2 03/19/15 0626 99 %     Weight 03/19/15 0626 180 lb (81.647 kg)     Height 03/19/15 0626 5\' 7"  (1.702 m)     Head Cir --      Peak Flow --      Pain Score --      Pain Loc --      Pain Edu? --      Excl. in [place]? --     Constitutional: Alert and oriented. Well appearing and in mild distress. Eyes: Conjunctivae are normal. Sclera with some hemorrhage, cornea cloudy, extraocular muscles intact Head: Atraumatic. Nose: No congestion/rhinnorhea. Mouth/Throat: Mucous  membranes are moist.  Oropharynx  non-erythematous. Cardiovascular: Normal rate, regular rhythm. Grossly normal heart sounds.  Good peripheral circulation. Respiratory: Normal respiratory effort.  No retractions. Lungs CTAB. Gastrointestinal: Soft and nontender. No distention. Active bowel sounds Musculoskeletal: No lower extremity tenderness nor edema.   Neurologic:  Normal speech and language.  Skin:  Skin is warm, dry and intact.  Psychiatric: Mood and affect are normal.   ____________________________________________   LABS (all labs ordered are listed, but only abnormal results are displayed)  Labs Reviewed - No data to display ____________________________________________  EKG  None ____________________________________________  RADIOLOGY  None ____________________________________________   PROCEDURES  Procedure(s) performed: None  Critical Care performed: No  ____________________________________________   INITIAL IMPRESSION / ASSESSMENT AND PLAN / ED COURSE  Pertinent labs & imaging results that were available during my care of the patient were reviewed by me and considered in my medical decision making (see chart for details).  This is a 82 year old female who comes into the hospital today after having a cataract surgery yesterday. I contacted the ophthalmologist on call Dr. Charlann Boxer who felt that the patient did not need to have eye pressures done. She reports that the scleral hemorrhage is not uncommon for the procedure that the patient had as well as the steamy and cloudy cornea. She reports that typically the blurred vision clears up after 24-48 hours. She reports though that if the patient would like to come in earlier than her scheduled appointment today she can come to the office at 8:30. She reports she can also keep her appointment at 10 AM. The patient is not having any pain at this time and she'll be discharged to follow-up in the ophthalmology  office. ____________________________________________   FINAL CLINICAL IMPRESSION(S) / ED DIAGNOSES  Final diagnoses:  Blurred vision  Scleral hemorrhage of right eye      Loney Hering, MD 03/19/15 [phone]

## 2015-03-19 NOTE — Discharge Instructions (Signed)
Blurred Vision Having blurred vision means that you cannot see things clearly. Your vision may seem fuzzy or out of focus. Blurred vision is a very common symptom of an eye or vision problem. Blurred vision is often a gradual blur that occurs in one eye or both eyes. There are many causes of blurred vision, including cataracts, macular degeneration, and diabetic retinopathy. Blurred vision can be diagnosed based on your symptoms and a physical exam. Tell your health care provider about any other health problems you have, any recent eye injury, and any prior surgeries. You may need to see a health care provider who specializes in eye problems (ophthalmologist). Your treatment depends on what is causing your blurred vision.  HOME CARE INSTRUCTIONS  Tell your health care provider about any changes in your blurred vision.  Do not drive or operate heavy machinery if your vision is blurry.  Keep all follow-up visits as directed by your health care provider. This is important. SEEK MEDICAL CARE IF:  Your symptoms get worse.  You have new symptoms.  You have trouble seeing at night.  You have trouble seeing up close or far away.  You have trouble noticing the difference between colors. SEEK IMMEDIATE MEDICAL CARE IF:  You have severe eye pain.  You have a severe headache.  You have flashing lights in your field of vision.  You have a sudden change in vision.  You have a sudden loss of vision.  You have vision change after an injury.  You notice drainage coming from your eyes.  You notice a rash around your eyes.   This information is not intended to replace advice given to you by your health care provider. Make sure you discuss any questions you have with your health care provider.   Document Released: 02/12/2003 Document Revised: 06/26/2014 Document Reviewed: 01/03/2014 Elsevier Interactive Patient Education 2016 [employer]  Subconjunctival Hemorrhage Subconjunctival  hemorrhage is bleeding that happens between the white part of your eye (sclera) and the clear membrane that covers the outside of your eye (conjunctiva). There are many tiny blood vessels near the surface of your eye. A subconjunctival hemorrhage happens when one or more of these vessels breaks and bleeds, causing a red patch to appear on your eye. This is similar to a bruise. Depending on the amount of bleeding, the red patch may only cover a small area of your eye or it may cover the entire visible part of the sclera. If a lot of blood collects under the conjunctiva, there may also be swelling. Subconjunctival hemorrhages do not affect your vision or cause pain, but your eye may feel irritated if there is swelling. Subconjunctival hemorrhages usually do not require treatment, and they disappear on their own within two weeks. CAUSES This condition may be caused by:  Mild trauma, such as rubbing your eye too hard.  Severe trauma or blunt injuries.  Coughing, sneezing, or vomiting.  Straining, such as when lifting a heavy object.  High blood pressure.  Recent eye surgery.  A history of diabetes.  Certain medicines, especially blood thinners (anticoagulants).  Other conditions, such as eye tumors, bleeding disorders, or blood vessel abnormalities. Subconjunctival hemorrhages can happen without an obvious cause.  SYMPTOMS  Symptoms of this condition include:  A bright red or dark red patch on the white part of the eye.  The red area may spread out to cover a larger area of the eye before it goes away.  The red area may turn brownish-yellow before  it goes away.  Swelling.  Mild eye irritation. DIAGNOSIS This condition is diagnosed with a physical exam. If your subconjunctival hemorrhage was caused by trauma, your health care provider may refer you to an eye specialist (ophthalmologist) or another specialist to check for other injuries. You may have other tests, including:  An eye  exam.  A blood pressure check.  Blood tests to check for bleeding disorders. If your subconjunctival hemorrhage was caused by trauma, X-rays or a CT scan may be done to check for other injuries. TREATMENT Usually, no treatment is needed. Your health care provider may recommend eye drops or cold compresses to help with discomfort. HOME CARE INSTRUCTIONS  Take over-the-counter and prescription medicines only as directed by your health care provider.  Use eye drops or cold compresses to help with discomfort as directed by your health care provider.  Avoid activities, things, and environments that may irritate or injure your eye.  Keep all follow-up visits as told by your health care provider. This is important. SEEK MEDICAL CARE IF:  You have pain in your eye.  The bleeding does not go away within 3 weeks.  You keep getting new subconjunctival hemorrhages. SEEK IMMEDIATE MEDICAL CARE IF:  Your vision changes or you have difficulty seeing.  You suddenly develop severe sensitivity to light.  You develop a severe headache, persistent vomiting, confusion, or abnormal tiredness (lethargy).  Your eye seems to bulge or protrude from your eye socket.  You develop unexplained bruises on your body.  You have unexplained bleeding in another area of your body.   This information is not intended to replace advice given to you by your health care provider. Make sure you discuss any questions you have with your health care provider.   Document Released: 02/09/2005 Document Revised: 10/31/2014 Document Reviewed: 04/18/2014 Elsevier Interactive Patient Education [employer].

## 2015-03-19 NOTE — ED Notes (Signed)
Patient ambulatory to triage with steady gait, without difficulty or distress noted; pt reports right cataract extraction yesterday; st awoke this morning with redness to right eye and blurring of vision

## 2015-04-06 ENCOUNTER — Other Ambulatory Visit: Payer: Self-pay | Admitting: Family Medicine

## 2015-05-07 ENCOUNTER — Ambulatory Visit
Admission: RE | Admit: 2015-05-07 | Discharge: 2015-05-07 | Disposition: A | Discharge status: Home / Self Care | Payer: Medicare Other | Source: Ambulatory Visit | Attending: Family Medicine | Admitting: Family Medicine

## 2015-05-07 ENCOUNTER — Encounter: Payer: Self-pay | Admitting: Family Medicine

## 2015-05-07 ENCOUNTER — Ambulatory Visit (INDEPENDENT_AMBULATORY_CARE_PROVIDER_SITE_OTHER): Payer: Medicare Other | Admitting: Family Medicine

## 2015-05-07 VITALS — BP 142/80 | HR 62 | Temp 98.1°F | Resp 18 | Wt 190.0 lb

## 2015-05-07 DIAGNOSIS — M542 Cervicalgia: Secondary | ICD-10-CM | POA: Diagnosis not present

## 2015-05-07 DIAGNOSIS — I10 Essential (primary) hypertension: Secondary | ICD-10-CM

## 2015-05-07 DIAGNOSIS — M546 Pain in thoracic spine: Secondary | ICD-10-CM | POA: Insufficient documentation

## 2015-05-07 DIAGNOSIS — E7800 Pure hypercholesterolemia, unspecified: Secondary | ICD-10-CM

## 2015-05-07 DIAGNOSIS — E119 Type 2 diabetes mellitus without complications: Secondary | ICD-10-CM

## 2015-05-07 MED ORDER — CYCLOBENZAPRINE HCL 10 MG PO TABS
10.0000 mg | ORAL_TABLET | Freq: Every day | ORAL | Status: DC
Start: 2015-05-07 — End: 2018-05-16

## 2015-05-07 NOTE — Progress Notes (Signed)
Patient ID: Valerie Foster, female   DOB: 1933/07/13, 82 y.o.   MRN: [id number]    Subjective:  HPI  Diabetes Mellitus Type II, Follow-up:   Lab Results  Component Value Date   HGBA1C 6.6 01/31/2015   HGBA1C 6.7 10/02/2014   HGBA1C 6.3* 05/15/2014   Last seen for diabetes 3 months ago.  Management since then includes stopped Metformin due to frequent urination.  She reports good compliance with treatment. She is not having side effects.  Current symptoms include none. Home blood sugar records: 169 this morning. But has been running higher.  Episodes of hypoglycemia? no   Current Insulin Regimen: n/a Most Recent Eye Exam: 2 weeks ago. Current exercise: none  ------------------------------------------------------------------------   Hypertension, follow-up:  BP Readings from Last 3 Encounters:  03/19/15 180/87  03/18/15 130/60  01/31/15 124/80    She was last seen for hypertension 3 months ago.  BP at that visit was 180/87. Management since that visit includes none .She reports good compliance with treatment. She is not having side effects.  She is not exercising. She is adherent to low salt diet.   Outside blood pressures are 130's/70's. She is experiencing none.  Patient denies chest pain, chest pressure/discomfort, claudication, dyspnea, exertional chest pressure/discomfort, fatigue, irregular heart beat, lower extremity edema, near-syncope, orthopnea and palpitations.     ------------------------------------------------------------------------ Pt has upper back pain, she has had this before. Sometimes when she moves the right way she feels the pain. She has been taking the Advil PM 2 every night and seems to help with the pain and helps her sleep but she is wondering if this is not good for her.  It is not exertional thoracic back pain. Prior to Admission medications   Medication Sig Start Date End Date Taking? Authorizing Provider  aspirin 81 MG tablet Take by  mouth.   Yes Historical Provider, MD  BENICAR 20 MG tablet TAKE 1 TABLET BY MOUTH AT BEDTIME 10/26/14  Yes Noreen Mackintosh Maceo Pro., MD  cetirizine (ZYRTEC) 10 MG tablet Take 1 tablet (10 mg total) by mouth daily. 08/18/14  Yes Tina A Betancourt, NP  DUREZOL 0.05 % EMUL INSTILL 1 DROP INTO LEFT EYE TWICE DAILY AFTER PATCH IS REMOVED 01/26/15  Yes Historical Provider, MD  fluticasone (FLONASE) 50 MCG/ACT nasal spray Place into the nose. 08/29/13  Yes Historical Provider, MD  gabapentin (NEURONTIN) 100 MG capsule Take 200 mg by mouth at bedtime.  08/29/13  Yes Historical Provider, MD  hydrochlorothiazide (HYDRODIURIL) 25 MG tablet TAKE 1 TABLET EVERY DAY 11/12/14  Yes Adith Tejada Maceo Pro., MD  ILEVRO 0.3 % ophthalmic suspension START 2 DAYS BEFORE SURGERY AND CON'T AFTER SURGERY UNTIL BOTTLES FINISHED- INSTILL 1 DROP DAILY 01/26/15  Yes Historical Provider, MD  meloxicam (MOBIC) 7.5 MG tablet Take by mouth. 08/29/13  Yes Historical Provider, MD  metoprolol succinate (TOPROL-XL) 50 MG 24 hr tablet TAKE 1 TABLET EVERY DAY 09/16/14  Yes Corleen Otwell Maceo Pro., MD  sodium chloride (OCEAN) 0.65 % SOLN nasal spray Place 2 sprays into both nostrils every 2 (two) hours while awake. 08/18/14  Yes Olen Cordial, NP  metFORMIN (GLUCOPHAGE) 500 MG tablet TAKE 1 TABLET DAILY Patient not taking: Reported on 05/07/2015 04/08/15   Jerrol Banana., MD    Patient Active Problem List   Diagnosis Date Noted  . Angina pectoris ([place]) 07/18/2014  . Arthritis 07/18/2014  . CAD in native artery 07/18/2014  . Chronic anxiety 07/18/2014  . Diabetes ([place]) 07/18/2014  .  BP (high blood pressure) 07/18/2014  . Heart & renal disease, hypertensive, with heart failure ([place]) 07/18/2014  . Insomnia, matutinal 07/18/2014  . Benign neoplasm of colon 05/19/2013  . Acid reflux 05/19/2013  . Hypercholesteremia 05/19/2013  . Neuropathy ([place]) 05/19/2013    Past Medical History  Diagnosis Date  . Hypertension   . Diabetes mellitus  without complication ([place])   . Heart disease   . Arthritis   . Dysrhythmia   . Diverticulitis   . Hypercholesteremia   . Anginal pain ([place])   . Coronary artery disease   . Palpitations     Social History   Social History  . Marital Status: Married    Spouse Name: N/A  . Number of Children: N/A  . Years of Education: N/A   Occupational History  . Not on file.   Social History Main Topics  . Smoking status: Never Smoker   . Smokeless tobacco: Never Used  . Alcohol Use: No  . Drug Use: No  . Sexual Activity: Not on file   Other Topics Concern  . Not on file   Social History Narrative    Allergies  Allergen Reactions  . Sulfa Antibiotics     Review of Systems  Constitutional: Negative.   HENT: Negative.   Eyes: Negative.   Respiratory: Negative.   Cardiovascular: Negative.   Gastrointestinal: Negative.   Genitourinary: Negative.   Musculoskeletal: Positive for back pain.  Skin: Negative.   Neurological: Negative.   Endo/Heme/Allergies: Negative.   Psychiatric/Behavioral: Negative.     Immunization History  Administered Date(s) Administered  . Influenza, High Dose Seasonal PF 01/31/2015  . Pneumococcal Conjugate-13 08/29/2013  . Pneumococcal Polysaccharide-23 01/28/2006   Objective:  There were no vitals taken for this visit.  Physical Exam  Constitutional: She is oriented to person, place, and time and well-developed, well-nourished, and in no distress.  HENT:  Head: Normocephalic and atraumatic.  Right Ear: External ear normal.  Left Ear: External ear normal.  Nose: Nose normal.  Eyes: Conjunctivae are normal.  Neck: Neck supple. No thyromegaly present.  Cardiovascular: Normal rate, regular rhythm, normal heart sounds and intact distal pulses.   Pulmonary/Chest: Effort normal and breath sounds normal.  Abdominal: Soft.  Musculoskeletal:  No tenderness or edema. Her discomfort seems to be in the paraspinal muscles of her T-spine.    Neurological: She is alert and oriented to person, place, and time.  Skin: Skin is warm and dry.  Psychiatric: Mood, memory, affect and judgment normal.    Lab Results  Component Value Date   WBC 5.8 10/02/2014   HGB 13.7 05/15/2014   HCT 39.9 10/02/2014   PLT 204 10/02/2014   GLUCOSE 109* 06/18/2012   CHOL 259* 07/12/2014   TRIG 201* 07/12/2014   HDL 53 07/12/2014   LDLCALC 166 07/12/2014   TSH 3.00 06/08/2013   HGBA1C 6.6 01/31/2015    CMP     Component Value Date/Time   NA 142 05/15/2014   NA 141 06/18/2012 0206   K 4.7 05/15/2014   K 3.7 06/18/2012 0206   CL 105 06/18/2012 0206   CO2 31 06/18/2012 0206   GLUCOSE 109* 06/18/2012 0206   BUN 16 05/15/2014   BUN 20* 06/18/2012 0206   CREATININE 1.1 05/15/2014   CREATININE 1.13 06/18/2012 0206   CALCIUM 9.6 06/18/2012 0206   PROT 7.4 06/18/2012 0206   ALBUMIN 4.0 06/18/2012 0206   AST 20 06/18/2012 0206   AST 16 06/14/2007   ALT 19 06/18/2012  0206   ALT 17 06/14/2007   ALKPHOS 74 06/18/2012 0206   ALKPHOS 71 06/14/2007   BILITOT 0.4 06/18/2012 0206   GFRNONAA 47* 06/18/2012 0206   GFRAA 54* 06/18/2012 0206    Assessment and Plan :   1. Essential hypertension  - CBC with Differential/Platelet - TSH - Comprehensive metabolic panel  2. Type 2 diabetes mellitus without complication, without long-term current use of insulin (HCC) Good control for age. - POCT HgB A1C--7.0 today  3. Thoracic back pain, unspecified back pain laterality I think this is simple muscular pain. Try muscle relaxant at bedtime for the time being. - DG Thoracic Spine 2 View; Future - cyclobenzaprine (FLEXERIL) 10 MG tablet; Take 1 tablet (10 mg total) by mouth at bedtime.  Dispense: 30 tablet; Refill: 5  4. Cervical pain (neck) Probably OA/DDD - DG Cervical Spine 2 or 3 views; Future - cyclobenzaprine (FLEXERIL) 10 MG tablet; Take 1 tablet (10 mg total) by mouth at bedtime.  Dispense: 30 tablet; Refill: 5  5.  Hypercholesteremia  - Lipid Panel With LDL/HDL Ratio  6. CAD All risk factors treated as much as possible. I have done the exam and reviewed the above chart and it is accurate to the best of my knowledge.  Miguel Aschoff MD [facility] 05/07/2015 11:05 AM

## 2015-05-08 ENCOUNTER — Telehealth: Payer: Self-pay

## 2015-05-08 NOTE — Telephone Encounter (Signed)
Patient advised as directed below. Patient verbalized understanding.  

## 2015-05-08 NOTE — Telephone Encounter (Signed)
-----   Message from Jerrol Banana., MD sent at 05/07/2015  2:15 PM EDT ----- Mild arthritic changes of spine.

## 2015-05-21 DIAGNOSIS — R42 Dizziness and giddiness: Secondary | ICD-10-CM | POA: Diagnosis not present

## 2015-05-21 DIAGNOSIS — E7800 Pure hypercholesterolemia, unspecified: Secondary | ICD-10-CM | POA: Diagnosis not present

## 2015-05-21 DIAGNOSIS — K219 Gastro-esophageal reflux disease without esophagitis: Secondary | ICD-10-CM | POA: Diagnosis not present

## 2015-05-21 DIAGNOSIS — E119 Type 2 diabetes mellitus without complications: Secondary | ICD-10-CM | POA: Diagnosis not present

## 2015-05-21 DIAGNOSIS — I2510 Atherosclerotic heart disease of native coronary artery without angina pectoris: Secondary | ICD-10-CM | POA: Diagnosis not present

## 2015-05-21 DIAGNOSIS — I209 Angina pectoris, unspecified: Secondary | ICD-10-CM | POA: Diagnosis not present

## 2015-05-21 DIAGNOSIS — R000 Tachycardia, unspecified: Secondary | ICD-10-CM | POA: Diagnosis not present

## 2015-05-21 DIAGNOSIS — I10 Essential (primary) hypertension: Secondary | ICD-10-CM | POA: Diagnosis not present

## 2015-05-21 DIAGNOSIS — E784 Other hyperlipidemia: Secondary | ICD-10-CM | POA: Diagnosis not present

## 2015-05-22 ENCOUNTER — Telehealth: Payer: Self-pay

## 2015-05-22 LAB — COMPREHENSIVE METABOLIC PANEL
A/G RATIO: 2 (ref 1.2–2.2)
ALT: 17 IU/L (ref 0–32)
AST: 16 IU/L (ref 0–40)
Albumin: 4.3 g/dL (ref 3.5–4.7)
Alkaline Phosphatase: 76 IU/L (ref 39–117)
BUN/Creatinine Ratio: 14 (ref 11–26)
BUN: 16 mg/dL (ref 8–27)
Bilirubin Total: 0.4 mg/dL (ref 0.0–1.2)
CALCIUM: 9.7 mg/dL (ref 8.7–10.3)
CO2: 26 mmol/L (ref 18–29)
CREATININE: 1.18 mg/dL — AB (ref 0.57–1.00)
Chloride: 100 mmol/L (ref 96–106)
GFR, EST AFRICAN AMERICAN: 50 mL/min/{1.73_m2} — AB (ref >59)
GFR, EST NON AFRICAN AMERICAN: 44 mL/min/{1.73_m2} — AB (ref >59)
Globulin, Total: 2.1 g/dL (ref 1.5–4.5)
Glucose: 123 mg/dL — ABNORMAL HIGH (ref 65–99)
POTASSIUM: 4.4 mmol/L (ref 3.5–5.2)
Sodium: 144 mmol/L (ref 134–144)
TOTAL PROTEIN: 6.4 g/dL (ref 6.0–8.5)

## 2015-05-22 LAB — LIPID PANEL WITH LDL/HDL RATIO
Cholesterol, Total: 295 mg/dL — ABNORMAL HIGH (ref 100–199)
HDL: 50 mg/dL (ref >39)
LDL Calculated: 179 mg/dL — ABNORMAL HIGH (ref 0–99)
LDl/HDL Ratio: 3.6 ratio units — ABNORMAL HIGH (ref 0.0–3.2)
Triglycerides: 331 mg/dL — ABNORMAL HIGH (ref 0–149)
VLDL Cholesterol Cal: 66 mg/dL — ABNORMAL HIGH (ref 5–40)

## 2015-05-22 LAB — CBC WITH DIFFERENTIAL/PLATELET
BASOS: 1 %
Basophils Absolute: 0 10*3/uL (ref 0.0–0.2)
EOS (ABSOLUTE): 0.1 10*3/uL (ref 0.0–0.4)
EOS: 1 %
Hematocrit: 42.9 % (ref 34.0–46.6)
Hemoglobin: 13.7 g/dL (ref 11.1–15.9)
IMMATURE GRANULOCYTES: 0 %
Immature Grans (Abs): 0 10*3/uL (ref 0.0–0.1)
LYMPHS ABS: 1.1 10*3/uL (ref 0.7–3.1)
Lymphs: 19 %
MCH: 27.1 pg (ref 26.6–33.0)
MCHC: 31.9 g/dL (ref 31.5–35.7)
MCV: 85 fL (ref 79–97)
MONOS ABS: 0.4 10*3/uL (ref 0.1–0.9)
Monocytes: 6 %
Neutrophils Absolute: 4.4 10*3/uL (ref 1.4–7.0)
Neutrophils: 73 %
PLATELETS: 244 10*3/uL (ref 150–379)
RBC: 5.05 x10E6/uL (ref 3.77–5.28)
RDW: 14.8 % (ref 12.3–15.4)
WBC: 6 10*3/uL (ref 3.4–10.8)

## 2015-05-22 LAB — TSH: TSH: 4.55 u[IU]/mL — AB (ref 0.450–4.500)

## 2015-05-22 NOTE — Telephone Encounter (Signed)
-----   Message from Jerrol Banana., MD sent at 05/22/2015 11:39 AM EDT ----- Lipids still mildly elevated.thyroid borderline. Repeat TSH on next visit.

## 2015-05-22 NOTE — Telephone Encounter (Signed)
LMTCB 05/22/2015  Thanks,   -Mickel Baas

## 2015-05-23 NOTE — Telephone Encounter (Signed)
Pt informed and voiced understanding of results. 

## 2015-06-27 ENCOUNTER — Ambulatory Visit (INDEPENDENT_AMBULATORY_CARE_PROVIDER_SITE_OTHER): Payer: Medicare Other | Admitting: Family Medicine

## 2015-06-27 VITALS — BP 124/70 | HR 72 | Temp 98.1°F | Resp 16

## 2015-06-27 DIAGNOSIS — G4489 Other headache syndrome: Secondary | ICD-10-CM

## 2015-06-27 DIAGNOSIS — M542 Cervicalgia: Secondary | ICD-10-CM | POA: Diagnosis not present

## 2015-06-27 MED ORDER — PREDNISONE 10 MG (21) PO TBPK
ORAL_TABLET | ORAL | Status: DC
Start: 2015-06-27 — End: 2015-08-13

## 2015-06-27 NOTE — Progress Notes (Signed)
Patient ID: Valerie Foster, female   DOB: 04/15/1933, 83 y.o.   MRN: [id number]    Subjective:  HPI  Patient states she has had right side neck pain, behind right ear pain and headache all on the right side mainly happens at night and it wakes her up. This has been going for about 2 months but at first she had right upper back pain and behind shoulder area pain and the pain has radiated up to the head in the past 2 weeks. She has taking Meloxicam and Advil at times and this has giving her some relief. No numbness, tingling or weakness in her right arm. No allergy symptoms. She was seen in March for thoracic pain and we did Cervical and Thoracic spine xrays-no acute abnormalities or fractures, chronic spurring was present. At that time she did take Flexeril for short term.  Prior to Admission medications   Medication Sig Start Date End Date Taking? Authorizing Provider  aspirin 81 MG tablet Take by mouth.    Historical Provider, MD  BENICAR 20 MG tablet TAKE 1 TABLET BY MOUTH AT BEDTIME 10/26/14   Jerrol Banana., MD  cetirizine (ZYRTEC) 10 MG tablet Take 1 tablet (10 mg total) by mouth daily. 08/18/14   Olen Cordial, NP  cyclobenzaprine (FLEXERIL) 10 MG tablet Take 1 tablet (10 mg total) by mouth at bedtime. 05/07/15   Susanna Benge Maceo Pro., MD  DUREZOL 0.05 % EMUL INSTILL 1 DROP INTO LEFT EYE TWICE DAILY AFTER PATCH IS REMOVED 01/26/15   Historical Provider, MD  fluticasone (FLONASE) 50 MCG/ACT nasal spray Place into the nose. 08/29/13   Historical Provider, MD  gabapentin (NEURONTIN) 100 MG capsule Take 200 mg by mouth at bedtime.  08/29/13   Historical Provider, MD  hydrochlorothiazide (HYDRODIURIL) 25 MG tablet TAKE 1 TABLET EVERY DAY 11/12/14   Islam Villescas Maceo Pro., MD  ILEVRO 0.3 % ophthalmic suspension START 2 DAYS BEFORE SURGERY AND CON'T AFTER SURGERY UNTIL BOTTLES FINISHED- INSTILL 1 DROP DAILY 01/26/15   Historical Provider, MD  meloxicam (MOBIC) 7.5 MG tablet Take by mouth. 08/29/13    Historical Provider, MD  metFORMIN (GLUCOPHAGE) 500 MG tablet TAKE 1 TABLET DAILY Patient not taking: Reported on 05/07/2015 04/08/15   Jerrol Banana., MD  metoprolol succinate (TOPROL-XL) 50 MG 24 hr tablet TAKE 1 TABLET EVERY DAY 09/16/14   Jerrol Banana., MD  sodium chloride (OCEAN) 0.65 % SOLN nasal spray Place 2 sprays into both nostrils every 2 (two) hours while awake. 08/18/14   Olen Cordial, NP    Patient Active Problem List   Diagnosis Date Noted  . Angina pectoris ([place]) 07/18/2014  . Arthritis 07/18/2014  . CAD in native artery 07/18/2014  . Chronic anxiety 07/18/2014  . Diabetes ([place]) 07/18/2014  . BP (high blood pressure) 07/18/2014  . Heart & renal disease, hypertensive, with heart failure ([place]) 07/18/2014  . Insomnia, matutinal 07/18/2014  . Benign neoplasm of colon 05/19/2013  . Acid reflux 05/19/2013  . Hypercholesteremia 05/19/2013  . Neuropathy ([place]) 05/19/2013    Past Medical History  Diagnosis Date  . Hypertension   . Diabetes mellitus without complication ([place])   . Heart disease   . Arthritis   . Dysrhythmia   . Diverticulitis   . Hypercholesteremia   . Anginal pain ([place])   . Coronary artery disease   . Palpitations     Social History   Social History  . Marital Status: Married  Spouse Name: N/A  . Number of Children: N/A  . Years of Education: N/A   Occupational History  . Not on file.   Social History Main Topics  . Smoking status: Never Smoker   . Smokeless tobacco: Never Used  . Alcohol Use: No  . Drug Use: No  . Sexual Activity: Not on file   Other Topics Concern  . Not on file   Social History Narrative    Allergies  Allergen Reactions  . Sulfa Antibiotics     Review of Systems  Constitutional: Negative.   HENT: Positive for ear pain.   Respiratory: Negative.   Cardiovascular: Negative.   Musculoskeletal: Positive for myalgias, back pain, joint pain and neck pain.  Neurological: Positive for headaches.     Immunization History  Administered Date(s) Administered  . Influenza, High Dose Seasonal PF 01/31/2015  . Pneumococcal Conjugate-13 08/29/2013  . Pneumococcal Polysaccharide-23 01/28/2006   Objective:  BP 124/70 mmHg  Pulse 72  Temp(Src) 98.1 F (36.7 C)  Resp 16  Physical Exam  Constitutional: She is oriented to person, place, and time and well-developed, well-nourished, and in no distress.  HENT:  Head: Normocephalic and atraumatic.  Right Ear: External ear normal.  Left Ear: External ear normal.  Nose: Nose normal.  Mouth/Throat: Oropharynx is clear and moist.  Eyes: Conjunctivae are normal. Pupils are equal, round, and reactive to light.  Neck: Normal range of motion. Neck supple.  Cardiovascular: Normal rate, regular rhythm, normal heart sounds and intact distal pulses.   No murmur heard. Pulmonary/Chest: Effort normal and breath sounds normal. No respiratory distress. She has no wheezes.  Abdominal: Soft.  Neurological: She is alert and oriented to person, place, and time. No cranial nerve deficit. She exhibits normal muscle tone. Gait normal. Coordination normal.  Skin: Skin is warm.  Psychiatric: Mood, memory, affect and judgment normal.    Lab Results  Component Value Date   WBC 6.0 05/21/2015   HGB 13.7 05/15/2014   HCT 42.9 05/21/2015   PLT 244 05/21/2015   GLUCOSE 123* 05/21/2015   CHOL 295* 05/21/2015   TRIG 331* 05/21/2015   HDL 50 05/21/2015   LDLCALC 179* 05/21/2015   TSH 4.550* 05/21/2015   HGBA1C 6.6 01/31/2015    CMP     Component Value Date/Time   NA 144 05/21/2015 0954   NA 141 06/18/2012 0206   K 4.4 05/21/2015 0954   K 3.7 06/18/2012 0206   CL 100 05/21/2015 0954   CL 105 06/18/2012 0206   CO2 26 05/21/2015 0954   CO2 31 06/18/2012 0206   GLUCOSE 123* 05/21/2015 0954   GLUCOSE 109* 06/18/2012 0206   BUN 16 05/21/2015 0954   BUN 20* 06/18/2012 0206   CREATININE 1.18* 05/21/2015 0954   CREATININE 1.1 05/15/2014   CREATININE  1.13 06/18/2012 0206   CALCIUM 9.7 05/21/2015 0954   CALCIUM 9.6 06/18/2012 0206   PROT 6.4 05/21/2015 0954   PROT 7.4 06/18/2012 0206   ALBUMIN 4.3 05/21/2015 0954   ALBUMIN 4.0 06/18/2012 0206   AST 16 05/21/2015 0954   AST 20 06/18/2012 0206   ALT 17 05/21/2015 0954   ALT 19 06/18/2012 0206   ALKPHOS 76 05/21/2015 0954   ALKPHOS 74 06/18/2012 0206   BILITOT 0.4 05/21/2015 0954   BILITOT 0.4 06/18/2012 0206   GFRNONAA 44* 05/21/2015 0954   GFRNONAA 47* 06/18/2012 0206   GFRAA 50* 05/21/2015 0954   GFRAA 54* 06/18/2012 0206    Assessment and Plan :  1. Cervical pain (neck) Not better. I think her symptoms are coming from neck issue. Will get an MRI of the neck, pending results. May need to refer to specialist for further plan of care.  2. Other headache syndrome I think this is due to neck issue, may need to get MRI of the head. Patient was seen and examined by Dr. Eulas Post and note was scribed by Theressa Millard, RMA.    Miguel Aschoff MD [facility] 06/27/2015 8:23 AM

## 2015-07-12 ENCOUNTER — Ambulatory Visit
Admission: RE | Admit: 2015-07-12 | Discharge: 2015-07-12 | Disposition: A | Discharge status: Home / Self Care | Payer: Medicare Other | Source: Ambulatory Visit | Attending: Family Medicine | Admitting: Family Medicine

## 2015-07-12 DIAGNOSIS — M47812 Spondylosis without myelopathy or radiculopathy, cervical region: Secondary | ICD-10-CM | POA: Diagnosis not present

## 2015-07-12 DIAGNOSIS — M5033 Other cervical disc degeneration, cervicothoracic region: Secondary | ICD-10-CM | POA: Diagnosis not present

## 2015-07-12 DIAGNOSIS — M542 Cervicalgia: Secondary | ICD-10-CM | POA: Insufficient documentation

## 2015-07-12 DIAGNOSIS — G4489 Other headache syndrome: Secondary | ICD-10-CM | POA: Insufficient documentation

## 2015-07-12 DIAGNOSIS — M5031 Other cervical disc degeneration,  high cervical region: Secondary | ICD-10-CM | POA: Insufficient documentation

## 2015-07-12 DIAGNOSIS — M50323 Other cervical disc degeneration at C6-C7 level: Secondary | ICD-10-CM | POA: Insufficient documentation

## 2015-07-15 ENCOUNTER — Telehealth: Payer: Self-pay

## 2015-07-15 DIAGNOSIS — M5030 Other cervical disc degeneration, unspecified cervical region: Secondary | ICD-10-CM

## 2015-07-15 DIAGNOSIS — R51 Headache: Secondary | ICD-10-CM

## 2015-07-15 DIAGNOSIS — M542 Cervicalgia: Secondary | ICD-10-CM

## 2015-07-15 DIAGNOSIS — R519 Headache, unspecified: Secondary | ICD-10-CM

## 2015-07-15 DIAGNOSIS — M5020 Other cervical disc displacement, unspecified cervical region: Secondary | ICD-10-CM

## 2015-07-15 NOTE — Telephone Encounter (Signed)
-----   Message from Mar Daring, [place] sent at 07/12/2015  1:18 PM EDT ----- MRI shows DDD throughout the cervical spine with some disc protrusions that go to the left. None that could cause right sided symptoms. May consider neurosurgical referral for DDD and disc bulges if wanted by patient.

## 2015-07-15 NOTE — Telephone Encounter (Signed)
Pt advised and order put in for referral-aa

## 2015-07-16 ENCOUNTER — Emergency Department
Admission: EM | Admit: 2015-07-16 | Discharge: 2015-07-16 | Disposition: A | Discharge status: Home / Self Care | Payer: Medicare Other | Attending: Emergency Medicine | Admitting: Emergency Medicine

## 2015-07-16 ENCOUNTER — Encounter: Payer: Self-pay | Admitting: Emergency Medicine

## 2015-07-16 ENCOUNTER — Emergency Department: Payer: Medicare Other

## 2015-07-16 DIAGNOSIS — I2510 Atherosclerotic heart disease of native coronary artery without angina pectoris: Secondary | ICD-10-CM | POA: Diagnosis not present

## 2015-07-16 DIAGNOSIS — E1140 Type 2 diabetes mellitus with diabetic neuropathy, unspecified: Secondary | ICD-10-CM | POA: Diagnosis not present

## 2015-07-16 DIAGNOSIS — Z7982 Long term (current) use of aspirin: Secondary | ICD-10-CM | POA: Diagnosis not present

## 2015-07-16 DIAGNOSIS — I110 Hypertensive heart disease with heart failure: Secondary | ICD-10-CM | POA: Diagnosis not present

## 2015-07-16 DIAGNOSIS — Z79899 Other long term (current) drug therapy: Secondary | ICD-10-CM | POA: Insufficient documentation

## 2015-07-16 DIAGNOSIS — M1990 Unspecified osteoarthritis, unspecified site: Secondary | ICD-10-CM | POA: Diagnosis not present

## 2015-07-16 DIAGNOSIS — I10 Essential (primary) hypertension: Secondary | ICD-10-CM | POA: Diagnosis not present

## 2015-07-16 DIAGNOSIS — I509 Heart failure, unspecified: Secondary | ICD-10-CM | POA: Insufficient documentation

## 2015-07-16 DIAGNOSIS — Z85038 Personal history of other malignant neoplasm of large intestine: Secondary | ICD-10-CM | POA: Insufficient documentation

## 2015-07-16 DIAGNOSIS — Z791 Long term (current) use of non-steroidal anti-inflammatories (NSAID): Secondary | ICD-10-CM | POA: Insufficient documentation

## 2015-07-16 DIAGNOSIS — Z7984 Long term (current) use of oral hypoglycemic drugs: Secondary | ICD-10-CM | POA: Insufficient documentation

## 2015-07-16 DIAGNOSIS — R079 Chest pain, unspecified: Secondary | ICD-10-CM | POA: Diagnosis not present

## 2015-07-16 DIAGNOSIS — R42 Dizziness and giddiness: Secondary | ICD-10-CM | POA: Diagnosis present

## 2015-07-16 LAB — TROPONIN I: Troponin I: <0.03 ng/mL (ref <0.031)

## 2015-07-16 LAB — COMPREHENSIVE METABOLIC PANEL
ALBUMIN: 4.4 g/dL (ref 3.5–5.0)
ALK PHOS: 58 U/L (ref 38–126)
ALT: 29 U/L (ref 14–54)
ANION GAP: 5 (ref 5–15)
AST: 25 U/L (ref 15–41)
BUN: 13 mg/dL (ref 6–20)
CALCIUM: 9.7 mg/dL (ref 8.9–10.3)
CHLORIDE: 105 mmol/L (ref 101–111)
CO2: 30 mmol/L (ref 22–32)
Creatinine, Ser: 1.05 mg/dL — ABNORMAL HIGH (ref 0.44–1.00)
GFR calc non Af Amer: 49 mL/min — ABNORMAL LOW (ref >60)
GFR, EST AFRICAN AMERICAN: 57 mL/min — AB (ref >60)
GLUCOSE: 104 mg/dL — AB (ref 65–99)
Potassium: 3.6 mmol/L (ref 3.5–5.1)
SODIUM: 140 mmol/L (ref 135–145)
Total Bilirubin: 0.4 mg/dL (ref 0.3–1.2)
Total Protein: 7.1 g/dL (ref 6.5–8.1)

## 2015-07-16 LAB — CBC
HCT: 42.6 % (ref 35.0–47.0)
HEMOGLOBIN: 13.5 g/dL (ref 12.0–16.0)
MCH: 27.2 pg (ref 26.0–34.0)
MCHC: 31.8 g/dL — AB (ref 32.0–36.0)
MCV: 85.4 fL (ref 80.0–100.0)
PLATELETS: 206 10*3/uL (ref 150–440)
RBC: 4.98 MIL/uL (ref 3.80–5.20)
RDW: 14.6 % — ABNORMAL HIGH (ref 11.5–14.5)
WBC: 7.2 10*3/uL (ref 3.6–11.0)

## 2015-07-16 MED ORDER — OLMESARTAN MEDOXOMIL 40 MG PO TABS: 40.0000 mg | ORAL_TABLET | Freq: Every day | ORAL | Status: DC

## 2015-07-16 NOTE — Discharge Instructions (Signed)
Hypertension Hypertension, commonly called high blood pressure, is when the force of blood pumping through your arteries is too strong. Your arteries are the blood vessels that carry blood from your heart throughout your body. A blood pressure reading consists of a higher number over a lower number, such as 110/72. The higher number (systolic) is the pressure inside your arteries when your heart pumps. The lower number (diastolic) is the pressure inside your arteries when your heart relaxes. Ideally you want your blood pressure below 120/80. Hypertension forces your heart to work harder to pump blood. Your arteries may become narrow or stiff. Having untreated or uncontrolled hypertension can cause heart attack, stroke, kidney disease, and other problems. RISK FACTORS Some risk factors for high blood pressure are controllable. Others are not.  Risk factors you cannot control include:   Race. You may be at higher risk if you are African American.  Age. Risk increases with age.  Gender. Men are at higher risk than women before age 45 years. After age 65, women are at higher risk than men. Risk factors you can control include:  Not getting enough exercise or physical activity.  Being overweight.  Getting too much fat, sugar, calories, or salt in your diet.  Drinking too much alcohol. SIGNS AND SYMPTOMS Hypertension does not usually cause signs or symptoms. Extremely high blood pressure (hypertensive crisis) may cause headache, anxiety, shortness of breath, and nosebleed. DIAGNOSIS To check if you have hypertension, your health care provider will measure your blood pressure while you are seated, with your arm held at the level of your heart. It should be measured at least twice using the same arm. Certain conditions can cause a difference in blood pressure between your right and left arms. A blood pressure reading that is higher than normal on one occasion does not mean that you need treatment. If  it is not clear whether you have high blood pressure, you may be asked to return on a different day to have your blood pressure checked again. Or, you may be asked to monitor your blood pressure at home for 1 or more weeks. TREATMENT Treating high blood pressure includes making lifestyle changes and possibly taking medicine. Living a healthy lifestyle can help lower high blood pressure. You may need to change some of your habits. Lifestyle changes may include:  Following the DASH diet. This diet is high in fruits, vegetables, and whole grains. It is low in salt, red meat, and added sugars.  Keep your sodium intake below 2,300 mg per day.  Getting at least 30-45 minutes of aerobic exercise at least 4 times per week.  Losing weight if necessary.  Not smoking.  Limiting alcoholic beverages.  Learning ways to reduce stress. Your health care provider may prescribe medicine if lifestyle changes are not enough to get your blood pressure under control, and if one of the following is true:  You are 18-59 years of age and your systolic blood pressure is above 140.  You are 60 years of age or older, and your systolic blood pressure is above 150.  Your diastolic blood pressure is above 90.  You have diabetes, and your systolic blood pressure is over 140 or your diastolic blood pressure is over 90.  You have kidney disease and your blood pressure is above 140/90.  You have heart disease and your blood pressure is above 140/90. Your personal target blood pressure may vary depending on your medical conditions, your age, and other factors. HOME CARE INSTRUCTIONS    Have your blood pressure rechecked as directed by your health care provider.   Take medicines only as directed by your health care provider. Follow the directions carefully. Blood pressure medicines must be taken as prescribed. The medicine does not work as well when you skip doses. Skipping doses also puts you at risk for  problems.  Do not smoke.   Monitor your blood pressure at home as directed by your health care provider. SEEK MEDICAL CARE IF:   You think you are having a reaction to medicines taken.  You have recurrent headaches or feel dizzy.  You have swelling in your ankles.  You have trouble with your vision. SEEK IMMEDIATE MEDICAL CARE IF:  You develop a severe headache or confusion.  You have unusual weakness, numbness, or feel faint.  You have severe chest or abdominal pain.  You vomit repeatedly.  You have trouble breathing. MAKE SURE YOU:   Understand these instructions.  Will watch your condition.  Will get help right away if you are not doing well or get worse.   This information is not intended to replace advice given to you by your health care provider. Make sure you discuss any questions you have with your health care provider.   Document Released: 02/09/2005 Document Revised: 06/26/2014 Document Reviewed: 12/02/2012 Elsevier Interactive Patient Education 2016 [employer]  

## 2015-07-16 NOTE — ED Notes (Signed)
Pt arrived to the ED for complaints of HTN. Pt states that she is currently taking HTN medication but her BP has been high for the last 2 days despite the medication. Pt is AOx4 in no apparent distress with no other accompanied symptoms.

## 2015-07-16 NOTE — ED Provider Notes (Signed)
[facility] Emergency Department Provider Note        Time seen: ----------------------------------------- 9:17 PM on 07/16/2015 -----------------------------------------    I have reviewed the triage vital signs and the nursing notes.   HISTORY  Chief Complaint Hypertension    HPI Valerie Foster is a 82 y.o. female who presents ER for high blood pressure. Patient states yesterday evening she began feeling dizzy, checked her blood pressure was elevated. Tonight the same event occurred where she felt dizzy, checked her blood pressure and it was very high. She been taking her medications as prescribed, no recent illness or changes in her medicines.   Past Medical History  Diagnosis Date  . Hypertension   . Diabetes mellitus without complication ([place])   . Heart disease   . Arthritis   . Dysrhythmia   . Diverticulitis   . Hypercholesteremia   . Anginal pain ([place])   . Coronary artery disease   . Palpitations     Patient Active Problem List   Diagnosis Date Noted  . Angina pectoris ([place]) 07/18/2014  . Arthritis 07/18/2014  . CAD in native artery 07/18/2014  . Chronic anxiety 07/18/2014  . Diabetes ([place]) 07/18/2014  . BP (high blood pressure) 07/18/2014  . Heart & renal disease, hypertensive, with heart failure ([place]) 07/18/2014  . Insomnia, matutinal 07/18/2014  . Benign neoplasm of colon 05/19/2013  . Acid reflux 05/19/2013  . Hypercholesteremia 05/19/2013  . Neuropathy ([place]) 05/19/2013    Past Surgical History  Procedure Laterality Date  . Coronary stent placement  06/17/2011  . Finger surgery      for trigger finger  . Tonsillectomy    . Cardiac surgery    . Cataract extraction w/phaco Left 01/28/2015    Procedure: CATARACT EXTRACTION PHACO AND INTRAOCULAR LENS PLACEMENT (IOC);  Surgeon: Estill Cotta, MD;  Location: [facility] ORS;  Service: Ophthalmology;  Laterality: Left;  [place] 01:13 AP% 58.5 CDE 30.55 fluid pack lot # [id number]  .  Coronary angioplasty      stent  . Cataract extraction w/phaco Right 03/18/2015    Procedure: CATARACT EXTRACTION PHACO AND INTRAOCULAR LENS PLACEMENT (IOC);  Surgeon: Estill Cotta, MD;  Location: [facility] ORS;  Service: Ophthalmology;  Laterality: Right;  [place]    1:08.3 AP%   24 CDE   30.33 fluid pack lot # [id number]    Allergies Sulfa antibiotics  Social History Social History  Substance Use Topics  . Smoking status: Never Smoker   . Smokeless tobacco: Never Used  . Alcohol Use: No    Review of Systems Constitutional: Negative for fever. Eyes: Negative for visual changes. ENT: Negative for sore throat. Cardiovascular: Negative for chest pain. Respiratory: Negative for shortness of breath. Gastrointestinal: Negative for abdominal pain, vomiting and diarrhea. Genitourinary: Negative for dysuria. Musculoskeletal: Negative for back pain. Skin: Negative for rash. Neurological: Positive for dizziness  10-point ROS otherwise negative.  ____________________________________________   PHYSICAL EXAM:  VITAL SIGNS: ED Triage Vitals  Enc Vitals Group     BP 07/16/15 2003 201/100 mmHg     Pulse Rate 07/16/15 2003 64     Resp 07/16/15 2003 18     Temp 07/16/15 2003 97.5 F (36.4 C)     Temp Source 07/16/15 2003 Oral     SpO2 07/16/15 2003 99 %     Weight 07/16/15 2003 185 lb (83.915 kg)     Height 07/16/15 2003 5\' 7"  (1.702 m)     Head Cir --      Peak  Flow --      Pain Score 07/16/15 2004 5     Pain Loc --      Pain Edu? --      Excl. in [place]? --     Constitutional: Alert and oriented. Well appearing and in no distress. Eyes: Conjunctivae are normal. PERRL. Normal extraocular movements. ENT   Head: Normocephalic and atraumatic.   Nose: No congestion/rhinnorhea.   Mouth/Throat: Mucous membranes are moist.   Neck: No stridor. Cardiovascular: Normal rate, regular rhythm. No murmurs, rubs, or gallops. Respiratory: Normal respiratory effort without tachypnea  nor retractions. Breath sounds are clear and equal bilaterally. No wheezes/rales/rhonchi. Gastrointestinal: Soft and nontender. Normal bowel sounds Musculoskeletal: Nontender with normal range of motion in all extremities. No lower extremity tenderness nor edema. Neurologic:  Normal speech and language. No gross focal neurologic deficits are appreciated.  Skin:  Skin is warm, dry and intact. No rash noted. Psychiatric: Mood and affect are normal. Speech and behavior are normal.  ____________________________________________  EKG: Interpreted by me.Sinus rhythm with a rate of 60 bpm, normal PR interval, normal QRS, normal QT intervals. Normal axis. Possible septal infarct age indeterminate  ____________________________________________  ED COURSE:  Pertinent labs & imaging results that were available during my care of the patient were reviewed by me and considered in my medical decision making (see chart for details). Patient is in no acute distress, we will check basic labs and reevaluate. I'm hesitant to treat what is essentially symptomatically hypertension at this time. ____________________________________________    LABS (pertinent positives/negatives)  Labs Reviewed  CBC - Abnormal; Notable for the following:    MCHC 31.8 (*)    RDW 14.6 (*)    All other components within normal limits  COMPREHENSIVE METABOLIC PANEL - Abnormal; Notable for the following:    Glucose, Bld 104 (*)    Creatinine, Ser 1.05 (*)    GFR calc non Af Amer 49 (*)    GFR calc Af Amer 57 (*)    All other components within normal limits  TROPONIN I   ____________________________________________  FINAL ASSESSMENT AND PLAN  Hypertension  Plan: Patient with labs as dictated above. Patient's labs are grossly unremarkable for her. I will consider increasing her Benicar. She is stable for outpatient follow-up with her doctor.   Earleen Newport, MD   Note: This dictation was prepared with Dragon  dictation. Any transcriptional errors that result from this process are unintentional   Earleen Newport, MD 07/16/15 2143

## 2015-07-25 DIAGNOSIS — N183 Chronic kidney disease, stage 3 (moderate): Secondary | ICD-10-CM | POA: Diagnosis not present

## 2015-07-25 DIAGNOSIS — I10 Essential (primary) hypertension: Secondary | ICD-10-CM | POA: Diagnosis not present

## 2015-07-25 DIAGNOSIS — E1122 Type 2 diabetes mellitus with diabetic chronic kidney disease: Secondary | ICD-10-CM | POA: Diagnosis not present

## 2015-08-08 ENCOUNTER — Other Ambulatory Visit: Payer: Self-pay | Admitting: Family Medicine

## 2015-08-09 ENCOUNTER — Telehealth: Payer: Self-pay | Admitting: Family Medicine

## 2015-08-09 NOTE — Telephone Encounter (Signed)
Agree--scam

## 2015-08-09 NOTE — Telephone Encounter (Signed)
Pt stated she just received a call from someone saying they were with Medicare and that Dr. Rosanna Randy had ordered her to have a back brace. Pt stated that she didn't know anything about a back brace so she told them no b/c she thought it was a scam. Pt would like to verify if Dr. Rosanna Randy ordered a back brace. Please advise. Thanks TNP

## 2015-08-09 NOTE — Telephone Encounter (Signed)
I think its one of those scam places please review-aa

## 2015-08-12 NOTE — Telephone Encounter (Signed)
Pt advised-aa 

## 2015-08-13 ENCOUNTER — Ambulatory Visit (INDEPENDENT_AMBULATORY_CARE_PROVIDER_SITE_OTHER): Payer: Medicare Other | Admitting: Family Medicine

## 2015-08-13 ENCOUNTER — Encounter: Payer: Self-pay | Admitting: Family Medicine

## 2015-08-13 VITALS — BP 128/64 | HR 64 | Temp 97.7°F | Resp 16 | Ht 67.0 in | Wt 187.0 lb

## 2015-08-13 DIAGNOSIS — Z0000 Encounter for general adult medical examination without abnormal findings: Secondary | ICD-10-CM | POA: Diagnosis not present

## 2015-08-13 DIAGNOSIS — Z1239 Encounter for other screening for malignant neoplasm of breast: Secondary | ICD-10-CM

## 2015-08-13 NOTE — Patient Instructions (Signed)
Over the counter Magnesium Ox. Twice daily. Vitamin D and Fish oil daily.

## 2015-08-13 NOTE — Progress Notes (Signed)
Patient ID: Valerie Foster, female   DOB: 1933-03-04, 82 y.o.   MRN: [id number] Visit Date: 08/13/2015  Today's Provider: [name], MD   Chief Complaint  Patient presents with  . Medicare Wellness   Subjective:   Valerie Foster is a 82 y.o. female who presents today for her Subsequent Annual Wellness Visit. She feels fairly well. She reports exercising at home, walking and back exercises. She reports she is sleeping poorly, nothing new for pt.  Colonoscopy- 11/27/08 Polyps, hemorrhoids repeat 5 years Mammogram- 05/30/13 BMD- 06/14/07 Pap- 01/15/04  Immunization History  Administered Date(s) Administered  . Influenza, High Dose Seasonal PF 01/31/2015  . Pneumococcal Conjugate-13 08/29/2013  . Pneumococcal Polysaccharide-23 01/28/2006     Review of Systems  Constitutional: Negative.   HENT: Negative.   Eyes: Negative.   Respiratory: Negative.   Cardiovascular: Positive for palpitations (nothing new for pt).  Gastrointestinal: Negative.   Endocrine: Negative.   Genitourinary: Negative.   Musculoskeletal: Positive for neck pain and neck stiffness. Negative for back pain.  Skin: Negative.   Allergic/Immunologic: Negative.   Neurological: Negative.   Hematological: Negative.   Psychiatric/Behavioral: Positive for sleep disturbance.    Patient Active Problem List   Diagnosis Date Noted  . Angina pectoris ([place]) 07/18/2014  . Arthritis 07/18/2014  . CAD in native artery 07/18/2014  . Chronic anxiety 07/18/2014  . Diabetes ([place]) 07/18/2014  . BP (high blood pressure) 07/18/2014  . Heart & renal disease, hypertensive, with heart failure ([place]) 07/18/2014  . Insomnia, matutinal 07/18/2014  . Benign neoplasm of colon 05/19/2013  . Acid reflux 05/19/2013  . Hypercholesteremia 05/19/2013  . Neuropathy ([place]) 05/19/2013    Social History   Social History  . Marital Status: Married    Spouse Name: N/A  . Number of Children: N/A  . Years of Education: N/A   Occupational  History  . Not on file.   Social History Main Topics  . Smoking status: Never Smoker   . Smokeless tobacco: Never Used  . Alcohol Use: No  . Drug Use: No  . Sexual Activity: Not on file   Other Topics Concern  . Not on file   Social History Narrative    Past Surgical History  Procedure Laterality Date  . Coronary stent placement  06/17/2011  . Finger surgery      for trigger finger  . Tonsillectomy    . Cardiac surgery    . Cataract extraction w/phaco Left 01/28/2015    Procedure: CATARACT EXTRACTION PHACO AND INTRAOCULAR LENS PLACEMENT (IOC);  Surgeon: Estill Cotta, MD;  Location: [facility] ORS;  Service: Ophthalmology;  Laterality: Left;  [place] 01:13 AP% 58.5 CDE 30.55 fluid pack lot # [id number]  . Coronary angioplasty      stent  . Cataract extraction w/phaco Right 03/18/2015    Procedure: CATARACT EXTRACTION PHACO AND INTRAOCULAR LENS PLACEMENT (IOC);  Surgeon: Estill Cotta, MD;  Location: [facility] ORS;  Service: Ophthalmology;  Laterality: Right;  [place]    1:08.3 AP%   24 CDE   30.33 fluid pack lot # [id number]    Her family history includes Colon cancer in her sister; Diabetes in her mother; Heart attack in her father; Hypertension in her mother.    Outpatient Prescriptions Prior to Visit  Medication Sig Dispense Refill  . aspirin 81 MG tablet Take by mouth.    . cetirizine (ZYRTEC) 10 MG tablet Take 1 tablet (10 mg total) by mouth daily.    . cyclobenzaprine (FLEXERIL) 10 MG tablet Take  1 tablet (10 mg total) by mouth at bedtime. (Patient taking differently: Take 10 mg by mouth at bedtime. PRN) 30 tablet 5  . DUREZOL 0.05 % EMUL INSTILL 1 DROP INTO LEFT EYE TWICE DAILY AFTER PATCH IS REMOVED  0  . fluticasone (FLONASE) 50 MCG/ACT nasal spray Place into the nose.    . hydrochlorothiazide (HYDRODIURIL) 25 MG tablet TAKE 1 TABLET EVERY DAY 90 tablet 3  . meloxicam (MOBIC) 7.5 MG tablet Take by mouth.    . metFORMIN (GLUCOPHAGE) 500 MG tablet TAKE 1 TABLET DAILY 90 tablet 2   . metoprolol succinate (TOPROL-XL) 50 MG 24 hr tablet TAKE 1 TABLET EVERY DAY 90 tablet 3  . olmesartan (BENICAR) 40 MG tablet Take 1 tablet (40 mg total) by mouth daily. 30 tablet 11  . sodium chloride (OCEAN) 0.65 % SOLN nasal spray Place 2 sprays into both nostrils every 2 (two) hours while awake.  0  . gabapentin (NEURONTIN) 100 MG capsule Take 200 mg by mouth at bedtime. Reported on 08/13/2015    . ILEVRO 0.3 % ophthalmic suspension Reported on 08/13/2015  0  . predniSONE (STERAPRED UNI-PAK 21 TAB) 10 MG (21) TBPK tablet As directed (Patient not taking: Reported on 08/13/2015) 21 tablet 0   No facility-administered medications prior to visit.    Allergies  Allergen Reactions  . Sulfa Antibiotics     Patient Care Team: Jerrol Banana., MD as PCP - General (Family Medicine)  Objective:   Vitals:  Filed Vitals:   08/13/15 0958  BP: 128/64  Pulse: 64  Temp: 97.7 F (36.5 C)  TempSrc: Oral  Resp: 16  Height: 5\' 7"  (1.702 m)  Weight: 187 lb (84.823 kg)    Physical Exam  Constitutional: She is oriented to person, place, and time. She appears well-developed and well-nourished.  HENT:  Head: Normocephalic and atraumatic.  Right Ear: External ear normal.  Left Ear: External ear normal.  Nose: Nose normal.  Mouth/Throat: Oropharynx is clear and moist.  Eyes: Conjunctivae and EOM are normal. Pupils are equal, round, and reactive to light.  Neck: Normal range of motion. Neck supple.  Cardiovascular: Normal rate, regular rhythm, normal heart sounds and intact distal pulses.   Pulmonary/Chest: Effort normal and breath sounds normal.  Abdominal: Soft. Bowel sounds are normal.  Musculoskeletal: Normal range of motion.  Neurological: She is alert and oriented to person, place, and time. She has normal reflexes.  Skin: Skin is warm and dry.  Psychiatric: She has a normal mood and affect. Her behavior is normal. Judgment and thought content normal.    Activities of Daily  Living In your present state of health, do you have any difficulty performing the following activities: 08/13/2015 05/07/2015  Hearing? N N  Vision? N N  Difficulty concentrating or making decisions? N N  Walking or climbing stairs? N N  Dressing or bathing? N N  Doing errands, shopping? N N    Fall Risk Assessment Fall Risk  08/13/2015 01/31/2015  Falls in the past year? No No     Depression Screen PHQ 2/9 Scores 08/13/2015 01/31/2015  PHQ - 2 Score 0 0    Cognitive Testing - 6-CIT    Year: 0 points  Month: 0 points  Memorize "Pia Mau, [address], [place]"  Time (within 1 hour:) 0 points  Count backwards from 20: 0 points  Name months of year: 0 points  Repeat Address: 4 points   Total Score: 4/28  Interpretation : Normal (0-7)  Abnormal (8-28)    Assessment & Plan:     Annual Wellness Visit  Reviewed patient's Family Medical History Reviewed and updated list of patient's medical providers Assessment of cognitive impairment was done Assessed patient's functional ability Established a written schedule for health screening [place] Completed and Reviewed  Exercise Activities and Dietary recommendations Goals    None      Immunization History  Administered Date(s) Administered  . Influenza, High Dose Seasonal PF 01/31/2015  . Pneumococcal Conjugate-13 08/29/2013  . Pneumococcal Polysaccharide-23 01/28/2006    Health Maintenance  Topic Date Due  . FOOT EXAM  03/27/1945  . OPHTHALMOLOGY EXAM  03/27/1945  . TETANUS/TDAP  03/27/1954  . ZOSTAVAX  03/28/1995  . HEMOGLOBIN A1C  08/01/2015  . INFLUENZA VACCINE  09/24/2015  . DEXA SCAN  Completed  . PNA vac Low Risk Adult  Completed   Discussed vitamin rations. I told her it was okay to try vitamin D and Fish oil daily Discussed health benefits of physical activity, and encouraged her to engage in regular exercise appropriate for her age and condition.  Patient having nocturnal leg  cramps--try OTC magnesium oxide twice a day.  Miguel Aschoff MD [facility] 08/13/2015 10:01 AM  ------------------------------------------------------------------------------------------------------------

## 2015-09-04 ENCOUNTER — Encounter: Payer: Self-pay | Admitting: *Deleted

## 2015-09-04 ENCOUNTER — Ambulatory Visit
Admission: EM | Admit: 2015-09-04 | Discharge: 2015-09-04 | Disposition: A | Discharge status: Home / Self Care | Payer: Medicare Other | Attending: Family Medicine | Admitting: Family Medicine

## 2015-09-04 DIAGNOSIS — R21 Rash and other nonspecific skin eruption: Secondary | ICD-10-CM

## 2015-09-04 DIAGNOSIS — W57XXXA Bitten or stung by nonvenomous insect and other nonvenomous arthropods, initial encounter: Secondary | ICD-10-CM | POA: Diagnosis not present

## 2015-09-04 DIAGNOSIS — T148 Other injury of unspecified body region: Secondary | ICD-10-CM

## 2015-09-04 MED ORDER — TRIAMCINOLONE ACETONIDE 0.1 % EX CREA
1.0000 | TOPICAL_CREAM | Freq: Two times a day (BID) | CUTANEOUS | Status: AC
Start: 2015-09-04 — End: 2015-09-11

## 2015-09-04 NOTE — Discharge Instructions (Signed)
Use medication as prescribed. Avoid scratching. Monitor home for trigger as discussed.   Follow up with your primary care physician this week. Return to Urgent care for new or worsening concerns.

## 2015-09-04 NOTE — ED Provider Notes (Signed)
[facility]  ____________________________________________  Time seen: Approximately 11:52 AM  I have reviewed the triage vital signs and the nursing notes.   HISTORY  Chief Complaint Rash and Pruritis  HPI Valerie Foster is a 82 y.o. female presents with a complaint of itchy rash to bilateral arms. Patient states this is been an issue for the last 2-3 weeks. Patient reports that the rash is not constant. Patient states that she will have a few small itchy red areas to her arms that will resolve after a day or 2 and then new spots will appear. Patient states that she has also had a few itchy spots to the right side of her face that resolved, but one appeared this morning. Patient states that right arm tends to be more so affected than left arm. Denies any other rash or bites. Denies any tick bites, tick attachments, or other insect bites. Denies others in household with similar. Patient reports chief complaint is that the areas itch. Patient reports that she has been taking intermittent Benadryl which helps slightly.   Denies any changes in foods, medicines, lotions or other contacts. Denies fevers. Reports feels well otherwise. Reports continues to eat and drink well. Denies fevers, headaches, joint pains, neck or back pain, chest pain, vision changes, dizziness, weakness, shortness of breath, extremity pain or extremity swelling.  Valerie Durie, MD PCP   Past Medical History  Diagnosis Date  . Hypertension   . Diabetes mellitus without complication ([place])   . Heart disease   . Arthritis   . Dysrhythmia   . Diverticulitis   . Hypercholesteremia   . Anginal pain ([place])   . Coronary artery disease   . Palpitations     Patient Active Problem List   Diagnosis Date Noted  . Angina pectoris ([place]) 07/18/2014  . Arthritis 07/18/2014  . CAD in native artery 07/18/2014  . Chronic anxiety 07/18/2014  . Diabetes ([place]) 07/18/2014  . BP (high blood pressure) 07/18/2014  . Heart  & renal disease, hypertensive, with heart failure ([place]) 07/18/2014  . Insomnia, matutinal 07/18/2014  . Benign neoplasm of colon 05/19/2013  . Acid reflux 05/19/2013  . Hypercholesteremia 05/19/2013  . Neuropathy ([place]) 05/19/2013    Past Surgical History  Procedure Laterality Date  . Coronary stent placement  06/17/2011  . Finger surgery      for trigger finger  . Tonsillectomy    . Cardiac surgery    . Cataract extraction w/phaco Left 01/28/2015    Procedure: CATARACT EXTRACTION PHACO AND INTRAOCULAR LENS PLACEMENT (IOC);  Surgeon: Estill Cotta, MD;  Location: [facility] ORS;  Service: Ophthalmology;  Laterality: Left;  [place] 01:13 AP% 58.5 CDE 30.55 fluid pack lot # [id number]  . Coronary angioplasty      stent  . Cataract extraction w/phaco Right 03/18/2015    Procedure: CATARACT EXTRACTION PHACO AND INTRAOCULAR LENS PLACEMENT (IOC);  Surgeon: Estill Cotta, MD;  Location: [facility] ORS;  Service: Ophthalmology;  Laterality: Right;  [place]    1:08.3 AP%   24 CDE   30.33 fluid pack lot # [id number]    Current Outpatient Rx  Name  Route  Sig  Dispense  Refill  . aspirin 81 MG tablet   Oral   Take by mouth.         . hydrochlorothiazide (HYDRODIURIL) 25 MG tablet      TAKE 1 TABLET EVERY DAY   90 tablet   3   . metFORMIN (GLUCOPHAGE) 500 MG tablet  TAKE 1 TABLET DAILY   90 tablet   2   . metoprolol succinate (TOPROL-XL) 50 MG 24 hr tablet      TAKE 1 TABLET EVERY DAY   90 tablet   3   . olmesartan (BENICAR) 40 MG tablet   Oral   Take 1 tablet (40 mg total) by mouth daily.   30 tablet   11   . cetirizine (ZYRTEC) 10 MG tablet   Oral   Take 1 tablet (10 mg total) by mouth daily.         .           . DUREZOL 0.05 % EMUL      INSTILL 1 DROP INTO LEFT EYE TWICE DAILY AFTER PATCH IS REMOVED      0     Dispense as written.   . fluticasone (FLONASE) 50 MCG/ACT nasal spray   Nasal   Place into the nose.         . gabapentin (NEURONTIN) 100 MG capsule    Oral   Take 200 mg by mouth at bedtime. Reported on 08/13/2015         . ILEVRO 0.3 % ophthalmic suspension      Reported on 08/13/2015      0     Dispense as written.   . meloxicam (MOBIC) 7.5 MG tablet   Oral   Take by mouth.         . sodium chloride (OCEAN) 0.65 % SOLN nasal spray   Each Nare   Place 2 sprays into both nostrils every 2 (two) hours while awake.      0   .             Allergies Sulfa antibiotics  Family History  Problem Relation Age of Onset  . Hypertension Mother   . Diabetes Mother   . Heart attack Father   . Colon cancer Sister     Social History Social History  Substance Use Topics  . Smoking status: Never Smoker   . Smokeless tobacco: Never Used  . Alcohol Use: No    Review of Systems Constitutional: No fever/chills Eyes: No visual changes. ENT: No sore throat. Cardiovascular: Denies chest pain. Respiratory: Denies shortness of breath. Gastrointestinal: No abdominal pain.  No nausea, no vomiting.  No diarrhea.  No constipation. Genitourinary: Negative for dysuria. Musculoskeletal: Negative for back pain. Skin:Positive for rash. Neurological: Negative for headaches, focal weakness or numbness.  10-point ROS otherwise negative.  ____________________________________________   PHYSICAL EXAM:  VITAL SIGNS: ED Triage Vitals  Enc Vitals Group     BP 09/04/15 1042 180/85 mmHg     Pulse Rate 09/04/15 1042 60     Resp 09/04/15 1042 16     Temp 09/04/15 1042 98 F (36.7 C)     Temp Source 09/04/15 1042 Oral     SpO2 09/04/15 1042 100 %     Weight 09/04/15 1042 180 lb (81.647 kg)     Height 09/04/15 1042 5\' 7"  (1.702 m)     Head Cir --      Peak Flow --      Pain Score 09/04/15 1137 0     Pain Loc --      Pain Edu? --      Excl. in [place]? --   Patient reports she hasn't taken her blood pressure medications today, and will do so when she goes home.  Constitutional: Alert and oriented. Well appearing and in no  acute  distress. Eyes: Conjunctivae are normal. PERRL. EOMI. Head: Atraumatic.  Ears:Normal external appearance bilaterally.  Nose: No congestion/rhinnorhea.  Mouth/Throat: Mucous membranes are moist.  Oropharynx non-erythematous. Neck: No stridor.  No cervical spine tenderness to palpation. Hematological/Lymphatic/Immunilogical: No cervical lymphadenopathy. Cardiovascular: Normal rate, regular rhythm. Grossly normal heart sounds.  Good peripheral circulation. Respiratory: Normal respiratory effort.  No retractions. Lungs CTAB.No wheezes, rales or rhonchi.  Gastrointestinal: Soft and nontender. No distention.  Musculoskeletal: No lower or upper extremity tenderness nor edema. See skin below.  Neurologic:  Normal speech and language. No gross focal neurologic deficits are appreciated. No gait instability. Skin:  Skin is warm, dry and intact. No rash noted. Except : Left arm 3 1 cm circular mildly erythematous lesions with centered punctum, without surrounding erythema, no induration, no fluctuance, no drainage, no swelling and nontender. Right forearm and right upper arm with multiple 1 cm circular mildly erythematous lesions with centered punctum with patient actively scratching in room, few scattered similar lesions that are not erythematous with slight excoriation, no induration, no flexions, no drainage, no swelling and nontender. Right lateral face with 1 1cm circular mildly erythematous pruritic lesion.bilateral upper extremities nontender, sensation intact, nontender, full range of motion, neurovascular intact, normal capillary refill bilaterally. No other rash or lesions noted.  Psychiatric: Mood and affect are normal. Speech and behavior are normal.  ____________________________________________   LABS (all labs ordered are listed, but only abnormal results are displayed)  Labs Reviewed - No data to display ____________________________________________  INITIAL IMPRESSION / ASSESSMENT AND  PLAN / ED COURSE  Pertinent labs & imaging results that were available during my care of the patient were reviewed by me and considered in my medical decision making (see chart for details).  Very well-appearing patient. No acute distress. Presents for the complaints of itchy rash to forearms over last 2-3 weeks. Denies any changes to foods, medicines, lotions or detergents. Patient reports she will have itchy marks that resolve after a few days to forearms and then new ones appear. States itches. Clinical appearance consistent with insect bite, discussed suspect possible bedbug bites. No signs of bacterial skin infection. Discussed with patient to monitor for trigger. Avoid scratching. Will treat with topical triamcinolone cream. Encourage close monitoring, and follow up with PCP. Discussed continue to monitor blood sugar and blood pressure at home. Discussed indication, risks and benefits of medications with patient.  Discussed follow up with Primary care physician this week. Discussed follow up and return parameters including no resolution or any worsening concerns. Patient verbalized understanding and agreed to plan.   ____________________________________________   FINAL CLINICAL IMPRESSION(S) / ED DIAGNOSES  Final diagnoses:  Rash  Insect bite     Discharge Medication List as of 09/04/2015 11:27 AM    START taking these medications   Details  triamcinolone cream (KENALOG) 0.1 % Apply 1 application topically 2 (two) times daily. For one week as needed., Starting 09/04/2015, Until Wed 09/11/15, Normal        Note: This dictation was prepared with Dragon dictation along with smaller phrase technology. Any transcriptional errors that result from this process are unintentional.       Marylene Land, NP 09/04/15 1301

## 2015-09-04 NOTE — ED Notes (Signed)
Patient complains of rash and itching started 2 weeks ago. States Benadryl at home has not helped relieve symptoms. Rash spread from both arms to face.

## 2015-10-01 DIAGNOSIS — E119 Type 2 diabetes mellitus without complications: Secondary | ICD-10-CM | POA: Diagnosis not present

## 2015-10-11 ENCOUNTER — Encounter: Payer: Self-pay | Admitting: Family Medicine

## 2015-10-31 ENCOUNTER — Other Ambulatory Visit: Payer: Self-pay | Admitting: Family Medicine

## 2015-11-07 ENCOUNTER — Other Ambulatory Visit: Payer: Self-pay | Admitting: Family Medicine

## 2015-11-14 ENCOUNTER — Emergency Department
Admission: EM | Admit: 2015-11-14 | Discharge: 2015-11-15 | Disposition: A | Discharge status: Home / Self Care | Payer: Medicare Other | Attending: Emergency Medicine | Admitting: Emergency Medicine

## 2015-11-14 DIAGNOSIS — I2510 Atherosclerotic heart disease of native coronary artery without angina pectoris: Secondary | ICD-10-CM | POA: Diagnosis not present

## 2015-11-14 DIAGNOSIS — Z7982 Long term (current) use of aspirin: Secondary | ICD-10-CM | POA: Insufficient documentation

## 2015-11-14 DIAGNOSIS — Z79899 Other long term (current) drug therapy: Secondary | ICD-10-CM | POA: Diagnosis not present

## 2015-11-14 DIAGNOSIS — Z7984 Long term (current) use of oral hypoglycemic drugs: Secondary | ICD-10-CM | POA: Insufficient documentation

## 2015-11-14 DIAGNOSIS — I10 Essential (primary) hypertension: Secondary | ICD-10-CM | POA: Insufficient documentation

## 2015-11-14 DIAGNOSIS — E119 Type 2 diabetes mellitus without complications: Secondary | ICD-10-CM | POA: Diagnosis not present

## 2015-11-14 LAB — CBC
HEMATOCRIT: 42 % (ref 35.0–47.0)
HEMOGLOBIN: 14 g/dL (ref 12.0–16.0)
MCH: 28 pg (ref 26.0–34.0)
MCHC: 33.3 g/dL (ref 32.0–36.0)
MCV: 84 fL (ref 80.0–100.0)
Platelets: 221 10*3/uL (ref 150–440)
RBC: 5 MIL/uL (ref 3.80–5.20)
RDW: 14.2 % (ref 11.5–14.5)
WBC: 8.2 10*3/uL (ref 3.6–11.0)

## 2015-11-14 LAB — BASIC METABOLIC PANEL
ANION GAP: 7 (ref 5–15)
BUN: 14 mg/dL (ref 6–20)
CHLORIDE: 106 mmol/L (ref 101–111)
CO2: 28 mmol/L (ref 22–32)
CREATININE: 1 mg/dL (ref 0.44–1.00)
Calcium: 9.6 mg/dL (ref 8.9–10.3)
GFR calc non Af Amer: 52 mL/min — ABNORMAL LOW (ref >60)
Glucose, Bld: 153 mg/dL — ABNORMAL HIGH (ref 65–99)
POTASSIUM: 4.2 mmol/L (ref 3.5–5.1)
SODIUM: 141 mmol/L (ref 135–145)

## 2015-11-14 LAB — TROPONIN I

## 2015-11-14 NOTE — ED Triage Notes (Addendum)
Pt presents to ED with c/o hypertension, dizziness, lightheadedness, and "pounding in my chest". Pt reports s/x's started 2 hrs PTA. Pt reports taking her BP meds as directed. Pt denies N/V/D or fever.  Pt reports taking 2 baby aspirin, 1 dose of HCTZ, 1 dose of metoprolol, and 1 dose of benacar PTA. Pt denies any visual changes, denies unilateral weakness in her extremities.

## 2015-11-14 NOTE — ED Provider Notes (Signed)
[facility] Emergency Department Provider Note    First MD Initiated Contact with Patient 11/14/15 2308     (approximate)  I have reviewed the triage vital signs and the nursing notes.   HISTORY  Chief Complaint Hypertension   HPI Valerie Foster is a 82 y.o. female with history of coronary disease hypertension hyperlipidemia, cardiac stent placement presents to the emergency department with noted hypertension tonight at home. Patient states her blood pressure was 180 over 90s. Patient states that she took her antihypertensives and subsequently rechecked her blood pressure 15 minutes at that time is greater than 200. Patient denies any chest pains or shortness of breath during that time. Patient does however admits to feeling anxious when her pressure was high which is since resolved. Patient also admits to having a headache lightheadedness as well which is also resolved. Patient has no complaints at this time. She admits to being noncompliant with her HCTZ secondary to frequent urination at night.   Past Medical History:  Diagnosis Date  . Anginal pain ([place])   . Arthritis   . Coronary artery disease   . Diabetes mellitus without complication ([place])   . Diverticulitis   . Dysrhythmia   . Heart disease   . Hypercholesteremia   . Hypertension   . Palpitations     Patient Active Problem List   Diagnosis Date Noted  . Angina pectoris ([place]) 07/18/2014  . Arthritis 07/18/2014  . CAD in native artery 07/18/2014  . Chronic anxiety 07/18/2014  . Diabetes ([place]) 07/18/2014  . BP (high blood pressure) 07/18/2014  . Heart & renal disease, hypertensive, with heart failure ([place]) 07/18/2014  . Insomnia, matutinal 07/18/2014  . Benign neoplasm of colon 05/19/2013  . Acid reflux 05/19/2013  . Hypercholesteremia 05/19/2013  . Neuropathy ([place]) 05/19/2013    Past Surgical History:  Procedure Laterality Date  . CARDIAC SURGERY    . CATARACT EXTRACTION W/PHACO Left  01/28/2015   Procedure: CATARACT EXTRACTION PHACO AND INTRAOCULAR LENS PLACEMENT (IOC);  Surgeon: Estill Cotta, MD;  Location: [facility] ORS;  Service: Ophthalmology;  Laterality: Left;  [place] 01:13 AP% 58.5 CDE 30.55 fluid pack lot # [id number]  . CATARACT EXTRACTION W/PHACO Right 03/18/2015   Procedure: CATARACT EXTRACTION PHACO AND INTRAOCULAR LENS PLACEMENT (IOC);  Surgeon: Estill Cotta, MD;  Location: [facility] ORS;  Service: Ophthalmology;  Laterality: Right;  [place]    1:08.3 AP%   24 CDE   30.33 fluid pack lot # [id number]  . CORONARY ANGIOPLASTY     stent  . CORONARY STENT PLACEMENT  06/17/2011  . FINGER SURGERY     for trigger finger  . TONSILLECTOMY      Prior to Admission medications   Medication Sig Start Date End Date Taking? Authorizing Provider  aspirin 81 MG tablet Take by mouth.    Historical Provider, MD  cetirizine (ZYRTEC) 10 MG tablet Take 1 tablet (10 mg total) by mouth daily. 08/18/14   Olen Cordial, NP  cyclobenzaprine (FLEXERIL) 10 MG tablet Take 1 tablet (10 mg total) by mouth at bedtime. Patient taking differently: Take 10 mg by mouth at bedtime. PRN 05/07/15   Richard Maceo Pro., MD  DUREZOL 0.05 % EMUL INSTILL 1 DROP INTO LEFT EYE TWICE DAILY AFTER PATCH IS REMOVED 01/26/15   Historical Provider, MD  fluticasone (FLONASE) 50 MCG/ACT nasal spray Place into the nose. 08/29/13   Historical Provider, MD  gabapentin (NEURONTIN) 100 MG capsule Take 200 mg by mouth at bedtime. Reported on  08/13/2015 08/29/13   Historical Provider, MD  hydrochlorothiazide (HYDRODIURIL) 25 MG tablet TAKE 1 TABLET EVERY DAY 11/07/15   Jerrol Banana., MD  ILEVRO 0.3 % ophthalmic suspension Reported on 08/13/2015 01/26/15   Historical Provider, MD  meloxicam (MOBIC) 7.5 MG tablet Take by mouth. 08/29/13   Historical Provider, MD  metFORMIN (GLUCOPHAGE) 500 MG tablet TAKE 1 TABLET DAILY 04/08/15   Jerrol Banana., MD  metoprolol succinate (TOPROL-XL) 50 MG 24 hr tablet TAKE 1 TABLET EVERY  DAY 08/08/15   Jerrol Banana., MD  olmesartan (BENICAR) 20 MG tablet TAKE 1 TABLET BY MOUTH AT BEDTIME 10/31/15   Richard Maceo Pro., MD  olmesartan (BENICAR) 40 MG tablet Take 1 tablet (40 mg total) by mouth daily. 07/16/15   Earleen Newport, MD  sodium chloride (OCEAN) 0.65 % SOLN nasal spray Place 2 sprays into both nostrils every 2 (two) hours while awake. 08/18/14   Olen Cordial, NP    Allergies Sulfa antibiotics  Family History  Problem Relation Age of Onset  . Hypertension Mother   . Diabetes Mother   . Heart attack Father   . Colon cancer Sister     Social History Social History  Substance Use Topics  . Smoking status: Never Smoker  . Smokeless tobacco: Never Used  . Alcohol use No    Review of Systems Constitutional: No fever/chills Eyes: No visual changes. ENT: No sore throat. Cardiovascular: Denies chest pain. Positive palpitations (now resolved) Respiratory: Denies shortness of breath. Gastrointestinal: No abdominal pain.  No nausea, no vomiting.  No diarrhea.  No constipation. Genitourinary: Negative for dysuria. Musculoskeletal: Negative for back pain. Skin: Negative for rash. Neurological: Negative for headache and lightheadedness (now resolved)  10-point ROS otherwise negative.  ____________________________________________   PHYSICAL EXAM:  VITAL SIGNS: ED Triage Vitals  Enc Vitals Group     BP 11/14/15 2054 (!) 206/103     Pulse Rate 11/14/15 2054 100     Resp 11/14/15 2054 17     Temp 11/14/15 2054 98.5 F (36.9 C)     Temp Source 11/14/15 2054 Oral     SpO2 11/14/15 2054 99 %     Weight 11/14/15 2054 180 lb (81.6 kg)     Height 11/14/15 2054 5\' 7"  (1.702 m)     Head Circumference --      Peak Flow --      Pain Score 11/14/15 2103 0     Pain Loc --      Pain Edu? --      Excl. in [place]? --     Constitutional: Alert and oriented. Well appearing and in no acute distress. Eyes: Conjunctivae are normal. PERRL. EOMI. Head:  Atraumatic. Mouth/Throat: Mucous membranes are moist.  Oropharynx non-erythematous. Neck: No stridor.  No meningeal signs.   Cardiovascular: Normal rate, regular rhythm. Good peripheral circulation. Grossly normal heart sounds. Respiratory: Normal respiratory effort.  No retractions. Lungs CTAB. Gastrointestinal: Soft and nontender. No distention.  Musculoskeletal: No lower extremity tenderness nor edema. No gross deformities of extremities. Neurologic:  Normal speech and language. No gross focal neurologic deficits are appreciated.  Skin:  Skin is warm, dry and intact. No rash noted. Psychiatric: Mood and affect are normal. Speech and behavior are normal.  ____________________________________________   LABS (all labs ordered are listed, but only abnormal results are displayed)  Labs Reviewed  BASIC METABOLIC PANEL - Abnormal; Notable for the following:       Result Value  Glucose, Bld 153 (*)    GFR calc non Af Amer 52 (*)    All other components within normal limits  CBC  TROPONIN I  TROPONIN I   ____________________________________________  EKG  ED ECG REPORT I, [place] N Yevonne Yokum, the attending physician, personally viewed and interpreted this ECG.   Date: 11/14/2015  EKG Time: 20:53  Rate: 90  Rhythm: Normal Sinus Rhythm  Axis: Normal  Intervals:Normal  ST&T Change: None   Procedures      INITIAL IMPRESSION / ASSESSMENT AND PLAN / ED COURSE  Pertinent labs & imaging results that were available during my care of the patient were reviewed by me and considered in my medical decision making (see chart for details).  EKG unremarkable laboratory data likewise. Patient has no complaints at this time current blood pressure 124/75 status post clonidine 0.1 mg. Patient advised to follow-up with Dr. Rosanna Randy today for continued hypertension control.   Clinical Course    ____________________________________________  FINAL CLINICAL IMPRESSION(S) / ED  DIAGNOSES  Final diagnoses:  Essential hypertension     MEDICATIONS GIVEN DURING THIS VISIT:  Medications - No data to display   NEW OUTPATIENT MEDICATIONS STARTED DURING THIS VISIT:  New Prescriptions   No medications on file    Modified Medications   No medications on file    Discontinued Medications   No medications on file     Note:  This document was prepared using Dragon voice recognition software and may include unintentional dictation errors.    Gregor Hams, MD 11/15/15 [phone]

## 2015-11-15 ENCOUNTER — Ambulatory Visit (INDEPENDENT_AMBULATORY_CARE_PROVIDER_SITE_OTHER): Payer: Medicare Other | Admitting: Family Medicine

## 2015-11-15 ENCOUNTER — Encounter: Payer: Self-pay | Admitting: Family Medicine

## 2015-11-15 VITALS — BP 154/90 | HR 60 | Temp 97.5°F | Resp 16 | Wt 185.0 lb

## 2015-11-15 DIAGNOSIS — I10 Essential (primary) hypertension: Secondary | ICD-10-CM | POA: Diagnosis not present

## 2015-11-15 DIAGNOSIS — R350 Frequency of micturition: Secondary | ICD-10-CM | POA: Diagnosis not present

## 2015-11-15 DIAGNOSIS — R739 Hyperglycemia, unspecified: Secondary | ICD-10-CM

## 2015-11-15 DIAGNOSIS — F419 Anxiety disorder, unspecified: Secondary | ICD-10-CM | POA: Diagnosis not present

## 2015-11-15 LAB — POCT URINALYSIS DIPSTICK
Bilirubin, UA: NEGATIVE
Blood, UA: NEGATIVE
Glucose, UA: NEGATIVE
KETONES UA: NEGATIVE
Nitrite, UA: NEGATIVE
PH UA: 7
PROTEIN UA: NEGATIVE
SPEC GRAV UA: 1.01
UROBILINOGEN UA: 0.2

## 2015-11-15 LAB — TROPONIN I: Troponin I: <0.03 ng/mL (ref <0.03)

## 2015-11-15 LAB — POCT GLYCOSYLATED HEMOGLOBIN (HGB A1C)
Est. average glucose Bld gHb Est-mCnc: 157
Hemoglobin A1C: 7.1

## 2015-11-15 MED ORDER — LOSARTAN POTASSIUM 100 MG PO TABS: 100.0000 mg | ORAL_TABLET | Freq: Every day | ORAL | 3 refills | Status: DC

## 2015-11-15 MED ORDER — LORAZEPAM 1 MG PO TABS
ORAL_TABLET | ORAL | Status: AC
  Administered 2015-11-15: 1 mg via ORAL
  Filled 2015-11-15: qty 1

## 2015-11-15 MED ORDER — CLONIDINE HCL 0.1 MG PO TABS
ORAL_TABLET | ORAL | Status: AC
  Administered 2015-11-15: 0.1 mg via ORAL
  Filled 2015-11-15: qty 1

## 2015-11-15 MED ORDER — CLONIDINE HCL 0.1 MG PO TABS
0.1000 mg | ORAL_TABLET | Freq: Once | ORAL | Status: AC
  Administered 2015-11-15: 0.1 mg via ORAL

## 2015-11-15 MED ORDER — LORAZEPAM 1 MG PO TABS
1.0000 mg | ORAL_TABLET | Freq: Once | ORAL | Status: AC
  Administered 2015-11-15: 1 mg via ORAL

## 2015-11-15 MED ORDER — LORAZEPAM 0.5 MG PO TABS: 0.5000 mg | ORAL_TABLET | Freq: Two times a day (BID) | ORAL | 5 refills | Status: DC | PRN

## 2015-11-15 NOTE — ED Notes (Signed)
Pt discharged to home.  Family member driving.  Discharge instructions reviewed.  Verbalized understanding.  No questions or concerns at this time.  Teach back verified.  Pt in NAD.  No items left in ED.   

## 2015-11-15 NOTE — Progress Notes (Signed)
Patient: Valerie Foster Female    DOB: 02-03-1934   81 y.o.   MRN: [id number] Visit Date: 11/15/2015  Today's Provider: [name], MD   Chief Complaint  Patient presents with  . Hypertension   Subjective:    HPI      Hypertension, follow-up:  BP Readings from Last 3 Encounters:  11/15/15 (!) 154/90  11/15/15 124/75  09/04/15 (!) 191/95    She was last seen in this office for hypertension 3 months ago.  BP at that visit was 128/60. Management since that visit includes none. She reports fair compliance with treatment. Does take HCTZ QOD due to polyuria. She is having side effects. Polyuria. She is not exercising. She is adherent to low salt diet.   Outside blood pressures are high. Was seen in ED last night. BP was 206/103. Pt was given Clonidine and Lorazepam 1 mg tablet. Pt reports the Lorazepam helped pt to calm down, and would like a prescription. She is experiencing fatigue, irregular heart beat, lower extremity edema and palpitations.  Patient denies chest pain, chest pressure/discomfort, dyspnea, exertional chest pressure/discomfort, near-syncope, orthopnea and syncope.   Cardiovascular risk factors include advanced age (older than 16 for men, 40 for women), diabetes mellitus, dyslipidemia and hypertension.      Weight trend: increasing steadily Wt Readings from Last 3 Encounters:  11/15/15 185 lb (83.9 kg)  11/14/15 180 lb (81.6 kg)  09/04/15 180 lb (81.6 kg)    Current diet: in general, a "healthy" diet    ------------------------------------------------------------------------    Allergies  Allergen Reactions  . Sulfa Antibiotics      Current Outpatient Prescriptions:  .  hydrochlorothiazide (HYDRODIURIL) 25 MG tablet, TAKE 1 TABLET EVERY DAY, Disp: 90 tablet, Rfl: 3 .  metFORMIN (GLUCOPHAGE) 500 MG tablet, TAKE 1 TABLET DAILY, Disp: 90 tablet, Rfl: 2 .  metoprolol succinate (TOPROL-XL) 50 MG 24 hr tablet, TAKE 1 TABLET EVERY DAY,  Disp: 90 tablet, Rfl: 3 .  olmesartan (BENICAR) 20 MG tablet, , Disp: , Rfl:  .  aspirin 81 MG tablet, Take by mouth., Disp: , Rfl:  .  cetirizine (ZYRTEC) 10 MG tablet, Take 1 tablet (10 mg total) by mouth daily. (Patient not taking: Reported on 11/15/2015), Disp: , Rfl:  .  cyclobenzaprine (FLEXERIL) 10 MG tablet, Take 1 tablet (10 mg total) by mouth at bedtime. (Patient not taking: Reported on 11/15/2015), Disp: 30 tablet, Rfl: 5 .  DUREZOL 0.05 % EMUL, INSTILL 1 DROP INTO LEFT EYE TWICE DAILY AFTER PATCH IS REMOVED, Disp: , Rfl: 0 .  fluticasone (FLONASE) 50 MCG/ACT nasal spray, Place into the nose., Disp: , Rfl:  .  gabapentin (NEURONTIN) 100 MG capsule, Take 200 mg by mouth at bedtime. Reported on 08/13/2015, Disp: , Rfl:  .  meloxicam (MOBIC) 7.5 MG tablet, Take by mouth., Disp: , Rfl:  .  sodium chloride (OCEAN) 0.65 % SOLN nasal spray, Place 2 sprays into both nostrils every 2 (two) hours while awake. (Patient not taking: Reported on 11/15/2015), Disp: , Rfl: 0  Review of Systems  Constitutional: Positive for fatigue. Negative for activity change, appetite change, chills, diaphoresis and fever.  Respiratory: Positive for cough. Negative for shortness of breath.   Cardiovascular: Positive for leg swelling. Negative for chest pain and palpitations.  Allergic/Immunologic: Negative.   Neurological: Negative.   Hematological: Negative.   Psychiatric/Behavioral: Negative.     Social History  Substance Use Topics  . Smoking status: Never Smoker  .  Smokeless tobacco: Never Used  . Alcohol use No   Objective:   BP (!) 154/90 (BP Location: Right Arm, Patient Position: Sitting, Cuff Size: Large)   Pulse 60   Temp 97.5 F (36.4 C) (Oral)   Resp 16   Wt 185 lb (83.9 kg)   BMI 28.98 kg/m   Physical Exam  Constitutional: She is oriented to person, place, and time. She appears well-developed and well-nourished.  HENT:  Head: Normocephalic and atraumatic.  Right Ear: External ear normal.   Left Ear: External ear normal.  Cardiovascular: Normal rate, regular rhythm and normal heart sounds.   Pulmonary/Chest: Effort normal. No respiratory distress. She has no wheezes.  Abdominal: Soft. There is no tenderness.  Neurological: She is alert and oriented to person, place, and time.  Skin: Skin is warm and dry.  Psychiatric: She has a normal mood and affect. Her behavior is normal. Thought content normal.        Assessment & Plan:     1. Essential hypertension Consider Clonidine if BP still elevated at 2-3 week FU. Change Benicar to Losartan as below. - losartan (COZAAR) 100 MG tablet; Take 1 tablet (100 mg total) by mouth daily.  Dispense: 90 tablet; Refill: 3  2. Chronic anxiety Worsening. Add med as below. FU 2-3 weeks. - LORazepam (ATIVAN) 0.5 MG tablet; Take 1 tablet (0.5 mg total) by mouth 2 (two) times daily as needed for anxiety.  Dispense: 60 tablet; Refill: 5   3. Urinary frequency Send urine for cx. FU pending results. - POCT glycosylated hemoglobin (Hb A1C) - Urine culture - POCT urinalysis dipstick Results for orders placed or performed in visit on 11/15/15  POCT glycosylated hemoglobin (Hb A1C)  Result Value Ref Range   Hemoglobin A1C 7.1    Est. average glucose Bld gHb Est-mCnc 157   POCT urinalysis dipstick  Result Value Ref Range   Color, UA amber    Clarity, UA clear    Glucose, UA negative    Bilirubin, UA Negative    Ketones, UA Negative    Spec Grav, UA 1.010    Blood, UA Negative    pH, UA 7.0    Protein, UA Negative    Urobilinogen, UA 0.2    Nitrite, UA Negative    Leukocytes, UA small (1+) (A) Negative     4. Hyperglycemia A1C 7.1%. - POCT glycosylated hemoglobin (Hb A1C)    Patient seen and examined by Miguel Aschoff, MD, and note scribed by Renaldo Fiddler, CMA.   Tamsin Nader Cranford Mon, MD  [facility]

## 2015-11-16 LAB — URINE CULTURE

## 2015-11-16 LAB — PLEASE NOTE

## 2015-11-21 DIAGNOSIS — R42 Dizziness and giddiness: Secondary | ICD-10-CM | POA: Diagnosis not present

## 2015-11-21 DIAGNOSIS — I208 Other forms of angina pectoris: Secondary | ICD-10-CM | POA: Diagnosis not present

## 2015-11-21 DIAGNOSIS — E784 Other hyperlipidemia: Secondary | ICD-10-CM | POA: Diagnosis not present

## 2015-11-21 DIAGNOSIS — F419 Anxiety disorder, unspecified: Secondary | ICD-10-CM | POA: Diagnosis not present

## 2015-11-21 DIAGNOSIS — I10 Essential (primary) hypertension: Secondary | ICD-10-CM | POA: Diagnosis not present

## 2015-11-21 DIAGNOSIS — K219 Gastro-esophageal reflux disease without esophagitis: Secondary | ICD-10-CM | POA: Diagnosis not present

## 2015-11-21 DIAGNOSIS — E119 Type 2 diabetes mellitus without complications: Secondary | ICD-10-CM | POA: Diagnosis not present

## 2015-11-21 DIAGNOSIS — R0602 Shortness of breath: Secondary | ICD-10-CM | POA: Diagnosis not present

## 2015-12-05 ENCOUNTER — Ambulatory Visit: Payer: Medicare Other | Admitting: Family Medicine

## 2015-12-12 DIAGNOSIS — I208 Other forms of angina pectoris: Secondary | ICD-10-CM | POA: Diagnosis not present

## 2015-12-12 DIAGNOSIS — R0602 Shortness of breath: Secondary | ICD-10-CM | POA: Diagnosis not present

## 2015-12-16 DIAGNOSIS — R42 Dizziness and giddiness: Secondary | ICD-10-CM | POA: Diagnosis not present

## 2015-12-16 DIAGNOSIS — R000 Tachycardia, unspecified: Secondary | ICD-10-CM | POA: Diagnosis not present

## 2015-12-16 DIAGNOSIS — E784 Other hyperlipidemia: Secondary | ICD-10-CM | POA: Diagnosis not present

## 2015-12-16 DIAGNOSIS — R0602 Shortness of breath: Secondary | ICD-10-CM | POA: Diagnosis not present

## 2015-12-16 DIAGNOSIS — E119 Type 2 diabetes mellitus without complications: Secondary | ICD-10-CM | POA: Diagnosis not present

## 2015-12-16 DIAGNOSIS — F419 Anxiety disorder, unspecified: Secondary | ICD-10-CM | POA: Diagnosis not present

## 2015-12-16 DIAGNOSIS — I208 Other forms of angina pectoris: Secondary | ICD-10-CM | POA: Diagnosis not present

## 2015-12-16 DIAGNOSIS — I2510 Atherosclerotic heart disease of native coronary artery without angina pectoris: Secondary | ICD-10-CM | POA: Diagnosis not present

## 2015-12-16 DIAGNOSIS — I10 Essential (primary) hypertension: Secondary | ICD-10-CM | POA: Diagnosis not present

## 2015-12-16 DIAGNOSIS — K219 Gastro-esophageal reflux disease without esophagitis: Secondary | ICD-10-CM | POA: Diagnosis not present

## 2015-12-31 ENCOUNTER — Ambulatory Visit (INDEPENDENT_AMBULATORY_CARE_PROVIDER_SITE_OTHER): Payer: Medicare Other | Admitting: Family Medicine

## 2015-12-31 ENCOUNTER — Encounter: Payer: Self-pay | Admitting: Family Medicine

## 2015-12-31 VITALS — BP 134/72 | HR 62 | Temp 98.1°F | Resp 16 | Wt 188.0 lb

## 2015-12-31 DIAGNOSIS — I10 Essential (primary) hypertension: Secondary | ICD-10-CM

## 2015-12-31 DIAGNOSIS — F5109 Other insomnia not due to a substance or known physiological condition: Secondary | ICD-10-CM

## 2015-12-31 DIAGNOSIS — Z23 Encounter for immunization: Secondary | ICD-10-CM | POA: Diagnosis not present

## 2015-12-31 DIAGNOSIS — F419 Anxiety disorder, unspecified: Secondary | ICD-10-CM

## 2015-12-31 MED ORDER — TRAZODONE HCL 50 MG PO TABS: 50.0000 mg | ORAL_TABLET | Freq: Every evening | ORAL | 12 refills | Status: DC | PRN

## 2015-12-31 MED ORDER — OLMESARTAN MEDOXOMIL 40 MG PO TABS
40.0000 mg | ORAL_TABLET | Freq: Every day | ORAL | 12 refills | Status: DC
Start: 2015-12-31 — End: 2017-02-09

## 2015-12-31 NOTE — Progress Notes (Signed)
Subjective:  HPI  Hypertension, follow-up:  BP Readings from Last 3 Encounters:  12/31/15 134/72  11/15/15 (!) 154/90  11/15/15 124/75    She was last seen for hypertension 1 months ago.  BP at that visit was 154/90. Management since that visit includes Stop Benicar and Start Losartan. Dr. Clayborn Bigness has since added Clonidine 0.1mg  twice daily. Pt sees that her BP drops if she takes it twice a day. She wants to know if she takes her BP and it is normal does she need to take the Clonidine.   She reports good compliance with treatment. She is not having side effects. Has some dizziness when her BP is too high or low.  Outside blood pressures are up and down but usually 135-170's/70-90's. She is experiencing none.  Patient denies chest pain, chest pressure/discomfort, claudication, dyspnea, exertional chest pressure/discomfort, fatigue, irregular heart beat, lower extremity edema, near-syncope, orthopnea, palpitations, paroxysmal nocturnal dyspnea, syncope and tachypnea.    Wt Readings from Last 3 Encounters:  12/31/15 188 lb (85.3 kg)  11/15/15 185 lb (83.9 kg)  11/14/15 180 lb (81.6 kg)   ------------------------------------------------------------------------  Anxiety- Pt was given Lorazepam at last OV. She reports that it makes her have a flushed feeling so she has not been taking it unless she absolutely needs it. She does not feel like she has time to worried about stuff because she is running so much. She is still not sleeping well. She can get to sleep but can not stay asleep.   Prior to Admission medications   Medication Sig Start Date End Date Taking? Authorizing Provider  aspirin 81 MG tablet Take by mouth.    Historical Provider, MD  cetirizine (ZYRTEC) 10 MG tablet Take 1 tablet (10 mg total) by mouth daily. Patient not taking: Reported on 11/15/2015 08/18/14   Olen Cordial, NP  cyclobenzaprine (FLEXERIL) 10 MG tablet Take 1 tablet (10 mg total) by mouth at  bedtime. Patient not taking: Reported on 11/15/2015 05/07/15   Jerrol Banana., MD  DUREZOL 0.05 % EMUL INSTILL 1 DROP INTO LEFT EYE TWICE DAILY AFTER PATCH IS REMOVED 01/26/15   Historical Provider, MD  fluticasone (FLONASE) 50 MCG/ACT nasal spray Place into the nose. 08/29/13   Historical Provider, MD  gabapentin (NEURONTIN) 100 MG capsule Take 200 mg by mouth at bedtime. Reported on 08/13/2015 08/29/13   Historical Provider, MD  hydrochlorothiazide (HYDRODIURIL) 25 MG tablet TAKE 1 TABLET EVERY DAY 11/07/15   Richard Maceo Pro., MD  LORazepam (ATIVAN) 0.5 MG tablet Take 1 tablet (0.5 mg total) by mouth 2 (two) times daily as needed for anxiety. 11/15/15   Richard Maceo Pro., MD  losartan (COZAAR) 100 MG tablet Take 1 tablet (100 mg total) by mouth daily. 11/15/15   Richard Maceo Pro., MD  meloxicam (MOBIC) 7.5 MG tablet Take by mouth. 08/29/13   Historical Provider, MD  metFORMIN (GLUCOPHAGE) 500 MG tablet TAKE 1 TABLET DAILY 04/08/15   Jerrol Banana., MD  metoprolol succinate (TOPROL-XL) 50 MG 24 hr tablet TAKE 1 TABLET EVERY DAY 08/08/15   Richard Maceo Pro., MD  sodium chloride (OCEAN) 0.65 % SOLN nasal spray Place 2 sprays into both nostrils every 2 (two) hours while awake. Patient not taking: Reported on 11/15/2015 08/18/14   Olen Cordial, NP    Patient Active Problem List   Diagnosis Date Noted  . Angina pectoris ([place]) 07/18/2014  . Arthritis 07/18/2014  . CAD in native artery  07/18/2014  . Chronic anxiety 07/18/2014  . Diabetes ([place]) 07/18/2014  . BP (high blood pressure) 07/18/2014  . Heart & renal disease, hypertensive, with heart failure ([place]) 07/18/2014  . Insomnia, matutinal 07/18/2014  . Benign neoplasm of colon 05/19/2013  . Acid reflux 05/19/2013  . Hypercholesteremia 05/19/2013  . Neuropathy ([place]) 05/19/2013    Past Medical History:  Diagnosis Date  . Anginal pain ([place])   . Arthritis   . Coronary artery disease   . Diabetes mellitus without  complication ([place])   . Diverticulitis   . Dysrhythmia   . Heart disease   . Hypercholesteremia   . Hypertension   . Palpitations     Social History   Social History  . Marital status: Married    Spouse name: N/A  . Number of children: N/A  . Years of education: N/A   Occupational History  . Not on file.   Social History Main Topics  . Smoking status: Never Smoker  . Smokeless tobacco: Never Used  . Alcohol use No  . Drug use: No  . Sexual activity: Not on file   Other Topics Concern  . Not on file   Social History Narrative  . No narrative on file    Allergies  Allergen Reactions  . Sulfa Antibiotics     Review of Systems  Constitutional: Negative.   HENT: Negative.   Eyes: Negative.   Respiratory: Negative.   Cardiovascular: Negative.   Gastrointestinal: Negative.   Genitourinary: Negative.   Musculoskeletal: Negative.   Skin: Negative.   Neurological: Positive for dizziness.  Endo/Heme/Allergies: Negative.   Psychiatric/Behavioral: The patient is nervous/anxious.     Immunization History  Administered Date(s) Administered  . Influenza, High Dose Seasonal PF 01/31/2015  . Pneumococcal Conjugate-13 08/29/2013  . Pneumococcal Polysaccharide-23 01/28/2006    Objective:  BP 134/72 (BP Location: Left Arm, Patient Position: Sitting, Cuff Size: Normal)   Pulse 62   Temp 98.1 F (36.7 C) (Oral)   Resp 16   Wt 188 lb (85.3 kg)   BMI 29.44 kg/m   Physical Exam  Constitutional: She is oriented to person, place, and time and well-developed, well-nourished, and in no distress.  HENT:  Head: Normocephalic and atraumatic.  Eyes: Conjunctivae are normal. No scleral icterus.  Neck: Neck supple. No thyromegaly present.  Cardiovascular: Normal rate, regular rhythm and normal heart sounds.   Pulmonary/Chest: Effort normal and breath sounds normal.  Abdominal: Soft.  Neurological: She is alert and oriented to person, place, and time. Gait normal.  Skin:  Skin is warm and dry.  Psychiatric: Mood, memory, affect and judgment normal.    Lab Results  Component Value Date   WBC 8.2 11/14/2015   HGB 14.0 11/14/2015   HCT 42.0 11/14/2015   PLT 221 11/14/2015   GLUCOSE 153 (H) 11/14/2015   CHOL 295 (H) 05/21/2015   TRIG 331 (H) 05/21/2015   HDL 50 05/21/2015   LDLCALC 179 (H) 05/21/2015   TSH 4.550 (H) 05/21/2015   HGBA1C 7.1 11/15/2015    CMP     Component Value Date/Time   NA 141 11/14/2015 2233   NA 144 05/21/2015 0954   NA 141 06/18/2012 0206   K 4.2 11/14/2015 2233   K 3.7 06/18/2012 0206   CL 106 11/14/2015 2233   CL 105 06/18/2012 0206   CO2 28 11/14/2015 2233   CO2 31 06/18/2012 0206   GLUCOSE 153 (H) 11/14/2015 2233   GLUCOSE 109 (H) 06/18/2012 0206   BUN  14 11/14/2015 2233   BUN 16 05/21/2015 0954   BUN 20 (H) 06/18/2012 0206   CREATININE 1.00 11/14/2015 2233   CREATININE 1.13 06/18/2012 0206   CALCIUM 9.6 11/14/2015 2233   CALCIUM 9.6 06/18/2012 0206   PROT 7.1 07/16/2015 2018   PROT 6.4 05/21/2015 0954   PROT 7.4 06/18/2012 0206   ALBUMIN 4.4 07/16/2015 2018   ALBUMIN 4.3 05/21/2015 0954   ALBUMIN 4.0 06/18/2012 0206   AST 25 07/16/2015 2018   AST 20 06/18/2012 0206   ALT 29 07/16/2015 2018   ALT 19 06/18/2012 0206   ALKPHOS 58 07/16/2015 2018   ALKPHOS 74 06/18/2012 0206   BILITOT 0.4 07/16/2015 2018   BILITOT 0.4 05/21/2015 0954   BILITOT 0.4 06/18/2012 0206   GFRNONAA 52 (L) 11/14/2015 2233   GFRNONAA 47 (L) 06/18/2012 0206   GFRAA >60 11/14/2015 2233   GFRAA 54 (L) 06/18/2012 0206    Assessment and Plan :  1. Chronic anxiety  2. Essential hypertension Change back to Benicar. Cardiology added Clonidine and when taking twice a day, BP dropping too low. Will take Clonidone PRN and change back to Benicar. D/c Losartan.  - olmesartan (BENICAR) 40 MG tablet; Take 1 tablet (40 mg total) by mouth daily.  Dispense: 30 tablet; Refill: 12  3. Need for influenza vaccination Flu vaccine HIGH DOSE  PF  4. Insomnia - traZODone (DESYREL) 50 MG tablet; Take 1 tablet (50 mg total) by mouth at bedtime as needed for sleep.  Dispense: 30 tablet; Refill: 12  HPI, Exam, and A&P Transcribed under the direction and in the presence of Richard L. Cranford Mon, MD  Electronically Signed: Webb Laws, CMA I have done the exam and reviewed the above chart and it is accurate to the best of my knowledge.  Miguel Aschoff MD [facility] 12/31/2015 2:18 PM

## 2015-12-31 NOTE — Patient Instructions (Addendum)
Re start Benicar and stop Losartan

## 2016-01-13 ENCOUNTER — Encounter: Payer: Self-pay | Admitting: Family Medicine

## 2016-01-13 ENCOUNTER — Ambulatory Visit (INDEPENDENT_AMBULATORY_CARE_PROVIDER_SITE_OTHER): Payer: Medicare Other | Admitting: Family Medicine

## 2016-01-13 VITALS — BP 172/82 | HR 68 | Temp 98.0°F | Resp 16

## 2016-01-13 DIAGNOSIS — I10 Essential (primary) hypertension: Secondary | ICD-10-CM | POA: Diagnosis not present

## 2016-01-13 DIAGNOSIS — E785 Hyperlipidemia, unspecified: Secondary | ICD-10-CM

## 2016-01-13 MED ORDER — AMLODIPINE BESYLATE 5 MG PO TABS: 5.0000 mg | ORAL_TABLET | Freq: Every day | ORAL | 3 refills | Status: DC

## 2016-01-13 MED ORDER — PITAVASTATIN CALCIUM 4 MG PO TABS: 4.0000 mg | ORAL_TABLET | Freq: Every day | ORAL | 0 refills | Status: DC

## 2016-01-13 NOTE — Patient Instructions (Addendum)
   Try OTC Conezyme Q10 200mg  a day with Cholesterol Medication (Livalo)   You start Livalo by taking 1/2 tablet daily for a few weeks to make sure you tolerate it.

## 2016-01-13 NOTE — Progress Notes (Signed)
Patient: Valerie Foster Female    DOB: 1933-10-16   82 y.o.   MRN: [id number] Visit Date: 01/13/2016  Today's Provider: Lelon Huh, MD   Chief Complaint  Patient presents with  . Hypertension   Subjective:    HPI Valerie Foster is a patient of Dr. Alben Spittle who presents complaining of elevated blood pressure. She reports that this morning when she checked it was 183/99. Patient reports that she does have a mild headache. She also reports that her blood pressure has been uncontrolled for the last 2 weeks. She was seen in the office on 12/31/15 by Dr. Rosanna Randy for elevated BP. It was recommended that she change back to Benicar and D/C Loasartan and take Clonidine as needed. Patient reports that she has been taking her BP medication (Benicar) at night, and when she wakes up in the morning her BP is already high. She reports that it averages 180-170s/90s. She reports that 3 days ago (Friday night) she called EMS due to her BP being 240/118. She reports that they did an EKG and it was normal per patient. Patient denies any chest pain, shortness of breath, or dizziness currently. She reports that she has taken Benicar last night, and her other routine medication this morning.   She does have history of CAD and hyperlipemia and is at high risk for systemic vascular disease, including renal vascular disease. She has been intolerant to several stating in the past, but she would really like to get something to lower her cholesterol.     Allergies  Allergen Reactions  . Sulfa Antibiotics      Current Outpatient Prescriptions:  .  aspirin 81 MG tablet, Take by mouth., Disp: , Rfl:  .  cloNIDine (CATAPRES) 0.1 MG tablet, Take by mouth., Disp: , Rfl:  .  DUREZOL 0.05 % EMUL, INSTILL 1 DROP INTO LEFT EYE TWICE DAILY AFTER PATCH IS REMOVED, Disp: , Rfl: 0 .  fluticasone (FLONASE) 50 MCG/ACT nasal spray, Place into the nose., Disp: , Rfl:  .  gabapentin (NEURONTIN) 100 MG capsule, Take 200 mg by mouth  at bedtime. Reported on 08/13/2015, Disp: , Rfl:  .  hydrochlorothiazide (HYDRODIURIL) 25 MG tablet, TAKE 1 TABLET EVERY DAY, Disp: 90 tablet, Rfl: 3 .  LORazepam (ATIVAN) 0.5 MG tablet, Take 1 tablet (0.5 mg total) by mouth 2 (two) times daily as needed for anxiety., Disp: 60 tablet, Rfl: 5 .  meloxicam (MOBIC) 7.5 MG tablet, Take by mouth., Disp: , Rfl:  .  metFORMIN (GLUCOPHAGE) 500 MG tablet, TAKE 1 TABLET DAILY, Disp: 90 tablet, Rfl: 2 .  metoprolol succinate (TOPROL-XL) 50 MG 24 hr tablet, TAKE 1 TABLET EVERY DAY, Disp: 90 tablet, Rfl: 3 .  olmesartan (BENICAR) 40 MG tablet, Take 1 tablet (40 mg total) by mouth daily., Disp: 30 tablet, Rfl: 12 .  traZODone (DESYREL) 50 MG tablet, Take 1 tablet (50 mg total) by mouth at bedtime as needed for sleep., Disp: 30 tablet, Rfl: 12 .  cetirizine (ZYRTEC) 10 MG tablet, Take 1 tablet (10 mg total) by mouth daily. (Patient not taking: Reported on 01/13/2016), Disp: , Rfl:  .  cyclobenzaprine (FLEXERIL) 10 MG tablet, Take 1 tablet (10 mg total) by mouth at bedtime. (Patient not taking: Reported on 01/13/2016), Disp: 30 tablet, Rfl: 5 .  sodium chloride (OCEAN) 0.65 % SOLN nasal spray, Place 2 sprays into both nostrils every 2 (two) hours while awake. (Patient not taking: Reported on 01/13/2016), Disp: , Rfl: 0  Review of Systems  Constitutional: Positive for fatigue. Negative for activity change, appetite change, chills, diaphoresis, fever and unexpected weight change.  Respiratory: Negative.   Cardiovascular: Negative for chest pain, palpitations and leg swelling.  Musculoskeletal: Negative.   Neurological: Positive for headaches. Negative for dizziness, tremors, seizures, syncope, facial asymmetry, speech difficulty, weakness, light-headedness and numbness.  Psychiatric/Behavioral: Negative.     Social History  Substance Use Topics  . Smoking status: Never Smoker  . Smokeless tobacco: Never Used  . Alcohol use No   Objective:   BP (!) 172/82  (BP Location: Left Arm, Patient Position: Sitting, Cuff Size: Normal)   Pulse 68   Temp 98 F (36.7 C)   Resp 16   Physical Exam   General Appearance:    Alert, cooperative, no distress  Eyes:    PERRL, conjunctiva/corneas clear, EOM's intact       Lungs:     Clear to auscultation bilaterally, respirations unlabored  Heart:    Regular rate and rhythm  Neurologic:   Awake, alert, oriented x 3. No apparent focal neurological           defect.          Assessment & Plan:     1. Malignant hypertension Add amlodipine - amLODipine (NORVASC) 5 MG tablet; Take 1 tablet (5 mg total) by mouth daily.  Dispense: 30 tablet; Refill: 3 - VAS US RENAL ARTERY DUPLEX; Future  2. Hyperlipidemia, unspecified hyperlipidemia type She would like to try another cholesterol medications. Advised her to start with 1/2 tablet daily, and to take with 200mg  CoQ10 to see if this would help minimize side effects.  - Pitavastatin Calcium (LIVALO) 4 MG TABS; Take 1 tablet (4 mg total) by mouth daily.  Dispense: 14 tablet; Refill: 0  She is to follow up with Dr. Rosanna Randy in December as already scheduled.       Lelon Huh, MD  [facility]

## 2016-01-29 ENCOUNTER — Ambulatory Visit (INDEPENDENT_AMBULATORY_CARE_PROVIDER_SITE_OTHER): Payer: Medicare Other | Admitting: Family Medicine

## 2016-01-29 ENCOUNTER — Encounter: Payer: Self-pay | Admitting: Family Medicine

## 2016-01-29 VITALS — BP 164/80 | HR 68 | Temp 97.9°F | Resp 16 | Wt 185.0 lb

## 2016-01-29 DIAGNOSIS — R350 Frequency of micturition: Secondary | ICD-10-CM | POA: Diagnosis not present

## 2016-01-29 DIAGNOSIS — E785 Hyperlipidemia, unspecified: Secondary | ICD-10-CM

## 2016-01-29 DIAGNOSIS — I10 Essential (primary) hypertension: Secondary | ICD-10-CM | POA: Diagnosis not present

## 2016-01-29 LAB — POCT URINALYSIS DIPSTICK
Bilirubin, UA: NEGATIVE
Blood, UA: NEGATIVE
Glucose, UA: NEGATIVE
KETONES UA: NEGATIVE
LEUKOCYTES UA: NEGATIVE
NITRITE UA: NEGATIVE
PH UA: 6
PROTEIN UA: NEGATIVE
Spec Grav, UA: 1.02
UROBILINOGEN UA: 0.2

## 2016-01-29 NOTE — Progress Notes (Signed)
Subjective:  HPI  Hypertension, follow-up:  BP Readings from Last 3 Encounters:  01/29/16 (!) 164/80  01/13/16 (!) 172/82  12/31/15 134/72    She was last seen for hypertension 2 weeks ago.  BP at that visit was 172/82. Management since that visit includes added amlodipine. She reports good compliance with treatment. She is not having side effects.  Outside blood pressures are running 160's/170's/70's sometimes then lower in the mornings. She is experiencing palpitations when her BP is elevated. She recently saw Dr. Clayborn Bigness, which is who gave her the Clonidine.  Patient denies chest pain, chest pressure/discomfort, claudication, dyspnea, exertional chest pressure/discomfort, orthopnea, paroxysmal nocturnal dyspnea, syncope and tachypnea.    Cardiovascular risk factors include advanced age (older than 9 for men, 25 for women), diabetes mellitus and hypertension.  Husband is in failing health and pt stays worried about his health.   Wt Readings from Last 3 Encounters:  01/29/16 185 lb (83.9 kg)  12/31/15 188 lb (85.3 kg)  11/15/15 185 lb (83.9 kg)   ------------------------------------------------------------------------    Prior to Admission medications   Medication Sig Start Date End Date Taking? Authorizing Provider  amLODipine (NORVASC) 5 MG tablet Take 1 tablet (5 mg total) by mouth daily. 01/13/16   Birdie Sons, MD  aspirin 81 MG tablet Take by mouth.    Historical Provider, MD  cetirizine (ZYRTEC) 10 MG tablet Take 1 tablet (10 mg total) by mouth daily. Patient not taking: Reported on 01/13/2016 08/18/14   Olen Cordial, NP  cloNIDine (CATAPRES) 0.1 MG tablet Take by mouth. 12/16/15 12/15/16  Historical Provider, MD  cyclobenzaprine (FLEXERIL) 10 MG tablet Take 1 tablet (10 mg total) by mouth at bedtime. Patient not taking: Reported on 01/13/2016 05/07/15   Jerrol Banana., MD  DUREZOL 0.05 % EMUL INSTILL 1 DROP INTO LEFT EYE TWICE DAILY AFTER PATCH  IS REMOVED 01/26/15   Historical Provider, MD  fluticasone (FLONASE) 50 MCG/ACT nasal spray Place into the nose. 08/29/13   Historical Provider, MD  gabapentin (NEURONTIN) 100 MG capsule Take 200 mg by mouth at bedtime. Reported on 08/13/2015 08/29/13   Historical Provider, MD  hydrochlorothiazide (HYDRODIURIL) 25 MG tablet TAKE 1 TABLET EVERY DAY 11/07/15   Berneta Sconyers Maceo Pro., MD  LORazepam (ATIVAN) 0.5 MG tablet Take 1 tablet (0.5 mg total) by mouth 2 (two) times daily as needed for anxiety. 11/15/15   Nadav Swindell Maceo Pro., MD  meloxicam (MOBIC) 7.5 MG tablet Take by mouth. 08/29/13   Historical Provider, MD  metFORMIN (GLUCOPHAGE) 500 MG tablet TAKE 1 TABLET DAILY 04/08/15   Jerrol Banana., MD  metoprolol succinate (TOPROL-XL) 50 MG 24 hr tablet TAKE 1 TABLET EVERY DAY 08/08/15   Chrishaun Sasso Maceo Pro., MD  olmesartan (BENICAR) 40 MG tablet Take 1 tablet (40 mg total) by mouth daily. 12/31/15   Emilee Market Maceo Pro., MD  Pitavastatin Calcium (LIVALO) 4 MG TABS Take 1 tablet (4 mg total) by mouth daily. 01/13/16   Birdie Sons, MD  sodium chloride (OCEAN) 0.65 % SOLN nasal spray Place 2 sprays into both nostrils every 2 (two) hours while awake. Patient not taking: Reported on 01/13/2016 08/18/14   Olen Cordial, NP  traZODone (DESYREL) 50 MG tablet Take 1 tablet (50 mg total) by mouth at bedtime as needed for sleep. 12/31/15   Catherine Oak Maceo Pro., MD    Patient Active Problem List   Diagnosis Date Noted  . Angina pectoris ([place]) 07/18/2014  .  Arthritis 07/18/2014  . CAD in native artery 07/18/2014  . Chronic anxiety 07/18/2014  . Diabetes ([place]) 07/18/2014  . BP (high blood pressure) 07/18/2014  . Heart & renal disease, hypertensive, with heart failure ([place]) 07/18/2014  . Insomnia, matutinal 07/18/2014  . Benign neoplasm of colon 05/19/2013  . Acid reflux 05/19/2013  . Hypercholesteremia 05/19/2013  . Neuropathy ([place]) 05/19/2013    Past Medical History:  Diagnosis Date  . Anginal  pain ([place])   . Arthritis   . Coronary artery disease   . Diabetes mellitus without complication ([place])   . Diverticulitis   . Dysrhythmia   . Heart disease   . Hypercholesteremia   . Hypertension   . Palpitations     Social History   Social History  . Marital status: Married    Spouse name: N/A  . Number of children: N/A  . Years of education: N/A   Occupational History  . Not on file.   Social History Main Topics  . Smoking status: Never Smoker  . Smokeless tobacco: Never Used  . Alcohol use No  . Drug use: No  . Sexual activity: Not on file   Other Topics Concern  . Not on file   Social History Narrative  . No narrative on file    Allergies  Allergen Reactions  . Sulfa Antibiotics     Review of Systems  Constitutional: Positive for malaise/fatigue.  HENT: Negative.   Eyes: Negative.   Respiratory: Negative.   Cardiovascular: Positive for palpitations.  Gastrointestinal: Negative.   Genitourinary: Negative.   Musculoskeletal: Negative.   Skin: Negative.   Neurological: Negative.   Endo/Heme/Allergies: Negative.   Psychiatric/Behavioral: Negative.   O2 sat is 98%.  Immunization History  Administered Date(s) Administered  . Influenza, High Dose Seasonal PF 01/31/2015, 12/31/2015  . Pneumococcal Conjugate-13 08/29/2013  . Pneumococcal Polysaccharide-23 01/28/2006    Objective:  BP (!) 164/80 (BP Location: Left Arm, Patient Position: Sitting, Cuff Size: Normal)   Pulse 68   Temp 97.9 F (36.6 C) (Oral)   Resp 16   Wt 185 lb (83.9 kg)   BMI 28.98 kg/m   Physical Exam  Constitutional: She is oriented to person, place, and time and well-developed, well-nourished, and in no distress.  HENT:  Head: Normocephalic and atraumatic.  Right Ear: External ear normal.  Left Ear: External ear normal.  Nose: Nose normal.  Eyes: Conjunctivae are normal.  Neck: Neck supple. No thyromegaly present.  Cardiovascular: Normal rate, regular rhythm, normal heart  sounds and intact distal pulses.   Pulmonary/Chest: Effort normal and breath sounds normal.  Abdominal: Soft.  Neurological: She is alert and oriented to person, place, and time. No cranial nerve deficit. She exhibits normal muscle tone. Gait normal. Coordination normal.  Skin: Skin is warm and dry.  Psychiatric: Mood, memory, affect and judgment normal.    Lab Results  Component Value Date   WBC 8.2 11/14/2015   HGB 14.0 11/14/2015   HCT 42.0 11/14/2015   PLT 221 11/14/2015   GLUCOSE 153 (H) 11/14/2015   CHOL 295 (H) 05/21/2015   TRIG 331 (H) 05/21/2015   HDL 50 05/21/2015   LDLCALC 179 (H) 05/21/2015   TSH 4.550 (H) 05/21/2015   HGBA1C 7.1 11/15/2015    CMP     Component Value Date/Time   NA 141 11/14/2015 2233   NA 144 05/21/2015 0954   NA 141 06/18/2012 0206   K 4.2 11/14/2015 2233   K 3.7 06/18/2012 0206   CL  106 11/14/2015 2233   CL 105 06/18/2012 0206   CO2 28 11/14/2015 2233   CO2 31 06/18/2012 0206   GLUCOSE 153 (H) 11/14/2015 2233   GLUCOSE 109 (H) 06/18/2012 0206   BUN 14 11/14/2015 2233   BUN 16 05/21/2015 0954   BUN 20 (H) 06/18/2012 0206   CREATININE 1.00 11/14/2015 2233   CREATININE 1.13 06/18/2012 0206   CALCIUM 9.6 11/14/2015 2233   CALCIUM 9.6 06/18/2012 0206   PROT 7.1 07/16/2015 2018   PROT 6.4 05/21/2015 0954   PROT 7.4 06/18/2012 0206   ALBUMIN 4.4 07/16/2015 2018   ALBUMIN 4.3 05/21/2015 0954   ALBUMIN 4.0 06/18/2012 0206   AST 25 07/16/2015 2018   AST 20 06/18/2012 0206   ALT 29 07/16/2015 2018   ALT 19 06/18/2012 0206   ALKPHOS 58 07/16/2015 2018   ALKPHOS 74 06/18/2012 0206   BILITOT 0.4 07/16/2015 2018   BILITOT 0.4 05/21/2015 0954   BILITOT 0.4 06/18/2012 0206   GFRNONAA 52 (L) 11/14/2015 2233   GFRNONAA 47 (L) 06/18/2012 0206   GFRAA >60 11/14/2015 2233   GFRAA 54 (L) 06/18/2012 0206    Assessment and Plan :  1. Hyperlipidemia, unspecified hyperlipidemia type   2. Essential hypertension  - EKG 12-Lead  3. Urinary  frequency  - POCT urinalysis dipstick  4. Malignant hypertension/Labile BP Clonidine BID for now--she has been taking 1 at night until now. - Ambulatory referral to Nephrology 5.CAD Risk factors treated. 6.Thoracic Back Pain I think this is muscular--I do not think this is anginal.Pt had recent myoview by Dr Clayborn Bigness. 7.OA 8.AR   I have done the exam and reviewed the above chart and it is accurate to the best of my knowledge. Development worker, community has been used in this note in any air is in the dictation or transcription are unintentional.  [facility] 01/29/2016 2:14 PM

## 2016-01-29 NOTE — Patient Instructions (Signed)
If morning blood pressure systolic number is greater then 120 take 1 tablet of Clonidine medication that morning

## 2016-02-12 ENCOUNTER — Ambulatory Visit: Payer: Medicare Other | Admitting: Family Medicine

## 2016-02-19 DIAGNOSIS — I129 Hypertensive chronic kidney disease with stage 1 through stage 4 chronic kidney disease, or unspecified chronic kidney disease: Secondary | ICD-10-CM | POA: Diagnosis not present

## 2016-02-19 DIAGNOSIS — I10 Essential (primary) hypertension: Secondary | ICD-10-CM | POA: Diagnosis not present

## 2016-02-19 DIAGNOSIS — N183 Chronic kidney disease, stage 3 (moderate): Secondary | ICD-10-CM | POA: Diagnosis not present

## 2016-02-20 ENCOUNTER — Ambulatory Visit: Payer: Medicare Other

## 2016-02-20 DIAGNOSIS — I10 Essential (primary) hypertension: Secondary | ICD-10-CM | POA: Diagnosis not present

## 2016-03-09 ENCOUNTER — Other Ambulatory Visit: Payer: Self-pay | Admitting: Family Medicine

## 2016-03-18 ENCOUNTER — Ambulatory Visit: Payer: Self-pay | Admitting: Family Medicine

## 2016-03-20 ENCOUNTER — Encounter: Payer: Self-pay | Admitting: Family Medicine

## 2016-03-20 ENCOUNTER — Ambulatory Visit (INDEPENDENT_AMBULATORY_CARE_PROVIDER_SITE_OTHER): Payer: Medicare Other | Admitting: Family Medicine

## 2016-03-20 VITALS — BP 160/88 | HR 66 | Temp 98.1°F | Resp 16 | Wt 185.0 lb

## 2016-03-20 DIAGNOSIS — I10 Essential (primary) hypertension: Secondary | ICD-10-CM

## 2016-03-20 DIAGNOSIS — N3281 Overactive bladder: Secondary | ICD-10-CM | POA: Insufficient documentation

## 2016-03-20 MED ORDER — CLONIDINE HCL 0.2 MG PO TABS: 0.2000 mg | ORAL_TABLET | Freq: Three times a day (TID) | ORAL | 3 refills | Status: DC

## 2016-03-20 MED ORDER — TOLTERODINE TARTRATE ER 4 MG PO CP24: 4.0000 mg | ORAL_CAPSULE | Freq: Every day | ORAL | 3 refills | Status: DC

## 2016-03-20 NOTE — Progress Notes (Signed)
Subjective:  HPI  Hypertension, follow-up:  BP Readings from Last 3 Encounters:  03/20/16 (!) 160/88  01/29/16 (!) 164/80  01/13/16 (!) 172/82    She was last seen for hypertension 1 months ago.  BP at that visit was 164/80. Management since that visit includes . Outside blood pressures are ranging from 130's-160's/70-100's.   Pt reports that she is confused about her medications. It seems like amlodipine is making her BP run higher. She saw the Nephrologist and he told her to take amlodipine BID, Metoprolol daily and the clonidine PRN. She is checking her BP multiple times a day. It is highest in the mornings and lower mid day then starts to go back up. Pt is afraid she will have a stroke in her sleep.   ------------------------------------------------------------------------    Prior to Admission medications   Medication Sig Start Date End Date Taking? Authorizing Provider  amLODipine (NORVASC) 5 MG tablet Take 1 tablet (5 mg total) by mouth daily. 01/13/16  Yes Birdie Sons, MD  aspirin 81 MG tablet Take by mouth.   Yes Historical Provider, MD  cloNIDine (CATAPRES) 0.1 MG tablet Take by mouth. 12/16/15 12/15/16 Yes Historical Provider, MD  hydrochlorothiazide (HYDRODIURIL) 25 MG tablet TAKE 1 TABLET EVERY DAY 11/07/15  Yes Richard Maceo Pro., MD  LORazepam (ATIVAN) 0.5 MG tablet Take 1 tablet (0.5 mg total) by mouth 2 (two) times daily as needed for anxiety. 11/15/15  Yes Richard Maceo Pro., MD  metFORMIN (GLUCOPHAGE) 500 MG tablet TAKE 1 TABLET EVERY DAY 03/10/16  Yes Jerrol Banana., MD  metoprolol succinate (TOPROL-XL) 50 MG 24 hr tablet TAKE 1 TABLET EVERY DAY 08/08/15  Yes Richard Maceo Pro., MD  olmesartan (BENICAR) 40 MG tablet Take 1 tablet (40 mg total) by mouth daily. 12/31/15  Yes Richard Maceo Pro., MD  cetirizine (ZYRTEC) 10 MG tablet Take 1 tablet (10 mg total) by mouth daily. Patient not taking: Reported on 01/29/2016 08/18/14   Olen Cordial, NP  cyclobenzaprine (FLEXERIL) 10 MG tablet Take 1 tablet (10 mg total) by mouth at bedtime. Patient not taking: Reported on 01/29/2016 05/07/15   Jerrol Banana., MD  DUREZOL 0.05 % EMUL INSTILL 1 DROP INTO LEFT EYE TWICE DAILY AFTER PATCH IS REMOVED 01/26/15   Historical Provider, MD  fluticasone (FLONASE) 50 MCG/ACT nasal spray Place into the nose. 08/29/13   Historical Provider, MD  gabapentin (NEURONTIN) 100 MG capsule Take 200 mg by mouth at bedtime. Reported on 08/13/2015 08/29/13   Historical Provider, MD  meloxicam (MOBIC) 7.5 MG tablet Take by mouth. 08/29/13   Historical Provider, MD  Pitavastatin Calcium (LIVALO) 4 MG TABS Take 1 tablet (4 mg total) by mouth daily. Patient not taking: Reported on 01/29/2016 01/13/16   Birdie Sons, MD  sodium chloride (OCEAN) 0.65 % SOLN nasal spray Place 2 sprays into both nostrils every 2 (two) hours while awake. Patient not taking: Reported on 01/29/2016 08/18/14   Olen Cordial, NP  traZODone (DESYREL) 50 MG tablet Take 1 tablet (50 mg total) by mouth at bedtime as needed for sleep. Patient not taking: Reported on 01/29/2016 12/31/15   Jerrol Banana., MD    Patient Active Problem List   Diagnosis Date Noted  . Angina pectoris ([place]) 07/18/2014  . Arthritis 07/18/2014  . CAD in native artery 07/18/2014  . Chronic anxiety 07/18/2014  . Diabetes ([place]) 07/18/2014  . BP (high blood pressure) 07/18/2014  .  Heart & renal disease, hypertensive, with heart failure ([place]) 07/18/2014  . Insomnia, matutinal 07/18/2014  . Benign neoplasm of colon 05/19/2013  . Acid reflux 05/19/2013  . Hypercholesteremia 05/19/2013  . Neuropathy ([place]) 05/19/2013    Past Medical History:  Diagnosis Date  . Anginal pain ([place])   . Arthritis   . Coronary artery disease   . Diabetes mellitus without complication ([place])   . Diverticulitis   . Dysrhythmia   . Heart disease   . Hypercholesteremia   . Hypertension   . Palpitations     Social  History   Social History  . Marital status: Married    Spouse name: N/A  . Number of children: N/A  . Years of education: N/A   Occupational History  . Not on file.   Social History Main Topics  . Smoking status: Never Smoker  . Smokeless tobacco: Never Used  . Alcohol use No  . Drug use: No  . Sexual activity: Not on file   Other Topics Concern  . Not on file   Social History Narrative  . No narrative on file    Allergies  Allergen Reactions  . Sulfa Antibiotics     Review of Systems  Constitutional: Negative.   HENT: Negative.   Eyes: Negative.   Respiratory: Negative.   Cardiovascular: Positive for palpitations.  Gastrointestinal: Negative.   Genitourinary: Positive for urgency.       Recent urge incontinence.  Musculoskeletal: Negative.   Skin: Negative.   Neurological: Positive for dizziness and headaches.  Endo/Heme/Allergies: Negative.   Psychiatric/Behavioral: Negative.     Immunization History  Administered Date(s) Administered  . Influenza, High Dose Seasonal PF 01/31/2015, 12/31/2015  . Pneumococcal Conjugate-13 08/29/2013  . Pneumococcal Polysaccharide-23 01/28/2006    Objective:  BP (!) 160/88 (BP Location: Left Arm, Patient Position: Sitting, Cuff Size: Normal)   Pulse 66   Temp 98.1 F (36.7 C) (Oral)   Resp 16   Wt 185 lb (83.9 kg)   BMI 28.98 kg/m   Physical Exam  Constitutional: She is oriented to person, place, and time and well-developed, well-nourished, and in no distress.  HENT:  Head: Normocephalic and atraumatic.  Right Ear: External ear normal.  Left Ear: External ear normal.  Nose: Nose normal.  Mouth/Throat: Oropharynx is clear and moist.  Eyes: Conjunctivae are normal. No scleral icterus.  Neck: No thyromegaly present.  Cardiovascular: Normal rate, regular rhythm and normal heart sounds.   Pulmonary/Chest: Effort normal and breath sounds normal.  Abdominal: Soft.  Neurological: She is alert and oriented to person,  place, and time. Gait normal. GCS score is 15.  Skin: Skin is warm and dry.  Psychiatric: Mood, memory, affect and judgment normal.    Lab Results  Component Value Date   WBC 8.2 11/14/2015   HGB 14.0 11/14/2015   HCT 42.0 11/14/2015   PLT 221 11/14/2015   GLUCOSE 153 (H) 11/14/2015   CHOL 295 (H) 05/21/2015   TRIG 331 (H) 05/21/2015   HDL 50 05/21/2015   LDLCALC 179 (H) 05/21/2015   TSH 4.550 (H) 05/21/2015   HGBA1C 7.1 11/15/2015    CMP     Component Value Date/Time   NA 141 11/14/2015 2233   NA 144 05/21/2015 0954   NA 141 06/18/2012 0206   K 4.2 11/14/2015 2233   K 3.7 06/18/2012 0206   CL 106 11/14/2015 2233   CL 105 06/18/2012 0206   CO2 28 11/14/2015 2233   CO2 31 06/18/2012 0206  GLUCOSE 153 (H) 11/14/2015 2233   GLUCOSE 109 (H) 06/18/2012 0206   BUN 14 11/14/2015 2233   BUN 16 05/21/2015 0954   BUN 20 (H) 06/18/2012 0206   CREATININE 1.00 11/14/2015 2233   CREATININE 1.13 06/18/2012 0206   CALCIUM 9.6 11/14/2015 2233   CALCIUM 9.6 06/18/2012 0206   PROT 7.1 07/16/2015 2018   PROT 6.4 05/21/2015 0954   PROT 7.4 06/18/2012 0206   ALBUMIN 4.4 07/16/2015 2018   ALBUMIN 4.3 05/21/2015 0954   ALBUMIN 4.0 06/18/2012 0206   AST 25 07/16/2015 2018   AST 20 06/18/2012 0206   ALT 29 07/16/2015 2018   ALT 19 06/18/2012 0206   ALKPHOS 58 07/16/2015 2018   ALKPHOS 74 06/18/2012 0206   BILITOT 0.4 07/16/2015 2018   BILITOT 0.4 05/21/2015 0954   BILITOT 0.4 06/18/2012 0206   GFRNONAA 52 (L) 11/14/2015 2233   GFRNONAA 47 (L) 06/18/2012 0206   GFRAA >60 11/14/2015 2233   GFRAA 54 (L) 06/18/2012 0206    Assessment and Plan :  1. Essential hypertension  - cloNIDine (CATAPRES) 0.2 MG tablet; Take 1 tablet (0.2 mg total) by mouth 3 (three) times daily.  Dispense: 90 tablet; Refill: 3  2. OAB (overactive bladder)  - tolterodine (DETROL LA) 4 MG 24 hr capsule; Take 1 capsule (4 mg total) by mouth daily.  Dispense: 30 capsule; Refill: 3 3. Chronic anxiety I  have done the exam and reviewed the above chart and it is accurate to the best of my knowledge. Development worker, community has been used in this note in any air is in the dictation or transcription are unintentional.  [facility] 03/20/2016 10:05 AM

## 2016-03-24 ENCOUNTER — Ambulatory Visit: Payer: Self-pay | Admitting: Family Medicine

## 2016-04-09 ENCOUNTER — Ambulatory Visit (INDEPENDENT_AMBULATORY_CARE_PROVIDER_SITE_OTHER): Payer: Medicare Other | Admitting: Family Medicine

## 2016-04-09 VITALS — BP 114/64 | HR 68 | Temp 97.9°F | Resp 14 | Wt 182.0 lb

## 2016-04-09 DIAGNOSIS — E119 Type 2 diabetes mellitus without complications: Secondary | ICD-10-CM

## 2016-04-09 DIAGNOSIS — Z789 Other specified health status: Secondary | ICD-10-CM | POA: Diagnosis not present

## 2016-04-09 DIAGNOSIS — R946 Abnormal results of thyroid function studies: Secondary | ICD-10-CM | POA: Diagnosis not present

## 2016-04-09 DIAGNOSIS — R7989 Other specified abnormal findings of blood chemistry: Secondary | ICD-10-CM

## 2016-04-09 DIAGNOSIS — T23161A Burn of first degree of back of right hand, initial encounter: Secondary | ICD-10-CM

## 2016-04-09 DIAGNOSIS — F5109 Other insomnia not due to a substance or known physiological condition: Secondary | ICD-10-CM | POA: Diagnosis not present

## 2016-04-09 DIAGNOSIS — Z23 Encounter for immunization: Secondary | ICD-10-CM | POA: Diagnosis not present

## 2016-04-09 DIAGNOSIS — E785 Hyperlipidemia, unspecified: Secondary | ICD-10-CM | POA: Diagnosis not present

## 2016-04-09 DIAGNOSIS — N3281 Overactive bladder: Secondary | ICD-10-CM

## 2016-04-09 DIAGNOSIS — I10 Essential (primary) hypertension: Secondary | ICD-10-CM

## 2016-04-09 LAB — POCT GLYCOSYLATED HEMOGLOBIN (HGB A1C): Hemoglobin A1C: 7.4

## 2016-04-09 MED ORDER — AMLODIPINE BESYLATE 10 MG PO TABS
10.0000 mg | ORAL_TABLET | Freq: Every day | ORAL | 3 refills | Status: DC
Start: 2016-04-09 — End: 2016-04-13

## 2016-04-09 NOTE — Progress Notes (Signed)
Valerie Foster  MRN: [id number] DOB: Jun 22, 1933  Subjective:  HPI  Patient is here for follow up. Last office visit was on 03/20/16. Patient was started on Detrol at that time and she states symptoms are improved but feels like this improved also since her b/p readings have been better. She is still taking the new medication. She is checking her sugar and readings have been around 140s-150s. No hypoglycemic episodes. Lab Results  Component Value Date   HGBA1C 7.1 11/15/2015   She is due for all labs and TSH repeat due to abnormal level on last check in March 2017. She has been checking her b/p usually when she wakes up in the morning and in the evening before bedtime. She took Clonidine 0.1 mg 1 tablet in the past 10 days when her reading was 170/100. Other then that 1 time her readings have been 132/70 and lower like 118/73, 116/65. She does get lightheaded at times and feels extra sleepy with some of the lower readings she is getting. She wanted to discuss changing Amlodipine to 10 mg so she does not have to worry about taking second tablet in the evening and forgetting it.  Also patient wanted to know if she still needs to be going to Dr Candiss Norse, we referred her there previously when b/p kept been elevated. BP Readings from Last 3 Encounters:  04/09/16 114/64  03/20/16 (!) 160/88  01/29/16 (!) 164/80   Patient Active Problem List   Diagnosis Date Noted  . OAB (overactive bladder) 03/20/2016  . Angina pectoris ([place]) 07/18/2014  . Arthritis 07/18/2014  . CAD in native artery 07/18/2014  . Chronic anxiety 07/18/2014  . Diabetes ([place]) 07/18/2014  . BP (high blood pressure) 07/18/2014  . Heart & renal disease, hypertensive, with heart failure ([place]) 07/18/2014  . Insomnia, matutinal 07/18/2014  . Benign neoplasm of colon 05/19/2013  . Acid reflux 05/19/2013  . Hypercholesteremia 05/19/2013  . Neuropathy ([place]) 05/19/2013    Past Medical History:  Diagnosis Date  . Anginal pain ([place])    . Arthritis   . Coronary artery disease   . Diabetes mellitus without complication ([place])   . Diverticulitis   . Dysrhythmia   . Heart disease   . Hypercholesteremia   . Hypertension   . Palpitations     Social History   Social History  . Marital status: Married    Spouse name: N/A  . Number of children: N/A  . Years of education: N/A   Occupational History  . Not on file.   Social History Main Topics  . Smoking status: Never Smoker  . Smokeless tobacco: Never Used  . Alcohol use No  . Drug use: No  . Sexual activity: Not on file   Other Topics Concern  . Not on file   Social History Narrative  . No narrative on file    Outpatient Encounter Prescriptions as of 04/09/2016  Medication Sig Note  . amLODipine (NORVASC) 5 MG tablet Take 1 tablet (5 mg total) by mouth daily. (Patient taking differently: Take 5 mg by mouth 2 (two) times daily. )   . aspirin 81 MG tablet Take by mouth. 07/18/2014: Received from: [facility]  . DUREZOL 0.05 % EMUL INSTILL 1 DROP INTO LEFT EYE TWICE DAILY AFTER PATCH IS REMOVED 01/31/2015: Received from: External Pharmacy  . fluticasone (FLONASE) 50 MCG/ACT nasal spray Place into the nose. 07/18/2014: Received from: [facility]  . gabapentin (NEURONTIN) 100 MG capsule Take 200 mg by  mouth at bedtime. Reported on 08/13/2015 07/18/2014: Received from: [facility]  . hydrochlorothiazide (HYDRODIURIL) 25 MG tablet TAKE 1 TABLET EVERY DAY   . LORazepam (ATIVAN) 0.5 MG tablet Take 1 tablet (0.5 mg total) by mouth 2 (two) times daily as needed for anxiety.   . meloxicam (MOBIC) 7.5 MG tablet Take by mouth. 07/18/2014: Received from: [facility]  . metFORMIN (GLUCOPHAGE) 500 MG tablet TAKE 1 TABLET EVERY DAY   . metoprolol succinate (TOPROL-XL) 50 MG 24 hr tablet TAKE 1 TABLET EVERY DAY   . olmesartan (BENICAR) 40 MG tablet Take 1 tablet (40 mg total) by mouth daily.   Marland Kitchen tolterodine  (DETROL LA) 4 MG 24 hr capsule Take 1 capsule (4 mg total) by mouth daily.   . [DISCONTINUED] cloNIDine (CATAPRES) 0.2 MG tablet Take 1 tablet (0.2 mg total) by mouth 3 (three) times daily.   . cetirizine (ZYRTEC) 10 MG tablet Take 1 tablet (10 mg total) by mouth daily. (Patient not taking: Reported on 01/29/2016) 04/09/2016: prn  . cloNIDine (CATAPRES) 0.1 MG tablet    . cyclobenzaprine (FLEXERIL) 10 MG tablet Take 1 tablet (10 mg total) by mouth at bedtime. (Patient not taking: Reported on 01/29/2016) 04/09/2016: prn  . sodium chloride (OCEAN) 0.65 % SOLN nasal spray Place 2 sprays into both nostrils every 2 (two) hours while awake. (Patient not taking: Reported on 01/29/2016) 04/09/2016: prn  . [DISCONTINUED] Pitavastatin Calcium (LIVALO) 4 MG TABS Take 1 tablet (4 mg total) by mouth daily. (Patient not taking: Reported on 01/29/2016)   . [DISCONTINUED] traZODone (DESYREL) 50 MG tablet Take 1 tablet (50 mg total) by mouth at bedtime as needed for sleep. (Patient not taking: Reported on 01/29/2016)    No facility-administered encounter medications on file as of 04/09/2016.     Allergies  Allergen Reactions  . Sulfa Antibiotics     Review of Systems  Constitutional: Positive for malaise/fatigue.  Respiratory: Negative.   Cardiovascular: Negative.   Gastrointestinal: Negative.   Genitourinary: Positive for frequency and urgency.       Better.  Musculoskeletal: Negative.   Neurological: Positive for dizziness.  Psychiatric/Behavioral: Negative.        Stable for now    Objective:  BP 114/64   Pulse 68   Temp 97.9 F (36.6 C)   Resp 14   Wt 182 lb (82.6 kg)   BMI 28.51 kg/m   Physical Exam  Constitutional: She is oriented to person, place, and time and well-developed, well-nourished, and in no distress.  HENT:  Head: Normocephalic and atraumatic.  Right Ear: External ear normal.  Left Ear: External ear normal.  Eyes: Conjunctivae are normal. Pupils are equal, round, and reactive to  light.  Neck: Normal range of motion. Neck supple.  Cardiovascular: Normal rate, regular rhythm, normal heart sounds and intact distal pulses.   No murmur heard. Pulmonary/Chest: Effort normal and breath sounds normal. No respiratory distress. She has no wheezes.  Musculoskeletal: She exhibits no edema or tenderness.  Neurological: She is alert and oriented to person, place, and time.  Skin:  Burn on top of right hand  Psychiatric: Mood, memory, affect and judgment normal.   Diabetic Foot Exam - Simple   Simple Foot Form Diabetic Foot exam was performed with the following findings:  Yes 04/09/2016  8:58 AM  Visual Inspection No deformities, no ulcerations, no other skin breakdown bilaterally:  Yes Sensation Testing Intact to touch and monofilament testing bilaterally:  Yes Pulse Check Posterior Tibialis and  Dorsalis pulse intact bilaterally:  Yes Comments      Assessment and Plan :  1. Essential hypertension Much better. RX refill for Amlodipine sent in for 10 mg to make it more convenient for patient. Follow. Can cancel appointment with Dr Candiss Norse since b/p is controlled for now. Advised patient to take Clonidine 0.1 mg as needed. - CBC w/Diff/Platelet - Comprehensive metabolic panel  2. OAB (overactive bladder) Better.  3. Hyperlipidemia, unspecified hyperlipidemia type Re check labs. Patient is statin intolerant. Will try Zetia again since it is cheaper if levels are not improved. Pending results. - Comprehensive metabolic panel - Lipid Panel With LDL/HDL Ratio  4. Insomnia, matutinal Stable on Advil pm use as needed. Not taking Trazodone at this time.  5. Type 2 diabetes mellitus without complication, without long-term current use of insulin (HCC) A1C 7.4. Slightly worse. Will follow and re check on the next visit. - POCT HgB A1C  6. Statin intolerance 7. Malignant hypertension Refill given. - amLODipine (NORVASC) 10 MG tablet; Take 1 tablet (10 mg total) by mouth  daily.  Dispense: 90 tablet; Refill: 3  8. Need for immunization against tetanus alone Td administered today.  9. Superficial burn of back of right hand, initial encounter Td administered today. No record of her last tetanus immunization.  10. Abnormal thyroid stimulating hormone (TSH) level On last check. She is not on any medications for this, re check today, pending results. - TSH  HPI, Exam and A&P transcribed under direction and in the presence of Miguel Aschoff, MD. I have done the exam and reviewed the chart and it is accurate to the best of my knowledge. Development worker, community has been used and  any errors in dictation or transcription are unintentional. Miguel Aschoff M.D. [facility]

## 2016-04-10 LAB — CBC WITH DIFFERENTIAL/PLATELET
Basophils Absolute: 0.1 10*3/uL (ref 0.0–0.2)
Basos: 1 %
EOS (ABSOLUTE): 0.2 10*3/uL (ref 0.0–0.4)
EOS: 4 %
HEMATOCRIT: 39.8 % (ref 34.0–46.6)
HEMOGLOBIN: 12.9 g/dL (ref 11.1–15.9)
IMMATURE GRANS (ABS): 0 10*3/uL (ref 0.0–0.1)
IMMATURE GRANULOCYTES: 0 %
LYMPHS ABS: 1.1 10*3/uL (ref 0.7–3.1)
Lymphs: 15 %
MCH: 27.3 pg (ref 26.6–33.0)
MCHC: 32.4 g/dL (ref 31.5–35.7)
MCV: 84 fL (ref 79–97)
MONOCYTES: 8 %
Monocytes Absolute: 0.5 10*3/uL (ref 0.1–0.9)
Neutrophils Absolute: 5.1 10*3/uL (ref 1.4–7.0)
Neutrophils: 72 %
Platelets: 284 10*3/uL (ref 150–379)
RBC: 4.72 x10E6/uL (ref 3.77–5.28)
RDW: 14.8 % (ref 12.3–15.4)
WBC: 6.9 10*3/uL (ref 3.4–10.8)

## 2016-04-10 LAB — COMPREHENSIVE METABOLIC PANEL
ALBUMIN: 4.3 g/dL (ref 3.5–4.7)
ALT: 14 IU/L (ref 0–32)
AST: 13 IU/L (ref 0–40)
Albumin/Globulin Ratio: 1.7 (ref 1.2–2.2)
Alkaline Phosphatase: 86 IU/L (ref 39–117)
BUN / CREAT RATIO: 15 (ref 12–28)
BUN: 15 mg/dL (ref 8–27)
Bilirubin Total: 0.3 mg/dL (ref 0.0–1.2)
CALCIUM: 9.9 mg/dL (ref 8.7–10.3)
CO2: 24 mmol/L (ref 18–29)
CREATININE: 1.02 mg/dL — AB (ref 0.57–1.00)
Chloride: 99 mmol/L (ref 96–106)
GFR, EST AFRICAN AMERICAN: 60 mL/min/{1.73_m2} (ref >59)
GFR, EST NON AFRICAN AMERICAN: 52 mL/min/{1.73_m2} — AB (ref >59)
Globulin, Total: 2.5 g/dL (ref 1.5–4.5)
Glucose: 140 mg/dL — ABNORMAL HIGH (ref 65–99)
Potassium: 4.3 mmol/L (ref 3.5–5.2)
SODIUM: 140 mmol/L (ref 134–144)
TOTAL PROTEIN: 6.8 g/dL (ref 6.0–8.5)

## 2016-04-10 LAB — LIPID PANEL WITH LDL/HDL RATIO
Cholesterol, Total: 253 mg/dL — ABNORMAL HIGH (ref 100–199)
HDL: 46 mg/dL (ref >39)
LDL CALC: 154 mg/dL — AB (ref 0–99)
LDL/HDL RATIO: 3.3 ratio — AB (ref 0.0–3.2)
Triglycerides: 266 mg/dL — ABNORMAL HIGH (ref 0–149)
VLDL CHOLESTEROL CAL: 53 mg/dL — AB (ref 5–40)

## 2016-04-10 LAB — TSH: TSH: 3.42 u[IU]/mL (ref 0.450–4.500)

## 2016-04-13 ENCOUNTER — Ambulatory Visit (INDEPENDENT_AMBULATORY_CARE_PROVIDER_SITE_OTHER): Payer: Medicare Other | Admitting: Physician Assistant

## 2016-04-13 ENCOUNTER — Telehealth: Payer: Self-pay | Admitting: Emergency Medicine

## 2016-04-13 ENCOUNTER — Encounter: Payer: Self-pay | Admitting: Physician Assistant

## 2016-04-13 VITALS — BP 138/68 | HR 80 | Temp 97.6°F | Resp 16 | Wt 182.6 lb

## 2016-04-13 DIAGNOSIS — I10 Essential (primary) hypertension: Secondary | ICD-10-CM | POA: Diagnosis not present

## 2016-04-13 DIAGNOSIS — R109 Unspecified abdominal pain: Secondary | ICD-10-CM | POA: Diagnosis not present

## 2016-04-13 DIAGNOSIS — R350 Frequency of micturition: Secondary | ICD-10-CM | POA: Diagnosis not present

## 2016-04-13 LAB — POCT URINALYSIS DIPSTICK
Bilirubin, UA: NEGATIVE
Blood, UA: NEGATIVE
Glucose, UA: NEGATIVE
Ketones, UA: NEGATIVE
Leukocytes, UA: NEGATIVE
Nitrite, UA: NEGATIVE
Protein, UA: NEGATIVE
Spec Grav, UA: 1.01
Urobilinogen, UA: 0.2
pH, UA: 6

## 2016-04-13 MED ORDER — DILTIAZEM HCL ER 120 MG PO CP24: 120.0000 mg | ORAL_CAPSULE | Freq: Every day | ORAL | 0 refills | Status: DC

## 2016-04-13 NOTE — Telephone Encounter (Signed)
LMTCB

## 2016-04-13 NOTE — Progress Notes (Signed)
Patient: Valerie Foster Female    DOB: Oct 29, 1933   83 y.o.   MRN: [id number] Visit Date: 04/13/2016  Today's Provider: Mar Daring, PA-C   Chief Complaint  Patient presents with  . Hypertension  . Urinary Tract Infection   Subjective:    HPI Patient is here today with c/o right side pain, lower side. She reports she is going to the bathroom more frequently. No painful urination or hematuria.   Blood Pressure: Patient is having trouble with her blood pressure. She feel that it goes up and down. She reports that yesterday around 4 pm it was 97/60. Then she was feeling weak, neck pain, having blurred vision, tired,and check her BP around 8 pm and it was 168/90. She reports she knows when her blood pressure is not "good" because she gets palpitations and neck pain. She is currently taking Amlodipine 10 mg.   Attacks started when she started the amlodipine. She also has had a daily headache since starting the amlodipine. Patient reports she has not been taking the detrol since starting the amlodipine because she was scared to take it since she was having such issues.     Allergies  Allergen Reactions  . Statins     Joint and muscle pain severe  . Sulfa Antibiotics      Current Outpatient Prescriptions:  .  amLODipine (NORVASC) 10 MG tablet, Take 1 tablet (10 mg total) by mouth daily., Disp: 90 tablet, Rfl: 3 .  aspirin 81 MG tablet, Take by mouth., Disp: , Rfl:  .  cloNIDine (CATAPRES) 0.1 MG tablet, , Disp: , Rfl:  .  DUREZOL 0.05 % EMUL, INSTILL 1 DROP INTO LEFT EYE TWICE DAILY AFTER PATCH IS REMOVED, Disp: , Rfl: 0 .  fluticasone (FLONASE) 50 MCG/ACT nasal spray, Place into the nose., Disp: , Rfl:  .  hydrochlorothiazide (HYDRODIURIL) 25 MG tablet, TAKE 1 TABLET EVERY DAY, Disp: 90 tablet, Rfl: 3 .  LORazepam (ATIVAN) 0.5 MG tablet, Take 1 tablet (0.5 mg total) by mouth 2 (two) times daily as needed for anxiety., Disp: 60 tablet, Rfl: 5 .  meloxicam (MOBIC) 7.5 MG  tablet, Take by mouth., Disp: , Rfl:  .  metFORMIN (GLUCOPHAGE) 500 MG tablet, TAKE 1 TABLET EVERY DAY, Disp: 90 tablet, Rfl: 3 .  metoprolol succinate (TOPROL-XL) 50 MG 24 hr tablet, TAKE 1 TABLET EVERY DAY, Disp: 90 tablet, Rfl: 3 .  olmesartan (BENICAR) 40 MG tablet, Take 1 tablet (40 mg total) by mouth daily., Disp: 30 tablet, Rfl: 12 .  tolterodine (DETROL LA) 4 MG 24 hr capsule, Take 1 capsule (4 mg total) by mouth daily., Disp: 30 capsule, Rfl: 3 .  cetirizine (ZYRTEC) 10 MG tablet, Take 1 tablet (10 mg total) by mouth daily. (Patient not taking: Reported on 01/29/2016), Disp: , Rfl:  .  cyclobenzaprine (FLEXERIL) 10 MG tablet, Take 1 tablet (10 mg total) by mouth at bedtime. (Patient not taking: Reported on 01/29/2016), Disp: 30 tablet, Rfl: 5 .  gabapentin (NEURONTIN) 100 MG capsule, Take 200 mg by mouth at bedtime. Reported on 08/13/2015, Disp: , Rfl:  .  sodium chloride (OCEAN) 0.65 % SOLN nasal spray, Place 2 sprays into both nostrils every 2 (two) hours while awake. (Patient not taking: Reported on 01/29/2016), Disp: , Rfl: 0  Review of Systems  Constitutional: Positive for fatigue.  Eyes: Positive for visual disturbance.  Cardiovascular: Positive for palpitations. Negative for chest pain and leg swelling.  Genitourinary: Positive for  flank pain and frequency. Negative for dysuria and hematuria.  Musculoskeletal: Positive for neck pain.  Neurological: Positive for headaches. Negative for dizziness.  Psychiatric/Behavioral: The patient is nervous/anxious.     Social History  Substance Use Topics  . Smoking status: Never Smoker  . Smokeless tobacco: Never Used  . Alcohol use No   Objective:   BP 138/68 (BP Location: Left Arm, Patient Position: Sitting, Cuff Size: Large) Comment: on pt's machine 143/80 P:71  Pulse 80   Temp 97.6 F (36.4 C) (Oral)   Resp 16   Wt 182 lb 9.6 oz (82.8 kg)   BMI 28.60 kg/m   Physical Exam  Constitutional: She appears well-developed and  well-nourished. No distress.  Neck: Normal range of motion. Neck supple. No JVD present. No tracheal deviation present. No thyromegaly present.  Cardiovascular: Normal rate, regular rhythm and normal heart sounds.  Exam reveals no gallop and no friction rub.   No murmur heard. Pulmonary/Chest: Effort normal and breath sounds normal. No respiratory distress. She has no wheezes. She has no rales.  Musculoskeletal: She exhibits no edema.  Lymphadenopathy:    She has no cervical adenopathy.  Skin: She is not diaphoretic.  Vitals reviewed.      Assessment & Plan:     1. Essential hypertension Since patient thinks all symptoms started when she started amlodipine, I will change her to diltiazem as below. She is to continue all other blood pressure medications. Advised also for patient to use lorazepam when she feels these episodes starting as I do suspect there is an anxiety component associated. She is to follow up with Dr. Rosanna Randy to have her blood pressure checked in 2 weeks. - diltiazem (DILACOR XR) 120 MG 24 hr capsule; Take 1 capsule (120 mg total) by mouth daily.  Dispense: 30 capsule; Refill: 0  2. Right flank pain UA was negative today in the office. Suspect anxiety causing increased frequency since her frequency does not start until she starts having the "attacks" in the evenings.  - POCT Urinalysis Dipstick  3. Frequency of urination See above medical treatment plan. - POCT Urinalysis Dipstick       Mar Daring, PA-C  [facility]

## 2016-04-13 NOTE — Patient Instructions (Signed)
Diltiazem extended-release capsules or tablets What is this medicine? DILTIAZEM (dil TYE a zem) is a calcium-channel blocker. It affects the amount of calcium found in your heart and muscle cells. This relaxes your blood vessels, which can reduce the amount of work the heart has to do. This medicine is used to treat high blood pressure and chest pain caused by angina. This medicine may be used for other purposes; ask your health care provider or pharmacist if you have questions. COMMON BRAND NAME(S): Cardizem CD, Cardizem LA, Cardizem SR, Cartia XT, Dilacor XR, Dilt-CD, Diltia XT, Diltzac, Matzim LA, Rema Fendt, Tiamate, Tiazac What should I tell my health care provider before I take this medicine? They need to know if you have any of these conditions: -heart problems, low blood pressure, irregular heartbeat -liver disease -previous heart attack -an unusual or allergic reaction to diltiazem, other medicines, foods, dyes, or preservatives -pregnant or trying to get pregnant -breast-feeding How should I use this medicine? Take this medicine by mouth with a glass of water. Follow the directions on the prescription label. Swallow whole, do not crush or chew. Ask your doctor or pharmacist if your should take this medicine with food. Take your doses at regular intervals. Do not take your medicine more often then directed. Do not stop taking except on the advice of your doctor or health care professional. Ask your doctor or health care professional how to gradually reduce the dose. Talk to your pediatrician regarding the use of this medicine in children. Special care may be needed. Overdosage: If you think you have taken too much of this medicine contact a poison control center or emergency room at once. NOTE: This medicine is only for you. Do not share this medicine with others. What if I miss a dose? If you miss a dose, take it as soon as you can. If it is almost time for your next dose, take only that  dose. Do not take double or extra doses. What may interact with this medicine? Do not take this medicine with any of the following medications: -cisapride -hawthorn -pimozide -ranolazine -red yeast rice This medicine may also interact with the following medications: -buspirone -carbamazepine -cimetidine -cyclosporine -digoxin -local anesthetics or general anesthetics -lovastatin -medicines for anxiety or difficulty sleeping like midazolam and triazolam -medicines for high blood pressure or heart problems -quinidine -rifampin, rifabutin, or rifapentine This list may not describe all possible interactions. Give your health care provider a list of all the medicines, herbs, non-prescription drugs, or dietary supplements you use. Also tell them if you smoke, drink alcohol, or use illegal drugs. Some items may interact with your medicine. What should I watch for while using this medicine? Check your blood pressure and pulse rate regularly. Ask your doctor or health care professional what your blood pressure and pulse rate should be and when you should contact him or her. You may feel dizzy or lightheaded. Do not drive, use machinery, or do anything that needs mental alertness until you know how this medicine affects you. To reduce the risk of dizzy or fainting spells, do not sit or stand up quickly, especially if you are an older patient. Alcohol can make you more dizzy or increase flushing and rapid heartbeats. Avoid alcoholic drinks. What side effects may I notice from receiving this medicine? Side effects that you should report to your doctor or health care professional as soon as possible: -allergic reactions like skin rash, itching or hives, swelling of the face, lips, or tongue -confusion,  mental depression -feeling faint or lightheaded, falls -redness, blistering, peeling or loosening of the skin, including inside the mouth -slow, irregular heartbeat -swelling of the feet and  ankles -unusual bleeding or bruising, pinpoint red spots on the skin Side effects that usually do not require medical attention (report to your doctor or health care professional if they continue or are bothersome): -constipation or diarrhea -difficulty sleeping -facial flushing -headache -nausea, vomiting -sexual dysfunction -weak or tired This list may not describe all possible side effects. Call your doctor for medical advice about side effects. You may report side effects to FDA at 1-800-FDA-1088. Where should I keep my medicine? Keep out of the reach of children. Store at room temperature between 15 and 30 degrees C (59 and 86 degrees F). Protect from humidity. Throw away any unused medicine after the expiration date. NOTE: This sheet is a summary. It may not cover all possible information. If you have questions about this medicine, talk to your doctor, pharmacist, or health care provider.  2017 Elsevier/Gold Standard (2007-06-02 14:35:47)

## 2016-04-13 NOTE — Telephone Encounter (Signed)
Pt informed and voiced understanding of results. 

## 2016-04-13 NOTE — Telephone Encounter (Signed)
-----   Message from Jerrol Banana., MD sent at 04/13/2016  9:39 AM EST ----- Lipids improved slightly.

## 2016-04-28 ENCOUNTER — Ambulatory Visit: Payer: Medicare Other | Admitting: Family Medicine

## 2016-04-28 ENCOUNTER — Ambulatory Visit: Payer: Medicare Other

## 2016-05-10 ENCOUNTER — Other Ambulatory Visit: Payer: Self-pay | Admitting: Physician Assistant

## 2016-05-10 DIAGNOSIS — I10 Essential (primary) hypertension: Secondary | ICD-10-CM

## 2016-05-11 NOTE — Telephone Encounter (Signed)
Last ov 04/13/16  Last filled 04/13/16 Please review. Thank you. sd

## 2016-05-11 NOTE — Telephone Encounter (Signed)
Dr. Rosanna Randy,  I had started her on Diltiazem from amlodipine when you were out of the office. I don't see that she has followed up with you for a BP recheck. I am sending reefill request to you to make sure this is ok.   Thanks, Fleet Contras

## 2016-05-12 ENCOUNTER — Ambulatory Visit (INDEPENDENT_AMBULATORY_CARE_PROVIDER_SITE_OTHER): Payer: Medicare Other | Admitting: Family Medicine

## 2016-05-12 VITALS — BP 100/58 | HR 64 | Temp 97.6°F | Resp 16 | Wt 181.0 lb

## 2016-05-12 DIAGNOSIS — I10 Essential (primary) hypertension: Secondary | ICD-10-CM

## 2016-05-12 NOTE — Progress Notes (Signed)
Valerie Foster  MRN: [id number] DOB: Apr 07, 1933  Subjective:  HPI   The patient is an 83 year old female who presents for follow up of her hypertension.  She was last seen on 04/13/16 by Lillia Abed, PA-C.  At that time she was switched from Amlodipine to Diltiazem and instructed to follow up today.  During that visit she had complained of neck pain, headache and being weak on the Amlodipine.  She also had at that time flank pain with negative UA and A1C was 7.4.  The patient states that since being on the new medication she no longer has any neck pain, weakness, swelling in her ankles or headaches. The dizziness and palpitations are improving but not totally gone.  Her blood pressure readings are running 125-140 over 75-82 in the morning.  In the evenings they running 135-145 over 80's.  Then right before bed she reports they are running 159-169 over 90's.  She is currently taking the following blood pressure medications  8 AM-Diltiazem 120 mg and HCTZ 25 mg.   8 PM she takes Benicar 40 mg and Metoprolol 50 mg. 10-11 PM Clonidine 0.2 mg  Patient would like to know if she should be taking some of the medications at different times in order to get better readings in the evening.   Patient Active Problem List   Diagnosis Date Noted  . OAB (overactive bladder) 03/20/2016  . Angina pectoris ([place]) 07/18/2014  . Arthritis 07/18/2014  . CAD in native artery 07/18/2014  . Chronic anxiety 07/18/2014  . Diabetes ([place]) 07/18/2014  . BP (high blood pressure) 07/18/2014  . Heart & renal disease, hypertensive, with heart failure ([place]) 07/18/2014  . Insomnia, matutinal 07/18/2014  . Benign neoplasm of colon 05/19/2013  . Acid reflux 05/19/2013  . Hypercholesteremia 05/19/2013  . Neuropathy ([place]) 05/19/2013    Past Medical History:  Diagnosis Date  . Anginal pain ([place])   . Arthritis   . Coronary artery disease   . Diabetes mellitus without complication ([place])   . Diverticulitis   .  Dysrhythmia   . Heart disease   . Hypercholesteremia   . Hypertension   . Palpitations     Social History   Social History  . Marital status: Married    Spouse name: N/A  . Number of children: N/A  . Years of education: N/A   Occupational History  . Not on file.   Social History Main Topics  . Smoking status: Never Smoker  . Smokeless tobacco: Never Used  . Alcohol use No  . Drug use: No  . Sexual activity: Not on file   Other Topics Concern  . Not on file   Social History Narrative  . No narrative on file    Outpatient Encounter Prescriptions as of 05/12/2016  Medication Sig Note  . aspirin 81 MG tablet Take by mouth. 07/18/2014: Received from: [facility]  . cetirizine (ZYRTEC) 10 MG tablet Take 1 tablet (10 mg total) by mouth daily. 04/09/2016: prn  . cloNIDine (CATAPRES) 0.1 MG tablet    . cyclobenzaprine (FLEXERIL) 10 MG tablet Take 1 tablet (10 mg total) by mouth at bedtime. 04/09/2016: prn  . diltiazem (DILACOR XR) 120 MG 24 hr capsule TAKE 1 CAPSULE (120 MG TOTAL) BY MOUTH DAILY.   . DUREZOL 0.05 % EMUL INSTILL 1 DROP INTO LEFT EYE TWICE DAILY AFTER PATCH IS REMOVED 01/31/2015: Received from: External Pharmacy  . fluticasone (FLONASE) 50 MCG/ACT nasal spray Place into the nose. 07/18/2014:  Received from: [facility]  . gabapentin (NEURONTIN) 100 MG capsule Take 200 mg by mouth at bedtime. Reported on 08/13/2015 07/18/2014: Received from: [facility]  . hydrochlorothiazide (HYDRODIURIL) 25 MG tablet TAKE 1 TABLET EVERY DAY   . LORazepam (ATIVAN) 0.5 MG tablet Take 1 tablet (0.5 mg total) by mouth 2 (two) times daily as needed for anxiety.   . meloxicam (MOBIC) 7.5 MG tablet Take by mouth. 07/18/2014: Received from: [facility]  . metFORMIN (GLUCOPHAGE) 500 MG tablet TAKE 1 TABLET EVERY DAY   . metoprolol succinate (TOPROL-XL) 50 MG 24 hr tablet TAKE 1 TABLET EVERY DAY   . olmesartan (BENICAR) 40 MG  tablet Take 1 tablet (40 mg total) by mouth daily.   . sodium chloride (OCEAN) 0.65 % SOLN nasal spray Place 2 sprays into both nostrils every 2 (two) hours while awake. 04/09/2016: prn  . tolterodine (DETROL LA) 4 MG 24 hr capsule Take 1 capsule (4 mg total) by mouth daily.    No facility-administered encounter medications on file as of 05/12/2016.     Allergies  Allergen Reactions  . Statins     Joint and muscle pain severe  . Sulfa Antibiotics     Review of Systems  Constitutional: Negative for chills, fever and malaise/fatigue.  HENT: Negative.   Eyes: Negative.   Respiratory: Negative for cough, shortness of breath and wheezing.   Cardiovascular: Positive for palpitations. Negative for chest pain, orthopnea, claudication and leg swelling.  Gastrointestinal: Negative.   Skin: Negative.   Neurological: Positive for dizziness. Negative for weakness and headaches.  Endo/Heme/Allergies: Negative.   Psychiatric/Behavioral: The patient is nervous/anxious.     Objective:  BP (!) 100/58 (BP Location: Right Arm, Patient Position: Sitting, Cuff Size: Normal)   Pulse 64   Temp 97.6 F (36.4 C) (Oral)   Resp 16   Wt 181 lb (82.1 kg)   BMI 28.35 kg/m   Physical Exam  Constitutional: She is oriented to person, place, and time and well-developed, well-nourished, and in no distress.  HENT:  Head: Normocephalic and atraumatic.  Eyes: Conjunctivae are normal. No scleral icterus.  Neck: No thyromegaly present.  Cardiovascular: Normal rate, regular rhythm and normal heart sounds.   Pulmonary/Chest: Effort normal and breath sounds normal.  Abdominal: Soft.  Neurological: She is alert and oriented to person, place, and time.  Skin: Skin is warm and dry.  Psychiatric: Mood, memory, affect and judgment normal.    Assessment and Plan :   1. Essential hypertension Labile. Patient may take half of a Clonidine at suppertime to help control the evening BP better. 2.Chronic  Anxiety 3.CAD    HPI, Exam and A&P Transcribed under the direction and in the presence of [name]., MD. Electronically Signed: Althea Charon, RMA I have done the exam and reviewed the chart and it is accurate to the best of my knowledge. Development worker, community has been used and  any errors in dictation or transcription are unintentional. Miguel Aschoff M.D. [facility]

## 2016-05-13 ENCOUNTER — Other Ambulatory Visit: Payer: Self-pay | Admitting: Physician Assistant

## 2016-05-13 DIAGNOSIS — I10 Essential (primary) hypertension: Secondary | ICD-10-CM

## 2016-05-21 ENCOUNTER — Other Ambulatory Visit: Payer: Self-pay | Admitting: Family Medicine

## 2016-05-21 DIAGNOSIS — F419 Anxiety disorder, unspecified: Secondary | ICD-10-CM

## 2016-05-23 ENCOUNTER — Other Ambulatory Visit: Payer: Self-pay | Admitting: Family Medicine

## 2016-05-23 DIAGNOSIS — F419 Anxiety disorder, unspecified: Secondary | ICD-10-CM

## 2016-06-09 DIAGNOSIS — R42 Dizziness and giddiness: Secondary | ICD-10-CM | POA: Diagnosis not present

## 2016-06-09 DIAGNOSIS — F419 Anxiety disorder, unspecified: Secondary | ICD-10-CM | POA: Diagnosis not present

## 2016-06-09 DIAGNOSIS — I10 Essential (primary) hypertension: Secondary | ICD-10-CM | POA: Diagnosis not present

## 2016-06-09 DIAGNOSIS — K219 Gastro-esophageal reflux disease without esophagitis: Secondary | ICD-10-CM | POA: Diagnosis not present

## 2016-06-09 DIAGNOSIS — I208 Other forms of angina pectoris: Secondary | ICD-10-CM | POA: Diagnosis not present

## 2016-06-09 DIAGNOSIS — R0602 Shortness of breath: Secondary | ICD-10-CM | POA: Diagnosis not present

## 2016-06-09 DIAGNOSIS — R000 Tachycardia, unspecified: Secondary | ICD-10-CM | POA: Diagnosis not present

## 2016-06-09 DIAGNOSIS — I2510 Atherosclerotic heart disease of native coronary artery without angina pectoris: Secondary | ICD-10-CM | POA: Diagnosis not present

## 2016-06-09 DIAGNOSIS — I209 Angina pectoris, unspecified: Secondary | ICD-10-CM | POA: Diagnosis not present

## 2016-06-09 DIAGNOSIS — R002 Palpitations: Secondary | ICD-10-CM | POA: Diagnosis not present

## 2016-06-09 DIAGNOSIS — E784 Other hyperlipidemia: Secondary | ICD-10-CM | POA: Diagnosis not present

## 2016-06-15 ENCOUNTER — Other Ambulatory Visit: Payer: Self-pay | Admitting: Family Medicine

## 2016-06-15 NOTE — Telephone Encounter (Addendum)
[facility] pharmacy faxed a request for a 90-days supply on the following medication. Thanks CC  cloNIDine (CATAPRES) 0.1 MG tablet  Take 1 tablet ( 0.2 MG Total ) by mouth 3 (three) times.

## 2016-06-16 MED ORDER — CLONIDINE HCL 0.1 MG PO TABS: 0.1000 mg | ORAL_TABLET | Freq: Three times a day (TID) | ORAL | 3 refills | Status: DC

## 2016-08-05 ENCOUNTER — Ambulatory Visit (INDEPENDENT_AMBULATORY_CARE_PROVIDER_SITE_OTHER): Payer: Medicare Other

## 2016-08-05 ENCOUNTER — Ambulatory Visit (INDEPENDENT_AMBULATORY_CARE_PROVIDER_SITE_OTHER): Payer: Medicare Other | Admitting: Family Medicine

## 2016-08-05 VITALS — BP 116/62 | HR 72 | Ht 67.0 in | Wt 177.6 lb

## 2016-08-05 VITALS — BP 116/62 | HR 68 | Temp 98.5°F | Ht 67.0 in | Wt 177.6 lb

## 2016-08-05 DIAGNOSIS — I10 Essential (primary) hypertension: Secondary | ICD-10-CM

## 2016-08-05 DIAGNOSIS — F5109 Other insomnia not due to a substance or known physiological condition: Secondary | ICD-10-CM | POA: Diagnosis not present

## 2016-08-05 DIAGNOSIS — Z0000 Encounter for general adult medical examination without abnormal findings: Secondary | ICD-10-CM | POA: Diagnosis not present

## 2016-08-05 DIAGNOSIS — E119 Type 2 diabetes mellitus without complications: Secondary | ICD-10-CM | POA: Diagnosis not present

## 2016-08-05 LAB — POCT GLYCOSYLATED HEMOGLOBIN (HGB A1C)
Est. average glucose Bld gHb Est-mCnc: 148
Hemoglobin A1C: 6.8

## 2016-08-05 NOTE — Progress Notes (Signed)
Patient: Valerie Foster Female    DOB: 15-Apr-1933   83 y.o.   MRN: [id number] Visit Date: 08/05/2016  Today's Provider: [name], MD   Chief Complaint  Patient presents with  . Hypertension   Subjective:    HPI Valerie Foster is here to FU on her AWV with the nurse health advisor. It is recommended to initiate a plan for her BMI, which is 27.82 today.     Hypertension, follow-up:  BP Readings from Last 3 Encounters:  08/05/16 116/62  08/05/16 116/62  05/12/16 (!) 100/58    She was last seen for hypertension 6 months ago.  BP at that visit was 100/58. Management since that visit includes advising pt to take 1/2 clonidine at suppertime to help control evening BP better.. She reports excellent compliance with treatment. Dr. Clayborn Bigness decreased her Metoprolol to 25 mg at her last FU due to bradycardia. She is not having side effects.  She is exercising. Walks 2-3 times per week. She is adherent to low salt diet.   She is experiencing palpitations.  Patient denies chest pain, chest pressure/discomfort, claudication, dyspnea, exertional chest pressure/discomfort, fatigue, irregular heart beat, lower extremity edema, near-syncope, orthopnea and syncope.   Cardiovascular risk factors include advanced age (older than 17 for men, 67 for women), dyslipidemia and hypertension.    Weight trend: decreasing steadily Wt Readings from Last 3 Encounters:  08/05/16 177 lb 9.6 oz (80.6 kg)  08/05/16 177 lb 9.6 oz (80.6 kg)  05/12/16 181 lb (82.1 kg)    Current diet: in general, a "healthy" diet    ------------------------------------------------------------------------   Allergies  Allergen Reactions  . Statins     Joint and muscle pain severe  . Sulfa Antibiotics      Current Outpatient Prescriptions:  .  aspirin 81 MG tablet, Take by mouth., Disp: , Rfl:  .  cetirizine (ZYRTEC) 10 MG tablet, Take 1 tablet (10 mg total) by mouth daily. (Patient taking differently:  Take 10 mg by mouth daily. ), Disp: , Rfl:  .  cloNIDine (CATAPRES) 0.1 MG tablet, Take 1 tablet (0.1 mg total) by mouth 3 (three) times daily. (Patient taking differently: Take 0.1 mg by mouth 3 (three) times daily. ), Disp: 270 tablet, Rfl: 3 .  cyclobenzaprine (FLEXERIL) 10 MG tablet, Take 1 tablet (10 mg total) by mouth at bedtime., Disp: 30 tablet, Rfl: 5 .  diltiazem (DILACOR XR) 120 MG 24 hr capsule, TAKE 1 CAPSULE (120 MG TOTAL) BY MOUTH DAILY., Disp: 30 capsule, Rfl: 11 .  DUREZOL 0.05 % EMUL, INSTILL 1 DROP INTO LEFT EYE TWICE DAILY AFTER PATCH IS REMOVED, Disp: , Rfl: 0 .  fluticasone (FLONASE) 50 MCG/ACT nasal spray, Place into the nose., Disp: , Rfl:  .  hydrochlorothiazide (HYDRODIURIL) 25 MG tablet, TAKE 1 TABLET EVERY DAY, Disp: 90 tablet, Rfl: 3 .  LORazepam (ATIVAN) 0.5 MG tablet, TAKE 1 TABLET BY MOUTH TWICE A DAY AS NEEDED FOR ANXIETY, Disp: 60 tablet, Rfl: 5 .  meloxicam (MOBIC) 7.5 MG tablet, Take 7.5 mg by mouth daily. , Disp: , Rfl:  .  metFORMIN (GLUCOPHAGE) 500 MG tablet, TAKE 1 TABLET EVERY DAY, Disp: 90 tablet, Rfl: 3 .  metoprolol succinate (TOPROL-XL) 50 MG 24 hr tablet, TAKE 1 TABLET EVERY DAY (Patient taking differently: TAKE 1 TABLET EVERY DAY (pt taking 1/2 tablet daily currently)), Disp: 90 tablet, Rfl: 3 .  olmesartan (BENICAR) 40 MG tablet, Take 1 tablet (40 mg total) by mouth  daily., Disp: 30 tablet, Rfl: 12 .  sodium chloride (OCEAN) 0.65 % SOLN nasal spray, Place 2 sprays into both nostrils every 2 (two) hours while awake., Disp: , Rfl: 0 .  tolterodine (DETROL LA) 4 MG 24 hr capsule, Take 1 capsule (4 mg total) by mouth daily. (Patient taking differently: Take 4 mg by mouth daily. ), Disp: 30 capsule, Rfl: 3  Review of Systems  Constitutional: Negative.   HENT: Negative.   Eyes: Negative.   Respiratory: Negative.   Cardiovascular: Positive for palpitations (stable per pt). Negative for chest pain and leg swelling.  Gastrointestinal: Negative.     Endocrine: Negative.   Genitourinary: Positive for urgency (stable per pt). Negative for decreased urine volume, difficulty urinating, dyspareunia, dysuria, enuresis, flank pain, frequency, genital sores, hematuria, menstrual problem, pelvic pain, vaginal bleeding, vaginal discharge and vaginal pain.  Musculoskeletal: Negative.   Skin: Negative.   Allergic/Immunologic: Negative.   Neurological: Negative.   Hematological: Negative.   Psychiatric/Behavioral: Positive for sleep disturbance ( improved on Ativan ). Negative for agitation, behavioral problems, confusion, decreased concentration, dysphoric mood, hallucinations, self-injury and suicidal ideas. The patient is not nervous/anxious and is not hyperactive.     Social History  Substance Use Topics  . Smoking status: Never Smoker  . Smokeless tobacco: Never Used  . Alcohol use No   Objective:   BP 116/62   Pulse 72   Ht 5\' 7"  (1.702 m)   Wt 177 lb 9.6 oz (80.6 kg)   BMI 27.82 kg/m  Vitals:   08/05/16 1513 08/05/16 1551  BP: 116/62   Pulse:  72  Weight: 177 lb 9.6 oz (80.6 kg)   Height: 5\' 7"  (1.702 m)      Physical Exam  Constitutional: She is oriented to person, place, and time. She appears well-developed and well-nourished.  HENT:  Head: Normocephalic and atraumatic.  Right Ear: External ear normal.  Left Ear: External ear normal.  Nose: Nose normal.  Eyes: Conjunctivae are normal.  Neck: Normal range of motion. Neck supple. No thyromegaly present.  Cardiovascular: Normal rate, regular rhythm and normal heart sounds.   No carotid bruit  Pulmonary/Chest: Effort normal and breath sounds normal. No respiratory distress.  Abdominal: Soft.  Neurological: She is alert and oriented to person, place, and time.  Skin: Skin is warm and dry.  Psychiatric: She has a normal mood and affect. Her behavior is normal. Judgment and thought content normal.        Assessment & Plan:     1. Essential hypertension  2. Type 2  diabetes mellitus without complication, without long-term current use of insulin (HCC)  - POCT glycosylated hemoglobin (Hb A1C)--6.8 today.  3. Insomnia 4.CAD        I have done the exam and reviewed the above chart and it is accurate to the best of my knowledge. Development worker, community has been used in this note in any air is in the dictation or transcription are unintentional.  [name], MD  [place]

## 2016-08-05 NOTE — Patient Instructions (Signed)
Valerie Foster , Thank you for taking time to come for your Medicare Wellness Visit. I appreciate your ongoing commitment to your health goals. Please review the following plan we discussed and let me know if I can assist you in the future.   Screening recommendations/referrals: Colonoscopy: completed 05/19/13 Mammogram: completed 05/30/13 Bone Density: completed 06/14/07 Recommended yearly ophthalmology/optometry visit for glaucoma screening and checkup Recommended yearly dental visit for hygiene and checkup  Vaccinations: Influenza vaccine: up to date, due 10/2016 Pneumococcal vaccine: completed series Tdap vaccine: completed 04/09/16, due 03/2026 Shingles vaccine: declined  Advanced directives: Please bring a copy of your POA (Power of [place]) and/or Living Will to your next appointment.   Conditions/risks identified: Recommend increasing water intake to 4-6 glasses a day.  Next appointment: None, need to schedule 1 year AWV.   Preventive Care 81 Years and Older, Female Preventive care refers to lifestyle choices and visits with your health care provider that can promote health and wellness. What does preventive care include?  A yearly physical exam. This is also called an annual well check.  Dental exams once or twice a year.  Routine eye exams. Ask your health care provider how often you should have your eyes checked.  Personal lifestyle choices, including:  Daily care of your teeth and gums.  Regular physical activity.  Eating a healthy diet.  Avoiding tobacco and drug use.  Limiting alcohol use.  Practicing safe sex.  Taking low-dose aspirin every day.  Taking vitamin and mineral supplements as recommended by your health care provider. What happens during an annual well check? The services and screenings done by your health care provider during your annual well check will depend on your age, overall health, lifestyle risk factors, and family history of  disease. Counseling  Your health care provider may ask you questions about your:  Alcohol use.  Tobacco use.  Drug use.  Emotional well-being.  Home and relationship well-being.  Sexual activity.  Eating habits.  History of falls.  Memory and ability to understand (cognition).  Work and work Statistician.  Reproductive health. Screening  You may have the following tests or measurements:  Height, weight, and BMI.  Blood pressure.  Lipid and cholesterol levels. These may be checked every 5 years, or more frequently if you are over 25 years old.  Skin check.  Lung cancer screening. You may have this screening every year starting at age 21 if you have a 30-pack-year history of smoking and currently smoke or have quit within the past 15 years.  Fecal occult blood test (FOBT) of the stool. You may have this test every year starting at age 26.  Flexible sigmoidoscopy or colonoscopy. You may have a sigmoidoscopy every 5 years or a colonoscopy every 10 years starting at age 51.  Hepatitis C blood test.  Hepatitis B blood test.  Sexually transmitted disease (STD) testing.  Diabetes screening. This is done by checking your blood sugar (glucose) after you have not eaten for a while (fasting). You may have this done every 1-3 years.  Bone density scan. This is done to screen for osteoporosis. You may have this done starting at age 57.  Mammogram. This may be done every 1-2 years. Talk to your health care provider about how often you should have regular mammograms. Talk with your health care provider about your test results, treatment options, and if necessary, the need for more tests. Vaccines  Your health care provider may recommend certain vaccines, such as:  Influenza vaccine.  This is recommended every year.  Tetanus, diphtheria, and acellular pertussis (Tdap, Td) vaccine. You may need a Td booster every 10 years.  Zoster vaccine. You may need this after age  40.  Pneumococcal 13-valent conjugate (PCV13) vaccine. One dose is recommended after age 73.  Pneumococcal polysaccharide (PPSV23) vaccine. One dose is recommended after age 31. Talk to your health care provider about which screenings and vaccines you need and how often you need them. This information is not intended to replace advice given to you by your health care provider. Make sure you discuss any questions you have with your health care provider. Document Released: 03/08/2015 Document Revised: 10/30/2015 Document Reviewed: 12/11/2014 Elsevier Interactive Patient Education  2017 [place] Prevention in the Home Falls can cause injuries. They can happen to people of all ages. There are many things you can do to make your home safe and to help prevent falls. What can I do on the outside of my home?  Regularly fix the edges of walkways and driveways and fix any cracks.  Remove anything that might make you trip as you walk through a door, such as a raised step or threshold.  Trim any bushes or trees on the path to your home.  Use bright outdoor lighting.  Clear any walking paths of anything that might make someone trip, such as rocks or tools.  Regularly check to see if handrails are loose or broken. Make sure that both sides of any steps have handrails.  Any raised decks and porches should have guardrails on the edges.  Have any leaves, snow, or ice cleared regularly.  Use sand or salt on walking paths during winter.  Clean up any spills in your garage right away. This includes oil or grease spills. What can I do in the bathroom?  Use night lights.  Install grab bars by the toilet and in the tub and shower. Do not use towel bars as grab bars.  Use non-skid mats or decals in the tub or shower.  If you need to sit down in the shower, use a plastic, non-slip stool.  Keep the floor dry. Clean up any water that spills on the floor as soon as it happens.  Remove  soap buildup in the tub or shower regularly.  Attach bath mats securely with double-sided non-slip rug tape.  Do not have throw rugs and other things on the floor that can make you trip. What can I do in the bedroom?  Use night lights.  Make sure that you have a light by your bed that is easy to reach.  Do not use any sheets or blankets that are too big for your bed. They should not hang down onto the floor.  Have a firm chair that has side arms. You can use this for support while you get dressed.  Do not have throw rugs and other things on the floor that can make you trip. What can I do in the kitchen?  Clean up any spills right away.  Avoid walking on wet floors.  Keep items that you use a lot in easy-to-reach places.  If you need to reach something above you, use a strong step stool that has a grab bar.  Keep electrical cords out of the way.  Do not use floor polish or wax that makes floors slippery. If you must use wax, use non-skid floor wax.  Do not have throw rugs and other things on the floor that can make  you trip. What can I do with my stairs?  Do not leave any items on the stairs.  Make sure that there are handrails on both sides of the stairs and use them. Fix handrails that are broken or loose. Make sure that handrails are as long as the stairways.  Check any carpeting to make sure that it is firmly attached to the stairs. Fix any carpet that is loose or worn.  Avoid having throw rugs at the top or bottom of the stairs. If you do have throw rugs, attach them to the floor with carpet tape.  Make sure that you have a light switch at the top of the stairs and the bottom of the stairs. If you do not have them, ask someone to add them for you. What else can I do to help prevent falls?  Wear shoes that:  Do not have high heels.  Have rubber bottoms.  Are comfortable and fit you well.  Are closed at the toe. Do not wear sandals.  If you use a  stepladder:  Make sure that it is fully opened. Do not climb a closed stepladder.  Make sure that both sides of the stepladder are locked into place.  Ask someone to hold it for you, if possible.  Clearly mark and make sure that you can see:  Any grab bars or handrails.  First and last steps.  Where the edge of each step is.  Use tools that help you move around (mobility aids) if they are needed. These include:  Canes.  Walkers.  Scooters.  Crutches.  Turn on the lights when you go into a dark area. Replace any light bulbs as soon as they burn out.  Set up your furniture so you have a clear path. Avoid moving your furniture around.  If any of your floors are uneven, fix them.  If there are any pets around you, be aware of where they are.  Review your medicines with your doctor. Some medicines can make you feel dizzy. This can increase your chance of falling. Ask your doctor what other things that you can do to help prevent falls. This information is not intended to replace advice given to you by your health care provider. Make sure you discuss any questions you have with your health care provider. Document Released: 12/06/2008 Document Revised: 07/18/2015 Document Reviewed: 03/16/2014 Elsevier Interactive Patient Education  2017 [employer].

## 2016-08-05 NOTE — Progress Notes (Signed)
Subjective:   Valerie Foster is a 83 y.o. female who presents for Medicare Annual (Subsequent) preventive examination.  Review of Systems:  N/A  Cardiac Risk Factors include: advanced age (>71men, >24 women);diabetes mellitus;dyslipidemia;hypertension     Objective:     Vitals: BP 116/62 (BP Location: Right Arm)   Pulse 68   Temp 98.5 F (36.9 C) (Oral)   Ht 5\' 7"  (1.702 m)   Wt 177 lb 9.6 oz (80.6 kg)   BMI 27.82 kg/m   Body mass index is 27.82 kg/m.   Tobacco History  Smoking Status  . Never Smoker  Smokeless Tobacco  . Never Used     Counseling given: Not Answered   Past Medical History:  Diagnosis Date  . Anginal pain ([place])   . Arthritis   . Coronary artery disease   . Diabetes mellitus without complication ([place])   . Diverticulitis   . Dysrhythmia   . Heart disease   . Hypercholesteremia   . Hypertension   . Palpitations    Past Surgical History:  Procedure Laterality Date  . CARDIAC SURGERY    . CATARACT EXTRACTION W/PHACO Left 01/28/2015   Procedure: CATARACT EXTRACTION PHACO AND INTRAOCULAR LENS PLACEMENT (IOC);  Surgeon: Estill Cotta, MD;  Location: [facility] ORS;  Service: Ophthalmology;  Laterality: Left;  [place] 01:13 AP% 58.5 CDE 30.55 fluid pack lot # [id number]  . CATARACT EXTRACTION W/PHACO Right 03/18/2015   Procedure: CATARACT EXTRACTION PHACO AND INTRAOCULAR LENS PLACEMENT (IOC);  Surgeon: Estill Cotta, MD;  Location: [facility] ORS;  Service: Ophthalmology;  Laterality: Right;  [place]    1:08.3 AP%   24 CDE   30.33 fluid pack lot # [id number]  . CORONARY ANGIOPLASTY     stent  . CORONARY STENT PLACEMENT  06/17/2011  . FINGER SURGERY     for trigger finger  . TONSILLECTOMY     Family History  Problem Relation Age of Onset  . Hypertension Mother   . Diabetes Mother   . Heart attack Father   . Colon cancer Sister    History  Sexual Activity  . Sexual activity: Not on file    Outpatient Encounter Prescriptions as of 08/05/2016    Medication Sig  . aspirin 81 MG tablet Take by mouth.  . cetirizine (ZYRTEC) 10 MG tablet Take 1 tablet (10 mg total) by mouth daily. (Patient taking differently: Take 10 mg by mouth daily. )  . cloNIDine (CATAPRES) 0.1 MG tablet Take 1 tablet (0.1 mg total) by mouth 3 (three) times daily. (Patient taking differently: Take 0.1 mg by mouth 3 (three) times daily. )  . cyclobenzaprine (FLEXERIL) 10 MG tablet Take 1 tablet (10 mg total) by mouth at bedtime.  Marland Kitchen diltiazem (DILACOR XR) 120 MG 24 hr capsule TAKE 1 CAPSULE (120 MG TOTAL) BY MOUTH DAILY.  . DUREZOL 0.05 % EMUL INSTILL 1 DROP INTO LEFT EYE TWICE DAILY AFTER PATCH IS REMOVED  . fluticasone (FLONASE) 50 MCG/ACT nasal spray Place into the nose.  . hydrochlorothiazide (HYDRODIURIL) 25 MG tablet TAKE 1 TABLET EVERY DAY  . LORazepam (ATIVAN) 0.5 MG tablet TAKE 1 TABLET BY MOUTH TWICE A DAY AS NEEDED FOR ANXIETY  . meloxicam (MOBIC) 7.5 MG tablet Take 7.5 mg by mouth daily.   . metFORMIN (GLUCOPHAGE) 500 MG tablet TAKE 1 TABLET EVERY DAY  . metoprolol succinate (TOPROL-XL) 50 MG 24 hr tablet TAKE 1 TABLET EVERY DAY (Patient taking differently: TAKE 1 TABLET EVERY DAY (pt taking 1/2 tablet daily currently))  .  olmesartan (BENICAR) 40 MG tablet Take 1 tablet (40 mg total) by mouth daily.  Marland Kitchen tolterodine (DETROL LA) 4 MG 24 hr capsule Take 1 capsule (4 mg total) by mouth daily. (Patient taking differently: Take 4 mg by mouth daily. )  . sodium chloride (OCEAN) 0.65 % SOLN nasal spray Place 2 sprays into both nostrils every 2 (two) hours while awake.  . [DISCONTINUED] diltiazem (DILACOR XR) 120 MG 24 hr capsule TAKE 1 CAPSULE (120 MG TOTAL) BY MOUTH DAILY.  . [DISCONTINUED] gabapentin (NEURONTIN) 100 MG capsule Take 200 mg by mouth at bedtime. Reported on 08/13/2015   No facility-administered encounter medications on file as of 08/05/2016.     Activities of Daily Living In your present state of health, do you have any difficulty performing the  following activities: 08/05/2016 08/13/2015  Hearing? N N  Vision? N N  Difficulty concentrating or making decisions? Y N  Walking or climbing stairs? N N  Dressing or bathing? N N  Doing errands, shopping? N N  Preparing Food and eating ? N -  Using the Toilet? N -  In the past six months, have you accidently leaked urine? Y -  Do you have problems with loss of bowel control? N -  Managing your Medications? N -  Managing your Finances? N -  Housekeeping or managing your Housekeeping? N -  Some recent data might be hidden    Patient Care Team: Jerrol Banana., MD as PCP - General (Family Medicine) Dingeldein, Remo Lipps, MD as Consulting Physician (Ophthalmology) Yolonda Kida, MD as Consulting Physician (Cardiology) Magnus Sinning, MD as Referring Physician (Nephrology)    Assessment:     Exercise Activities and Dietary recommendations Current Exercise Habits: Home exercise routine, Type of exercise: walking, Time (Minutes): 25, Frequency (Times/Week): 3, Weekly Exercise (Minutes/Week): 75, Intensity: Mild, Exercise limited by: None identified  Goals    . Increase water intake          Recommend increasing water intake to 4-6 glasses a day.      Fall Risk Fall Risk  08/05/2016 08/13/2015 01/31/2015  Falls in the past year? No No No   Depression Screen PHQ 2/9 Scores 08/05/2016 08/05/2016 08/13/2015 01/31/2015  PHQ - 2 Score 0 0 0 0  PHQ- 9 Score 1 - - -     Cognitive Function     6CIT Screen 08/05/2016 08/05/2016  What Year? 0 points 0 points  What month? 0 points 0 points  What time? 0 points 0 points  Count back from 20 0 points -  Months in reverse 0 points -  Repeat phrase 2 points -  Total Score 2 -    Immunization History  Administered Date(s) Administered  . Influenza, High Dose Seasonal PF 01/31/2015, 12/31/2015  . Pneumococcal Conjugate-13 08/29/2013  . Pneumococcal Polysaccharide-23 01/28/2006  . Td 04/09/2016   Screening Tests Health  Maintenance  Topic Date Due  . INFLUENZA VACCINE  09/23/2016  . OPHTHALMOLOGY EXAM  09/30/2016  . HEMOGLOBIN A1C  10/07/2016  . FOOT EXAM  04/09/2017  . TETANUS/TDAP  04/09/2026  . DEXA SCAN  Completed  . PNA vac Low Risk Adult  Completed      Plan:  I have personally reviewed and addressed the Medicare Annual Wellness questionnaire and have noted the following in the patient's chart:  A. Medical and social history B. Use of alcohol, tobacco or illicit drugs  C. Current medications and supplements D. Functional ability and status E.  Nutritional status F.  Physical activity G. Advance directives H. List of other physicians I.  Hospitalizations, surgeries, and ER visits in previous 12 months J.  [place] such as hearing and vision if needed, cognitive and depression L. Referrals and appointments - none  In addition, I have reviewed and discussed with patient certain preventive protocols, quality metrics, and best practice recommendations. A written personalized care plan for preventive services as well as general preventive health recommendations were provided to patient.  See attached scanned questionnaire for additional information.   Signed,  Fabio Neighbors, LPN Nurse Health Advisor   MD Recommendations: None.

## 2016-08-24 ENCOUNTER — Other Ambulatory Visit: Payer: Self-pay | Admitting: Family Medicine

## 2016-09-28 IMAGING — CR DG CHEST 2V
1 series · 2 of 2 positions shown · non-contrast
Comparison: Portable chest x-ray June 07, 2011

CLINICAL DATA: Mid back pain for the past month, pleuritic
component,no known injury and not related to activity.

EXAM:
CHEST  2 VIEW

[Series 1: kdxr chest pa (or ap) and lat · 0.14mm/px · 2 of 2 slices shown]
[im 1/2]
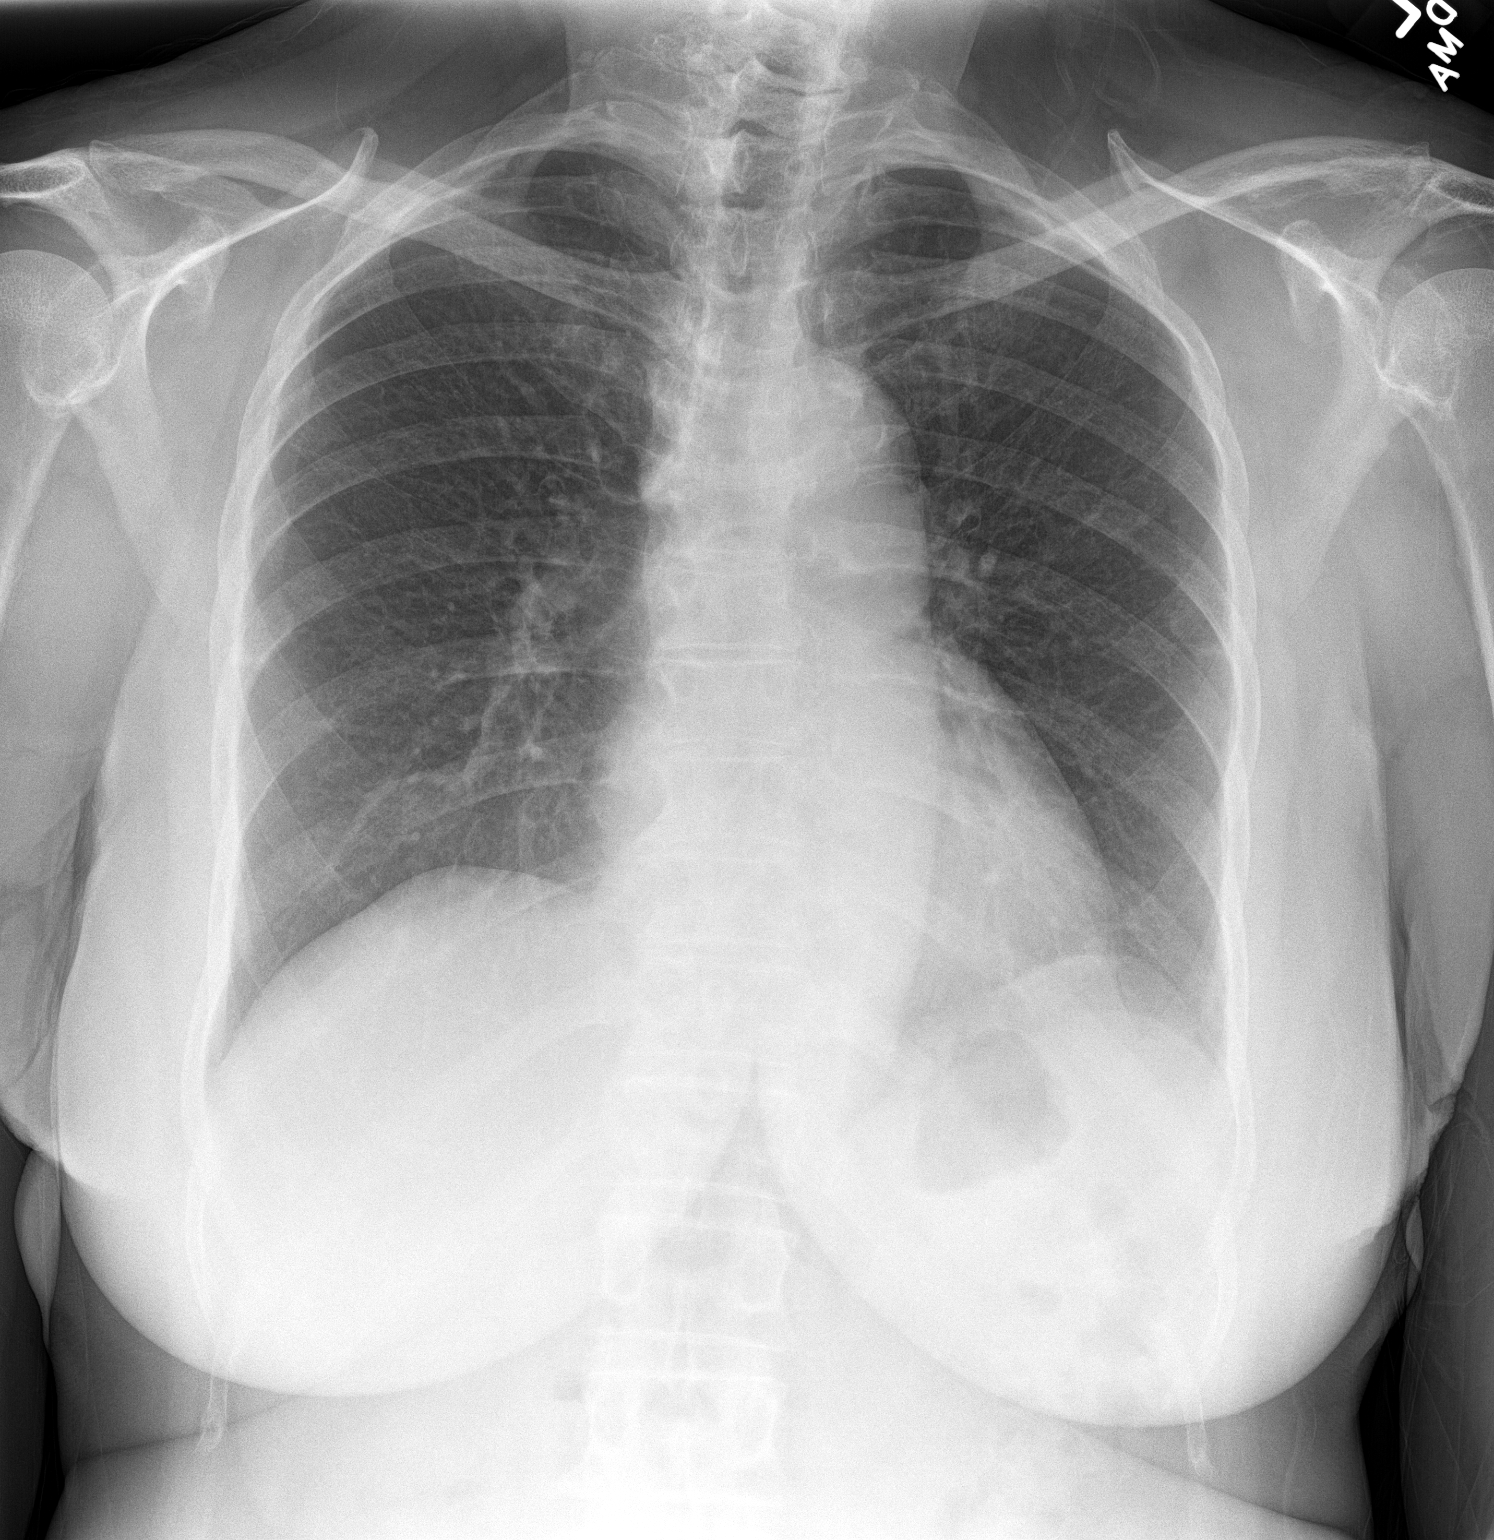
[im 2/2]
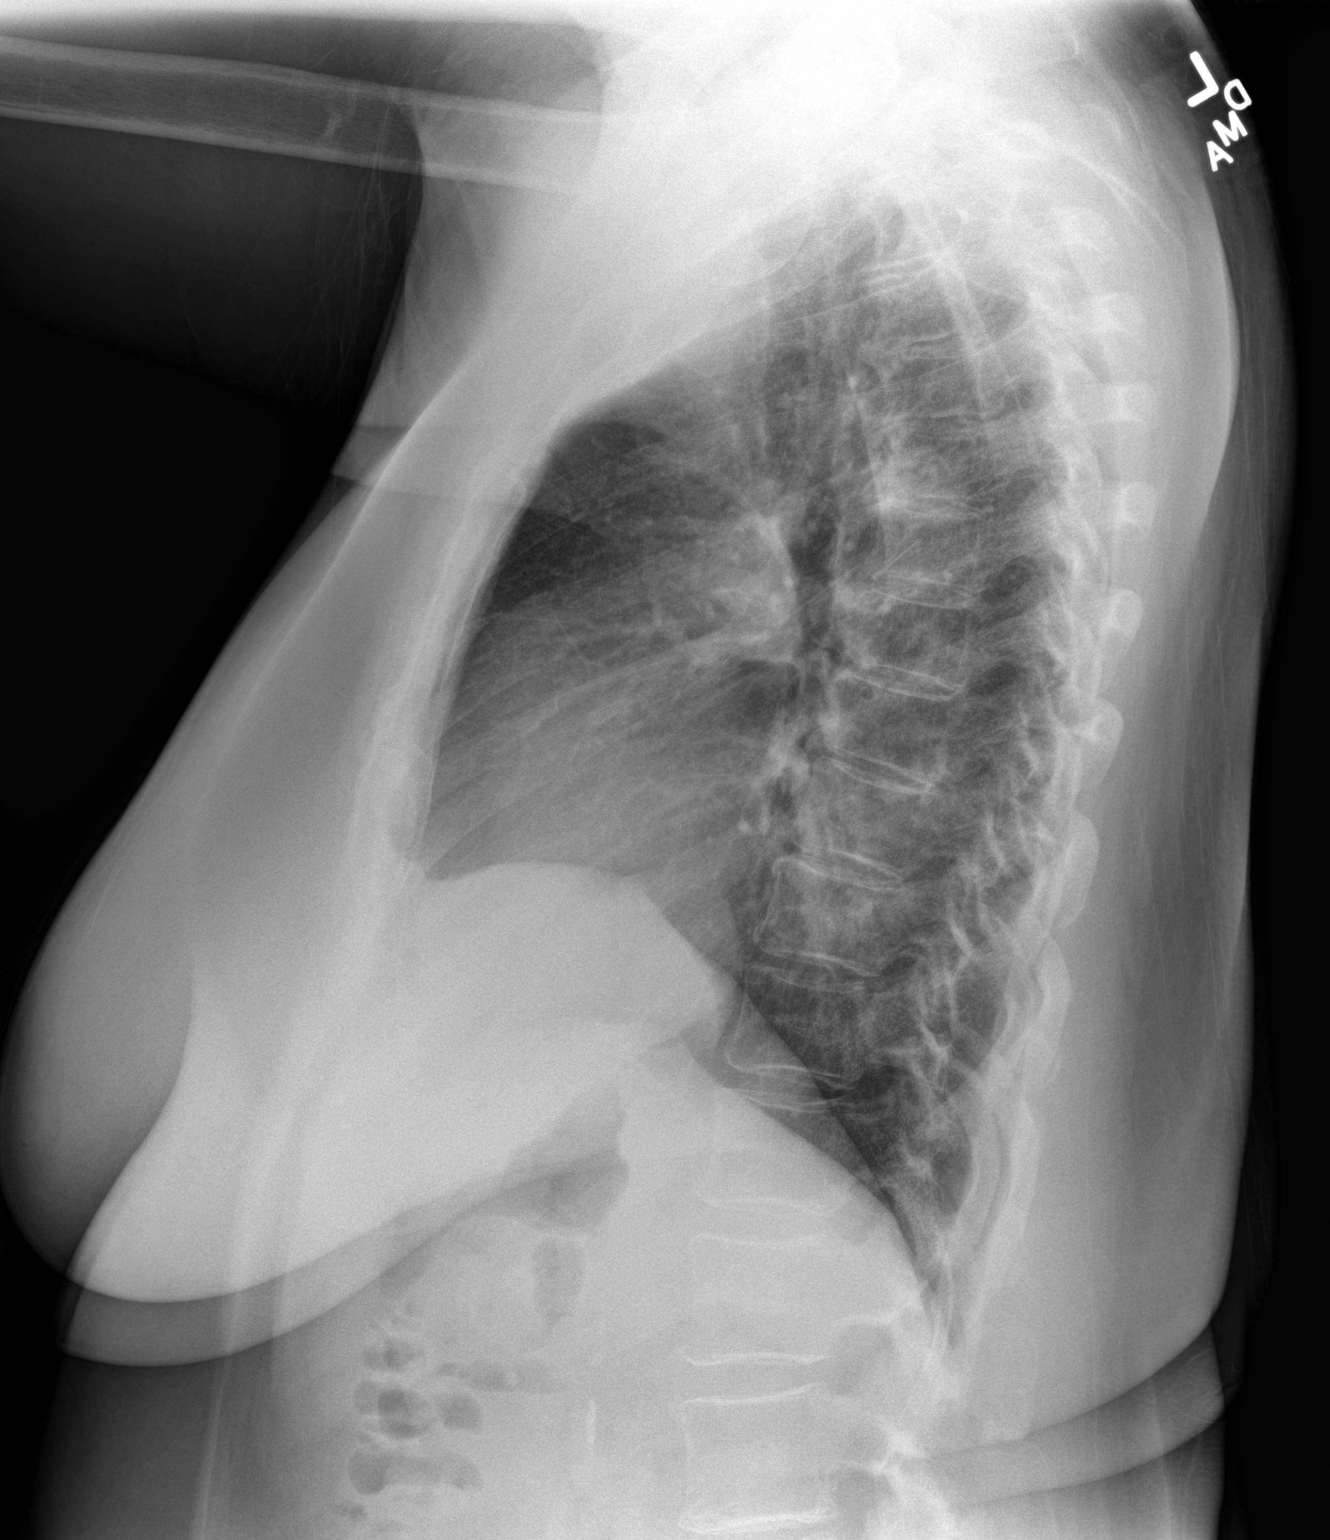

[2 of 2 positions shown; findings below may reference images not displayed]

FINDINGS: The lungs are adequately inflated. There mildly increased lung
markings in the left lower lobe. The heart and pulmonary vascularity
are normal. There is mild tortuosity of the descending thoracic
aorta. The bony thorax is unremarkable.
IMPRESSION: Left lower lobe atelectasis or infiltrate. Follow-up radiographs in
3-4 weeks are recommended to assure clearing. Follow-up sooner with
radiographs or CT scanning is recommended if the patient's symptoms
worsen despite anticipated antibiotic therapy.

## 2016-11-24 ENCOUNTER — Telehealth: Payer: Self-pay | Admitting: Family Medicine

## 2016-11-24 DIAGNOSIS — I10 Essential (primary) hypertension: Secondary | ICD-10-CM

## 2016-11-24 MED ORDER — DILTIAZEM HCL ER 120 MG PO CP24: 120.0000 mg | ORAL_CAPSULE | Freq: Every day | ORAL | 3 refills | Status: DC

## 2016-11-24 NOTE — Telephone Encounter (Signed)
RX sent in and patient advised that insurance may not cover it if she just filled the RX and to check with pharmacy and see what can be done. Patient understood-Valerie Foster, RMA

## 2016-11-24 NOTE — Telephone Encounter (Signed)
Pt states she picked her Rx for diltiazem (DILACOR XR) 120 MG 24 hr capsule from the pharmacy and she has misplaced it.  Pt is requesting another Rx sent in but is requesting a 90 day supply.  [facility].  LG#[phone]/[id number]

## 2016-12-02 DIAGNOSIS — E119 Type 2 diabetes mellitus without complications: Secondary | ICD-10-CM | POA: Diagnosis not present

## 2016-12-02 LAB — HM DIABETES EYE EXAM

## 2016-12-03 ENCOUNTER — Ambulatory Visit: Payer: Medicare Other | Admitting: Family Medicine

## 2016-12-11 ENCOUNTER — Other Ambulatory Visit: Payer: Self-pay | Admitting: Family Medicine

## 2016-12-11 DIAGNOSIS — F419 Anxiety disorder, unspecified: Secondary | ICD-10-CM

## 2016-12-23 ENCOUNTER — Ambulatory Visit (INDEPENDENT_AMBULATORY_CARE_PROVIDER_SITE_OTHER): Payer: Medicare Other | Admitting: Family Medicine

## 2016-12-23 VITALS — BP 128/64 | HR 68 | Temp 97.9°F | Resp 16 | Wt 176.0 lb

## 2016-12-23 DIAGNOSIS — G629 Polyneuropathy, unspecified: Secondary | ICD-10-CM | POA: Diagnosis not present

## 2016-12-23 DIAGNOSIS — E785 Hyperlipidemia, unspecified: Secondary | ICD-10-CM | POA: Diagnosis not present

## 2016-12-23 DIAGNOSIS — I10 Essential (primary) hypertension: Secondary | ICD-10-CM | POA: Diagnosis not present

## 2016-12-23 DIAGNOSIS — F5109 Other insomnia not due to a substance or known physiological condition: Secondary | ICD-10-CM | POA: Diagnosis not present

## 2016-12-23 DIAGNOSIS — Z23 Encounter for immunization: Secondary | ICD-10-CM

## 2016-12-23 DIAGNOSIS — E119 Type 2 diabetes mellitus without complications: Secondary | ICD-10-CM

## 2016-12-23 LAB — POCT UA - MICROALBUMIN: MICROALBUMIN (UR) POC: 50 mg/L

## 2016-12-23 LAB — POCT GLYCOSYLATED HEMOGLOBIN (HGB A1C): HEMOGLOBIN A1C: 7

## 2016-12-23 NOTE — Patient Instructions (Addendum)
Talk with Dr Clayborn Bigness about your cholesterol and treatment for this.-Repatha.

## 2016-12-23 NOTE — Progress Notes (Signed)
Valerie Foster  MRN: [id number] DOB: 1933/06/24  Subjective:  HPI  Patient is here for 4 months follow up. Last office visit was on 08/05/16, routine lab work was done on 04/09/16-it was noted that lipid level was better at that time. DM: patient states she checks her sugar at home and readings have been around 159, 169 and sometimes 129. She is having feet pain and some numbness and tingling sensation. Also some stiffness in the foot present. Lab Results  Component Value Date   HGBA1C 6.8 08/05/2016   Wt Readings from Last 3 Encounters:  12/23/16 176 lb (79.8 kg)  08/05/16 177 lb 9.6 oz (80.6 kg)  08/05/16 177 lb 9.6 oz (80.6 kg)   Patient is checking her b/p and readings mainly under 140s/78-82. No cardiac symptoms present. Patient Active Problem List   Diagnosis Date Noted  . OAB (overactive bladder) 03/20/2016  . Angina pectoris ([place]) 07/18/2014  . Arthritis 07/18/2014  . CAD in native artery 07/18/2014  . Chronic anxiety 07/18/2014  . Diabetes ([place]) 07/18/2014  . BP (high blood pressure) 07/18/2014  . Heart & renal disease, hypertensive, with heart failure ([place]) 07/18/2014  . Insomnia, matutinal 07/18/2014  . Benign neoplasm of colon 05/19/2013  . Acid reflux 05/19/2013  . Hypercholesteremia 05/19/2013  . Neuropathy 05/19/2013    Past Medical History:  Diagnosis Date  . Anginal pain ([place])   . Arthritis   . Coronary artery disease   . Diabetes mellitus without complication ([place])   . Diverticulitis   . Dysrhythmia   . Heart disease   . Hypercholesteremia   . Hypertension   . Palpitations     Social History   Social History  . Marital status: Married    Spouse name: N/A  . Number of children: N/A  . Years of education: N/A   Occupational History  . Not on file.   Social History Main Topics  . Smoking status: Never Smoker  . Smokeless tobacco: Never Used  . Alcohol use No  . Drug use: No  . Sexual activity: Not on file   Other Topics Concern  . Not  on file   Social History Narrative  . No narrative on file    Outpatient Encounter Prescriptions as of 12/23/2016  Medication Sig Note  . aspirin 81 MG tablet Take by mouth. 07/18/2014: Received from: [facility]  . cetirizine (ZYRTEC) 10 MG tablet Take 1 tablet (10 mg total) by mouth daily. (Patient taking differently: Take 10 mg by mouth daily. ) 04/09/2016: prn  . cloNIDine (CATAPRES) 0.1 MG tablet Take 1 tablet (0.1 mg total) by mouth 3 (three) times daily. (Patient taking differently: Take 0.1 mg by mouth 3 (three) times daily. )   . cyclobenzaprine (FLEXERIL) 10 MG tablet Take 1 tablet (10 mg total) by mouth at bedtime. 04/09/2016: prn  . diltiazem (DILACOR XR) 120 MG 24 hr capsule Take 1 capsule (120 mg total) by mouth daily.   . fluticasone (FLONASE) 50 MCG/ACT nasal spray Place into the nose. 07/18/2014: Received from: [facility]  . hydrochlorothiazide (HYDRODIURIL) 25 MG tablet TAKE 1 TABLET EVERY DAY   . LORazepam (ATIVAN) 0.5 MG tablet TAKE 1 TABLET BY MOUTH TWICE A DAY AS NEEDED FOR ANXIETY   . meloxicam (MOBIC) 7.5 MG tablet Take 7.5 mg by mouth daily.  07/18/2014: Received from: [facility]  . metFORMIN (GLUCOPHAGE) 500 MG tablet TAKE 1 TABLET EVERY DAY   . metoprolol succinate (TOPROL-XL) 50 MG 24  hr tablet TAKE 1 TABLET EVERY DAY   . olmesartan (BENICAR) 40 MG tablet Take 1 tablet (40 mg total) by mouth daily.   . sodium chloride (OCEAN) 0.65 % SOLN nasal spray Place 2 sprays into both nostrils every 2 (two) hours while awake. 04/09/2016: prn  . tolterodine (DETROL LA) 4 MG 24 hr capsule Take 1 capsule (4 mg total) by mouth daily. (Patient not taking: Reported on 12/23/2016)   . [DISCONTINUED] DUREZOL 0.05 % EMUL INSTILL 1 DROP INTO LEFT EYE TWICE DAILY AFTER PATCH IS REMOVED 01/31/2015: Received from: External Pharmacy   No facility-administered encounter medications on file as of 12/23/2016.     Allergies  Allergen  Reactions  . Statins     Joint and muscle pain severe  . Sulfa Antibiotics     Review of Systems  Constitutional: Negative.   Eyes: Negative.   Respiratory: Negative.   Cardiovascular: Negative.   Gastrointestinal: Negative.   Musculoskeletal: Positive for joint pain.  Skin: Negative.   Neurological: Positive for tingling.  Endo/Heme/Allergies: Negative.   Psychiatric/Behavioral: Negative.        Stable on the medication    Objective:  BP 128/64   Pulse 68   Temp 97.9 F (36.6 C)   Resp 16   Wt 176 lb (79.8 kg)   BMI 27.57 kg/m   Physical Exam  Constitutional: She is oriented to person, place, and time and well-developed, well-nourished, and in no distress.  HENT:  Head: Normocephalic and atraumatic.  Right Ear: External ear normal.  Left Ear: External ear normal.  Nose: Nose normal.  Eyes: Conjunctivae are normal. Pupils are equal, round, and reactive to light. No scleral icterus.  Neck: No thyromegaly present.  Cardiovascular: Normal rate, regular rhythm, normal heart sounds and intact distal pulses.  Exam reveals no gallop.   No murmur heard. Pulmonary/Chest: Effort normal and breath sounds normal. No respiratory distress. She has no wheezes.  Abdominal: Soft.  Neurological: She is alert and oriented to person, place, and time. Gait normal. Gait normal. GCS score is 15.  Skin: Skin is warm and dry.  Psychiatric: Mood, memory, affect and judgment normal.   Diabetic Foot Exam - Simple   Simple Foot Form Diabetic Foot exam was performed with the following findings:  Yes 12/23/2016  3:46 PM  Visual Inspection No deformities, no ulcerations, no other skin breakdown bilaterally:  Yes Sensation Testing See comments:  Yes Pulse Check Posterior Tibialis and Dorsalis pulse intact bilaterally:  Yes Comments Decreased sensation distally bilateral.    Assessment and Plan :  1. Type 2 diabetes mellitus without complication, without long-term current use of insulin  (HCC) 7.0-stable. Continue current regimen. Work on habits. - POCT HgB A1C - POCT UA - Microalbumin  2. Essential hypertension Stable.   3. Insomnia Stable on the medication.  4. Need for immunization against influenza - Flu vaccine HIGH DOSE PF (Fluzone High dose)  5. Neuropathy I think patient is getting earl neuropathy in her feet. Not severe enough to make medication changes. Advised patient to continue working on improving sugar readings.  6. Hyperlipidemia, unspecified hyperlipidemia type Not controlled. But patient did not tolerate statins or Zetia. Patient advised to ask Dr Clayborn Bigness about Repatha. Follow with cardiologist.  HPI, Exam and A&P transcribed by Tiffany Kocher, RMA under direction and in the presence of Miguel Aschoff, MD. I have done the exam and reviewed the chart and it is accurate to the best of my knowledge. Development worker, community has been  used and  any errors in dictation or transcription are unintentional. Miguel Aschoff M.D. [facility]

## 2016-12-25 DIAGNOSIS — F419 Anxiety disorder, unspecified: Secondary | ICD-10-CM | POA: Diagnosis not present

## 2016-12-25 DIAGNOSIS — R000 Tachycardia, unspecified: Secondary | ICD-10-CM | POA: Diagnosis not present

## 2016-12-25 DIAGNOSIS — I209 Angina pectoris, unspecified: Secondary | ICD-10-CM | POA: Diagnosis not present

## 2016-12-25 DIAGNOSIS — E7800 Pure hypercholesterolemia, unspecified: Secondary | ICD-10-CM | POA: Diagnosis not present

## 2016-12-25 DIAGNOSIS — E7849 Other hyperlipidemia: Secondary | ICD-10-CM | POA: Diagnosis not present

## 2016-12-25 DIAGNOSIS — K219 Gastro-esophageal reflux disease without esophagitis: Secondary | ICD-10-CM | POA: Diagnosis not present

## 2016-12-25 DIAGNOSIS — I2510 Atherosclerotic heart disease of native coronary artery without angina pectoris: Secondary | ICD-10-CM | POA: Diagnosis not present

## 2016-12-25 DIAGNOSIS — R0602 Shortness of breath: Secondary | ICD-10-CM | POA: Diagnosis not present

## 2016-12-25 DIAGNOSIS — R42 Dizziness and giddiness: Secondary | ICD-10-CM | POA: Diagnosis not present

## 2016-12-25 DIAGNOSIS — I10 Essential (primary) hypertension: Secondary | ICD-10-CM | POA: Diagnosis not present

## 2016-12-25 DIAGNOSIS — I208 Other forms of angina pectoris: Secondary | ICD-10-CM | POA: Diagnosis not present

## 2016-12-25 DIAGNOSIS — E785 Hyperlipidemia, unspecified: Secondary | ICD-10-CM | POA: Diagnosis not present

## 2017-01-05 ENCOUNTER — Other Ambulatory Visit: Payer: Self-pay | Admitting: Family Medicine

## 2017-01-06 ENCOUNTER — Other Ambulatory Visit: Payer: Self-pay | Admitting: Family Medicine

## 2017-01-06 MED ORDER — FLUTICASONE PROPIONATE 50 MCG/ACT NA SUSP: 2.0000 | Freq: Every day | NASAL | 12 refills | Status: DC

## 2017-01-06 NOTE — Telephone Encounter (Signed)
Pt contacted office for refill request on the following medications:  fluticasone (FLONASE) 50 MCG/ACT nasal spray   [facility]  YD#[phone]

## 2017-02-09 ENCOUNTER — Other Ambulatory Visit: Payer: Self-pay | Admitting: Family Medicine

## 2017-02-09 DIAGNOSIS — I10 Essential (primary) hypertension: Secondary | ICD-10-CM

## 2017-04-15 ENCOUNTER — Encounter: Payer: Self-pay | Admitting: Family Medicine

## 2017-04-15 ENCOUNTER — Ambulatory Visit (INDEPENDENT_AMBULATORY_CARE_PROVIDER_SITE_OTHER): Payer: Medicare Other | Admitting: Family Medicine

## 2017-04-15 VITALS — BP 142/68 | HR 62 | Temp 98.4°F | Resp 16 | Wt 178.0 lb

## 2017-04-15 DIAGNOSIS — E7800 Pure hypercholesterolemia, unspecified: Secondary | ICD-10-CM | POA: Diagnosis not present

## 2017-04-15 DIAGNOSIS — I10 Essential (primary) hypertension: Secondary | ICD-10-CM | POA: Diagnosis not present

## 2017-04-15 DIAGNOSIS — E119 Type 2 diabetes mellitus without complications: Secondary | ICD-10-CM

## 2017-04-15 LAB — POCT GLYCOSYLATED HEMOGLOBIN (HGB A1C): HEMOGLOBIN A1C: 7

## 2017-04-15 NOTE — Progress Notes (Signed)
Patient: Valerie Foster Female    DOB: 11-Feb-1934   84 y.o.   MRN: [id number] Visit Date: 04/15/2017  Today's Provider: [name], MD   Chief Complaint  Patient presents with  . Diabetes  . Hypertension   Subjective:    HPI  Diabetes Mellitus Type II, Follow-up:   Lab Results  Component Value Date   HGBA1C 7.0 04/15/2017   HGBA1C 7.0 12/23/2016   HGBA1C 6.8 08/05/2016    Last seen for diabetes 4 months ago.  Management since then includes none. She reports good compliance with treatment. She is not having side effects.  Home blood sugar records: 140-150's   Pertinent Labs:    Component Value Date/Time   CHOL 253 (H) 04/09/2016 0924   TRIG 266 (H) 04/09/2016 0924   HDL 46 04/09/2016 0924   LDLCALC 154 (H) 04/09/2016 0924   CREATININE 1.02 (H) 04/09/2016 0924   CREATININE 1.13 06/18/2012 0206    Wt Readings from Last 3 Encounters:  04/15/17 178 lb (80.7 kg)  12/23/16 176 lb (79.8 kg)  08/05/16 177 lb 9.6 oz (80.6 kg)   ------------------------------------------------------------------------  Hypertension, follow-up:  BP Readings from Last 3 Encounters:  04/15/17 (!) 142/68  12/23/16 128/64  08/05/16 116/62    She was last seen for hypertension 4 months ago.  BP at that visit was none. Management since that visit includes none. She reports good compliance with treatment. She is not having side effects.    Outside blood pressures are 140's/60's..  Patient denies chest pain, chest pressure/discomfort, claudication, dyspnea, exertional chest pressure/discomfort, fatigue, irregular heart beat, lower extremity edema, near-syncope, orthopnea, palpitations, paroxysmal nocturnal dyspnea, syncope and tachypnea.    Wt Readings from Last 3 Encounters:  04/15/17 178 lb (80.7 kg)  12/23/16 176 lb (79.8 kg)  08/05/16 177 lb 9.6 oz (80.6 kg)   ------------------------------------------------------------------------    Allergies  Allergen Reactions   . Statins     Joint and muscle pain severe  . Sulfa Antibiotics      Current Outpatient Medications:  .  aspirin 81 MG tablet, Take by mouth., Disp: , Rfl:  .  cetirizine (ZYRTEC) 10 MG tablet, Take 1 tablet (10 mg total) by mouth daily. (Patient taking differently: Take 10 mg by mouth daily. ), Disp: , Rfl:  .  cloNIDine (CATAPRES) 0.1 MG tablet, Take 1 tablet (0.1 mg total) by mouth 3 (three) times daily. (Patient taking differently: Take 0.1 mg by mouth 3 (three) times daily. ), Disp: 270 tablet, Rfl: 3 .  cyclobenzaprine (FLEXERIL) 10 MG tablet, Take 1 tablet (10 mg total) by mouth at bedtime., Disp: 30 tablet, Rfl: 5 .  diltiazem (DILACOR XR) 120 MG 24 hr capsule, Take 1 capsule (120 mg total) by mouth daily., Disp: 90 capsule, Rfl: 3 .  ezetimibe (ZETIA) 10 MG tablet, Take by mouth., Disp: , Rfl:  .  fluticasone (FLONASE) 50 MCG/ACT nasal spray, Place 2 sprays daily into both nostrils., Disp: 16 g, Rfl: 12 .  hydrochlorothiazide (HYDRODIURIL) 25 MG tablet, TAKE 1 TABLET EVERY DAY, Disp: 90 tablet, Rfl: 3 .  LORazepam (ATIVAN) 0.5 MG tablet, TAKE 1 TABLET BY MOUTH TWICE A DAY AS NEEDED FOR ANXIETY, Disp: 60 tablet, Rfl: 3 .  meloxicam (MOBIC) 7.5 MG tablet, Take 7.5 mg by mouth daily. , Disp: , Rfl:  .  metFORMIN (GLUCOPHAGE) 500 MG tablet, TAKE 1 TABLET EVERY DAY, Disp: 90 tablet, Rfl: 3 .  metoprolol succinate (TOPROL-XL) 50 MG 24  hr tablet, TAKE 1 TABLET EVERY DAY, Disp: 90 tablet, Rfl: 3 .  olmesartan (BENICAR) 40 MG tablet, TAKE 1 TABLET (40 MG TOTAL) BY MOUTH DAILY., Disp: 30 tablet, Rfl: 12 .  pravastatin (PRAVACHOL) 10 MG tablet, Take by mouth., Disp: , Rfl:  .  sodium chloride (OCEAN) 0.65 % SOLN nasal spray, Place 2 sprays into both nostrils every 2 (two) hours while awake., Disp: , Rfl: 0 .  tolterodine (DETROL LA) 4 MG 24 hr capsule, Take 1 capsule (4 mg total) by mouth daily. (Patient not taking: Reported on 12/23/2016), Disp: 30 capsule, Rfl: 3  Review of Systems    Constitutional: Negative.   HENT: Negative.   Eyes: Negative.   Respiratory: Negative.   Cardiovascular: Negative.   Gastrointestinal: Negative.   Endocrine: Negative.   Genitourinary: Negative.   Musculoskeletal: Negative.   Skin: Negative.   Allergic/Immunologic: Negative.   Neurological: Negative.   Hematological: Negative.   Psychiatric/Behavioral: Negative.     Social History   Tobacco Use  . Smoking status: Never Smoker  . Smokeless tobacco: Never Used  Substance Use Topics  . Alcohol use: No   Objective:   BP (!) 142/68 (BP Location: Left Arm, Patient Position: Sitting, Cuff Size: Normal)   Pulse 62   Temp 98.4 F (36.9 C) (Oral)   Resp 16   Wt 178 lb (80.7 kg)   BMI 27.88 kg/m  Vitals:   04/15/17 1518  BP: (!) 142/68  Pulse: 62  Resp: 16  Temp: 98.4 F (36.9 C)  TempSrc: Oral  Weight: 178 lb (80.7 kg)     Physical Exam  Constitutional: She is oriented to person, place, and time. She appears well-developed and well-nourished.  HENT:  Head: Normocephalic and atraumatic.  Eyes: Conjunctivae and EOM are normal. Pupils are equal, round, and reactive to light.  Neck: Normal range of motion. Neck supple.  Cardiovascular: Normal rate, regular rhythm, normal heart sounds and intact distal pulses.  Pulmonary/Chest: Effort normal and breath sounds normal.  Musculoskeletal: Normal range of motion.  Neurological: She is alert and oriented to person, place, and time. She has normal reflexes.  Skin: Skin is warm and dry.  Psychiatric: She has a normal mood and affect. Her behavior is normal. Judgment and thought content normal.        Assessment & Plan:     1. Essential hypertension Stable.  - CBC with Differential/Platelet - TSH  2. Type 2 diabetes mellitus without complication, without long-term current use of insulin (HCC)  - POCT HgB A1C 7.0 today. Stable. Follow up in 4 months.   3. Hypercholesteremia  - Lipid panel - Comprehensive metabolic  panel     HPI, Exam, and A&P Transcribed under the direction and in the presence of Lorenso Quirino L. Cranford Mon, MD  Electronically Signed: Katina Dung, CMA   I have done the exam and reviewed the above chart and it is accurate to the best of my knowledge. Development worker, community has been used in this note in any air is in the dictation or transcription are unintentional.  [name], MD  [place]

## 2017-04-16 DIAGNOSIS — I10 Essential (primary) hypertension: Secondary | ICD-10-CM | POA: Diagnosis not present

## 2017-04-16 DIAGNOSIS — E7800 Pure hypercholesterolemia, unspecified: Secondary | ICD-10-CM | POA: Diagnosis not present

## 2017-04-17 LAB — CBC WITH DIFFERENTIAL/PLATELET
BASOS ABS: 0 10*3/uL (ref 0.0–0.2)
BASOS: 1 %
EOS (ABSOLUTE): 0.1 10*3/uL (ref 0.0–0.4)
Eos: 2 %
Hematocrit: 40.5 % (ref 34.0–46.6)
Hemoglobin: 13.1 g/dL (ref 11.1–15.9)
IMMATURE GRANS (ABS): 0 10*3/uL (ref 0.0–0.1)
Immature Granulocytes: 0 %
LYMPHS: 20 %
Lymphocytes Absolute: 1.1 10*3/uL (ref 0.7–3.1)
MCH: 27.4 pg (ref 26.6–33.0)
MCHC: 32.3 g/dL (ref 31.5–35.7)
MCV: 85 fL (ref 79–97)
Monocytes Absolute: 0.4 10*3/uL (ref 0.1–0.9)
Monocytes: 6 %
NEUTROS ABS: 4.1 10*3/uL (ref 1.4–7.0)
NEUTROS PCT: 71 %
PLATELETS: 251 10*3/uL (ref 150–379)
RBC: 4.78 x10E6/uL (ref 3.77–5.28)
RDW: 14.6 % (ref 12.3–15.4)
WBC: 5.8 10*3/uL (ref 3.4–10.8)

## 2017-04-17 LAB — COMPREHENSIVE METABOLIC PANEL
ALT: 18 IU/L (ref 0–32)
AST: 20 IU/L (ref 0–40)
Albumin/Globulin Ratio: 1.7 (ref 1.2–2.2)
Albumin: 4.4 g/dL (ref 3.5–4.7)
Alkaline Phosphatase: 66 IU/L (ref 39–117)
BUN/Creatinine Ratio: 13 (ref 12–28)
BUN: 18 mg/dL (ref 8–27)
Bilirubin Total: 0.4 mg/dL (ref 0.0–1.2)
CO2: 23 mmol/L (ref 20–29)
Calcium: 9.9 mg/dL (ref 8.7–10.3)
Chloride: 103 mmol/L (ref 96–106)
Creatinine, Ser: 1.37 mg/dL — ABNORMAL HIGH (ref 0.57–1.00)
GFR calc Af Amer: 41 mL/min/{1.73_m2} — ABNORMAL LOW (ref >59)
GFR calc non Af Amer: 36 mL/min/{1.73_m2} — ABNORMAL LOW (ref >59)
Globulin, Total: 2.6 g/dL (ref 1.5–4.5)
Glucose: 131 mg/dL — ABNORMAL HIGH (ref 65–99)
Potassium: 4.5 mmol/L (ref 3.5–5.2)
Sodium: 142 mmol/L (ref 134–144)
Total Protein: 7 g/dL (ref 6.0–8.5)

## 2017-04-17 LAB — LIPID PANEL
CHOLESTEROL TOTAL: 217 mg/dL — AB (ref 100–199)
Chol/HDL Ratio: 3.8 ratio (ref 0.0–4.4)
HDL: 57 mg/dL (ref >39)
LDL CALC: 127 mg/dL — AB (ref 0–99)
Triglycerides: 165 mg/dL — ABNORMAL HIGH (ref 0–149)
VLDL CHOLESTEROL CAL: 33 mg/dL (ref 5–40)

## 2017-04-17 LAB — TSH: TSH: 4.7 u[IU]/mL — ABNORMAL HIGH (ref 0.450–4.500)

## 2017-04-19 ENCOUNTER — Ambulatory Visit: Payer: Medicare Other | Admitting: Family Medicine

## 2017-04-22 ENCOUNTER — Ambulatory Visit: Payer: Medicare Other | Admitting: Family Medicine

## 2017-05-24 DEATH — deceased

## 2017-06-06 ENCOUNTER — Other Ambulatory Visit: Payer: Self-pay | Admitting: Family Medicine

## 2017-06-10 DIAGNOSIS — E119 Type 2 diabetes mellitus without complications: Secondary | ICD-10-CM | POA: Diagnosis not present

## 2017-06-10 LAB — HM DIABETES EYE EXAM

## 2017-06-21 ENCOUNTER — Other Ambulatory Visit: Payer: Self-pay | Admitting: Family Medicine

## 2017-06-21 DIAGNOSIS — F419 Anxiety disorder, unspecified: Secondary | ICD-10-CM

## 2017-07-06 DIAGNOSIS — K219 Gastro-esophageal reflux disease without esophagitis: Secondary | ICD-10-CM | POA: Diagnosis not present

## 2017-07-06 DIAGNOSIS — E785 Hyperlipidemia, unspecified: Secondary | ICD-10-CM | POA: Diagnosis not present

## 2017-07-06 DIAGNOSIS — R000 Tachycardia, unspecified: Secondary | ICD-10-CM | POA: Diagnosis not present

## 2017-07-06 DIAGNOSIS — R0602 Shortness of breath: Secondary | ICD-10-CM | POA: Diagnosis not present

## 2017-07-06 DIAGNOSIS — I10 Essential (primary) hypertension: Secondary | ICD-10-CM | POA: Diagnosis not present

## 2017-07-06 DIAGNOSIS — E7800 Pure hypercholesterolemia, unspecified: Secondary | ICD-10-CM | POA: Diagnosis not present

## 2017-07-06 DIAGNOSIS — R42 Dizziness and giddiness: Secondary | ICD-10-CM | POA: Diagnosis not present

## 2017-07-06 DIAGNOSIS — I2510 Atherosclerotic heart disease of native coronary artery without angina pectoris: Secondary | ICD-10-CM | POA: Diagnosis not present

## 2017-07-06 DIAGNOSIS — I209 Angina pectoris, unspecified: Secondary | ICD-10-CM | POA: Diagnosis not present

## 2017-07-06 DIAGNOSIS — E7849 Other hyperlipidemia: Secondary | ICD-10-CM | POA: Diagnosis not present

## 2017-07-06 DIAGNOSIS — I208 Other forms of angina pectoris: Secondary | ICD-10-CM | POA: Diagnosis not present

## 2017-07-06 DIAGNOSIS — F419 Anxiety disorder, unspecified: Secondary | ICD-10-CM | POA: Diagnosis not present

## 2017-08-12 ENCOUNTER — Encounter: Payer: Self-pay | Admitting: Family Medicine

## 2017-08-12 ENCOUNTER — Ambulatory Visit (INDEPENDENT_AMBULATORY_CARE_PROVIDER_SITE_OTHER): Payer: Medicare Other | Admitting: Family Medicine

## 2017-08-12 VITALS — BP 122/60 | HR 60 | Temp 98.6°F | Resp 16 | Ht 67.0 in | Wt 175.0 lb

## 2017-08-12 DIAGNOSIS — I10 Essential (primary) hypertension: Secondary | ICD-10-CM | POA: Diagnosis not present

## 2017-08-12 DIAGNOSIS — E119 Type 2 diabetes mellitus without complications: Secondary | ICD-10-CM

## 2017-08-12 NOTE — Progress Notes (Signed)
Patient: Valerie Foster Female    DOB: 1933/02/26   84 y.o.   MRN: [id number] Visit Date: 08/12/2017  Today's Provider: [name], MD   Chief Complaint  Patient presents with  . Diabetes  . Hypertension  . Hyperlipidemia   Subjective:    HPI  Pt recently widowed,coping pretty well.  Diabetes Mellitus Type II, Follow-up:   Lab Results  Component Value Date   HGBA1C 7.0 04/15/2017   HGBA1C 7.0 12/23/2016   HGBA1C 6.8 08/05/2016    Last seen for diabetes 4 months ago.  Management since then includes no changes. She reports good compliance with treatment. She is not having side effects.  Current symptoms include none and have been stable. Home blood sugar records: trend: increasing steadily  Episodes of hypoglycemia? no   Most Recent Eye Exam: up to date Weight trend: stable Prior visit with dietician: no Current diet: well balanced Current exercise: no regular exercise  Pertinent Labs:    Component Value Date/Time   CHOL 217 (H) 04/16/2017 0840   TRIG 165 (H) 04/16/2017 0840   HDL 57 04/16/2017 0840   LDLCALC 127 (H) 04/16/2017 0840   CREATININE 1.37 (H) 04/16/2017 0840   CREATININE 1.13 06/18/2012 0206    Wt Readings from Last 3 Encounters:  08/12/17 175 lb (79.4 kg)  04/15/17 178 lb (80.7 kg)  12/23/16 176 lb (79.8 kg)       Hypertension, follow-up:  BP Readings from Last 3 Encounters:  08/12/17 122/60  04/15/17 (!) 142/68  12/23/16 128/64    She was last seen for hypertension 4 months ago.  BP at that visit was 142/68. Management since that visit includes no changes. She reports good compliance with treatment. She is not having side effects.  She is not exercising. She is adherent to low salt diet.   Outside blood pressures are checked occasionally. She is experiencing lower extremity edema.    Allergies  Allergen Reactions  . Statins     Joint and muscle pain severe  . Sulfa Antibiotics      Current Outpatient  Medications:  .  aspirin 81 MG tablet, Take by mouth., Disp: , Rfl:  .  cetirizine (ZYRTEC) 10 MG tablet, Take 1 tablet (10 mg total) by mouth daily. (Patient taking differently: Take 10 mg by mouth daily. ), Disp: , Rfl:  .  cloNIDine (CATAPRES) 0.1 MG tablet, TAKE 1 TABLET (0.1 MG TOTAL) BY MOUTH 3 (THREE) TIMES DAILY., Disp: 270 tablet, Rfl: 3 .  cyclobenzaprine (FLEXERIL) 10 MG tablet, Take 1 tablet (10 mg total) by mouth at bedtime., Disp: 30 tablet, Rfl: 5 .  diltiazem (DILACOR XR) 120 MG 24 hr capsule, Take 1 capsule (120 mg total) by mouth daily., Disp: 90 capsule, Rfl: 3 .  ezetimibe (ZETIA) 10 MG tablet, Take by mouth., Disp: , Rfl:  .  fluticasone (FLONASE) 50 MCG/ACT nasal spray, Place 2 sprays daily into both nostrils., Disp: 16 g, Rfl: 12 .  hydrochlorothiazide (HYDRODIURIL) 25 MG tablet, TAKE 1 TABLET EVERY DAY, Disp: 90 tablet, Rfl: 3 .  LORazepam (ATIVAN) 0.5 MG tablet, TAKE 1 TABLET BY MOUTH TWICE A DAY AS NEEDED FOR ANXIETY, Disp: 60 tablet, Rfl: 4 .  meloxicam (MOBIC) 7.5 MG tablet, Take 7.5 mg by mouth daily. , Disp: , Rfl:  .  metFORMIN (GLUCOPHAGE) 500 MG tablet, TAKE 1 TABLET EVERY DAY, Disp: 90 tablet, Rfl: 3 .  metoprolol succinate (TOPROL-XL) 50 MG 24 hr tablet, TAKE 1  TABLET EVERY DAY, Disp: 90 tablet, Rfl: 3 .  olmesartan (BENICAR) 40 MG tablet, TAKE 1 TABLET (40 MG TOTAL) BY MOUTH DAILY., Disp: 30 tablet, Rfl: 12 .  pravastatin (PRAVACHOL) 10 MG tablet, Take by mouth., Disp: , Rfl:  .  sodium chloride (OCEAN) 0.65 % SOLN nasal spray, Place 2 sprays into both nostrils every 2 (two) hours while awake., Disp: , Rfl: 0 .  tolterodine (DETROL LA) 4 MG 24 hr capsule, Take 1 capsule (4 mg total) by mouth daily. (Patient not taking: Reported on 12/23/2016), Disp: 30 capsule, Rfl: 3  Review of Systems  Constitutional: Negative.   Respiratory: Negative.   Cardiovascular: Negative.   Endocrine: Negative.   Musculoskeletal: Negative.   Psychiatric/Behavioral: Negative.      Social History   Tobacco Use  . Smoking status: Never Smoker  . Smokeless tobacco: Never Used  Substance Use Topics  . Alcohol use: No   Objective:   BP 122/60 (BP Location: Right Arm, Patient Position: Sitting, Cuff Size: Normal)   Pulse 60   Temp 98.6 F (37 C)   Resp 16   Ht 5\' 7"  (1.702 m)   Wt 175 lb (79.4 kg)   SpO2 98%   BMI 27.41 kg/m  Vitals:   08/12/17 1502  BP: 122/60  Pulse: 60  Resp: 16  Temp: 98.6 F (37 C)  SpO2: 98%  Weight: 175 lb (79.4 kg)  Height: 5\' 7"  (1.702 m)     Physical Exam  Constitutional: She is oriented to person, place, and time. She appears well-developed and well-nourished.  HENT:  Head: Normocephalic and atraumatic.  Right Ear: External ear normal.  Left Ear: External ear normal.  Nose: Nose normal.  Eyes: Conjunctivae are normal. No scleral icterus.  Neck: No thyromegaly present.  Cardiovascular: Normal rate, regular rhythm and normal heart sounds.  Pulmonary/Chest: Effort normal and breath sounds normal.  Abdominal: Soft.  Musculoskeletal: She exhibits no edema.  Neurological: She is alert and oriented to person, place, and time.  Skin: Skin is warm and dry.  Psychiatric: She has a normal mood and affect. Her behavior is normal. Judgment and thought content normal.        Assessment & Plan:     1. Type 2 diabetes mellitus without complication, without long-term current use of insulin (HCC)  - Hemoglobin A1c--7.1  2. Essential hypertension  - Renal function panel - TSH 3.CAD 4.HLD 5.Grieving  Recent loss of husband.      Richard Cranford Mon, MD  [facility]

## 2017-08-13 LAB — RENAL FUNCTION PANEL
Albumin: 4.2 g/dL (ref 3.5–4.7)
BUN/Creatinine Ratio: 13 (ref 12–28)
BUN: 17 mg/dL (ref 8–27)
CO2: 25 mmol/L (ref 20–29)
Calcium: 9.9 mg/dL (ref 8.7–10.3)
Chloride: 102 mmol/L (ref 96–106)
Creatinine, Ser: 1.31 mg/dL — ABNORMAL HIGH (ref 0.57–1.00)
GFR calc non Af Amer: 38 mL/min/{1.73_m2} — ABNORMAL LOW (ref >59)
GFR, EST AFRICAN AMERICAN: 44 mL/min/{1.73_m2} — AB (ref >59)
Glucose: 76 mg/dL (ref 65–99)
Phosphorus: 3.5 mg/dL (ref 2.5–4.5)
Potassium: 4.2 mmol/L (ref 3.5–5.2)
Sodium: 139 mmol/L (ref 134–144)

## 2017-08-13 LAB — TSH: TSH: 2.91 u[IU]/mL (ref 0.450–4.500)

## 2017-08-13 LAB — HEMOGLOBIN A1C
Est. average glucose Bld gHb Est-mCnc: 157 mg/dL
Hgb A1c MFr Bld: 7.1 % — ABNORMAL HIGH (ref 4.8–5.6)

## 2017-09-23 ENCOUNTER — Other Ambulatory Visit: Payer: Self-pay | Admitting: Family Medicine

## 2017-10-22 IMAGING — MR MR CERVICAL SPINE W/O CM
5 series · 34 of 48 positions shown · non-contrast
Comparison: Plain film cervical spine 05/07/2015

CLINICAL DATA: Pain in the right side of the neck radiating into
the head and resulting in a headache for 2-3 months. Neck stiffness
and right shoulder pain. No known injury. Initial encounter.

EXAM:
MRI CERVICAL SPINE WITHOUT CONTRAST
TECHNIQUE: Multiplanar, multisequence MR imaging of the cervical spine was
performed. No intravenous contrast was administered.

[Series 2: T2 · sagittal · 3.0mm · 0.56mm/px · 8 of 13 slices shown (1 of 2)]
[im 1/13]
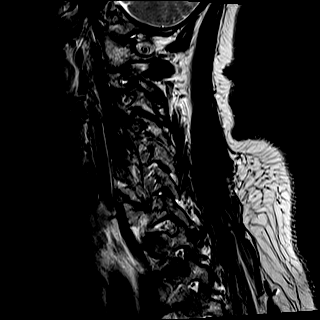
[im 2/13]
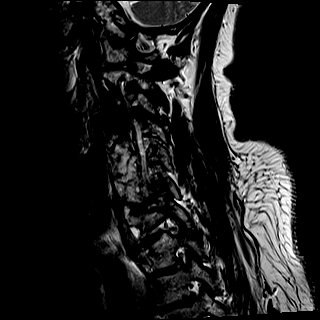
[im 4/13]
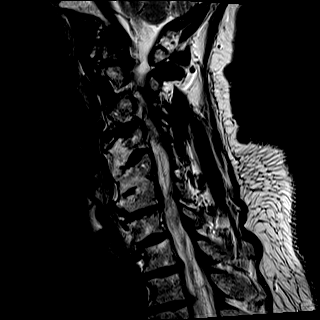
[im 6/13]
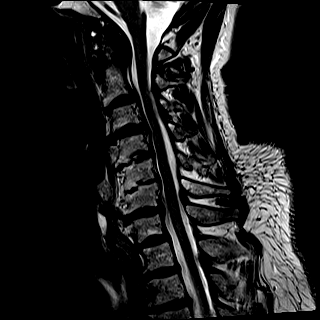
[im 7/13]
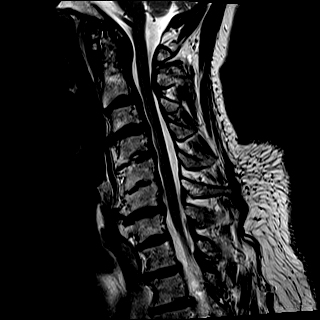
[im 9/13]
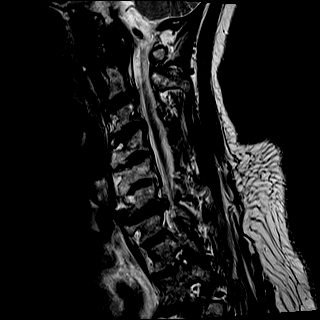
[im 11/13]
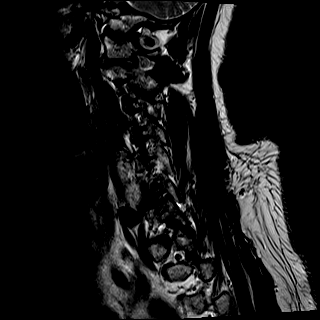
[im 13/13]
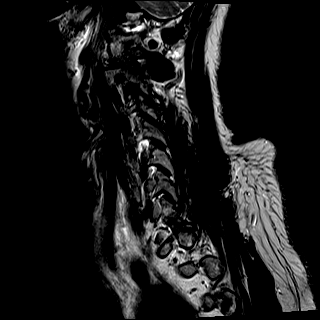

[Series 3: T1 · sagittal · 3.0mm · 0.70mm/px · 7 of 13 slices shown]
[im 1/13]
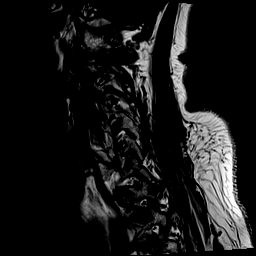
[im 3/13]
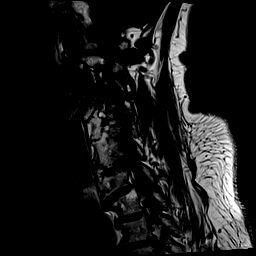
[im 5/13]
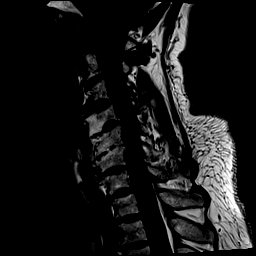
[im 7/13]
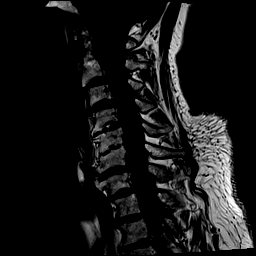
[im 9/13]
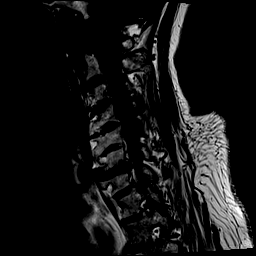
[im 11/13]
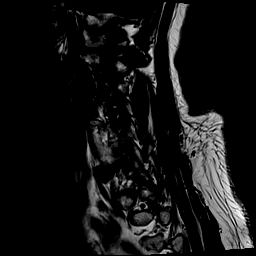
[im 13/13]
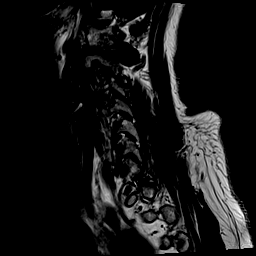

[Series 4: STIR · sagittal · 3.0mm · 0.35mm/px · 7 of 13 slices shown]
[im 1/13]
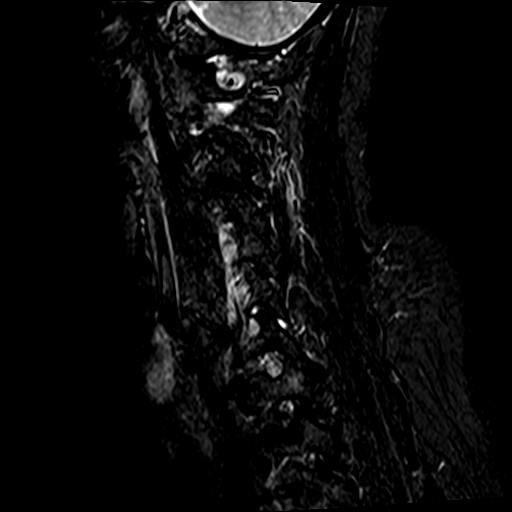
[im 3/13]
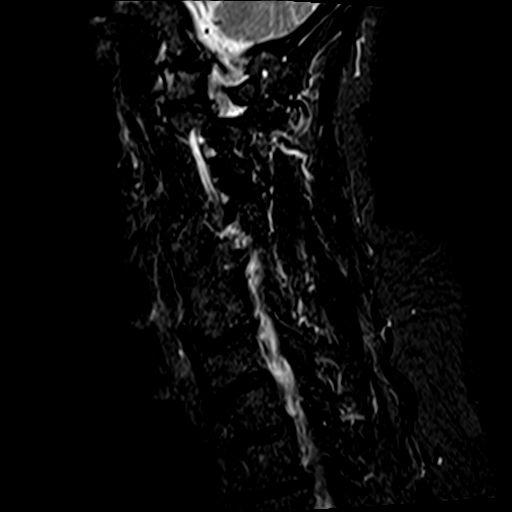
[im 5/13]
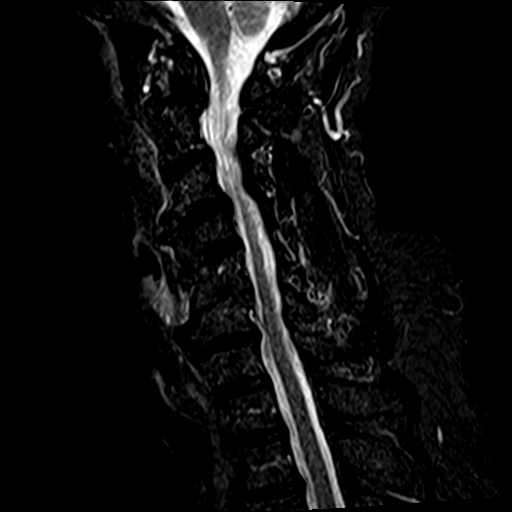
[im 7/13]
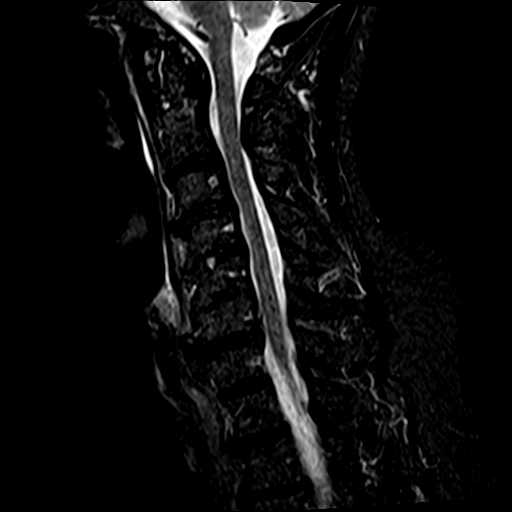
[im 9/13]
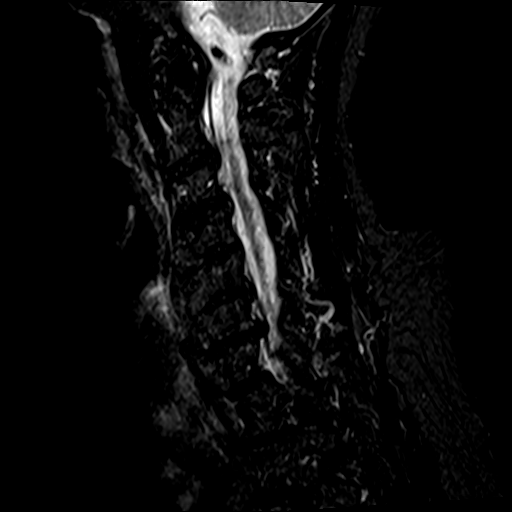
[im 11/13]
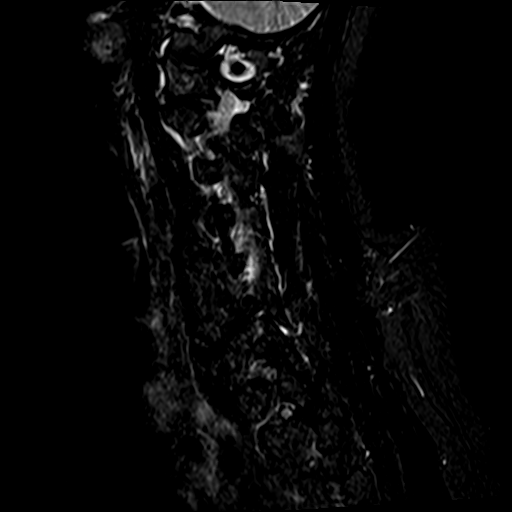
[im 13/13]
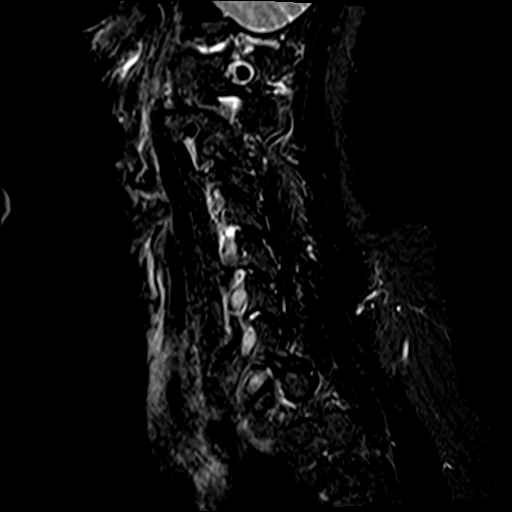

[Series 5: T2 · axial · 3.0mm · 0.62mm/px · z∈[-76,+14]mm · 9 of 25 slices shown (2 of 2)]
[im 1/25]
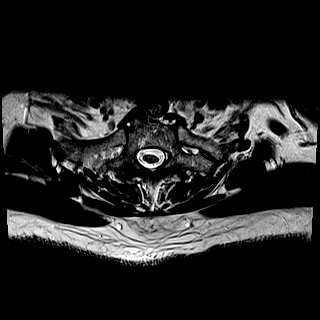
[im 5/25]
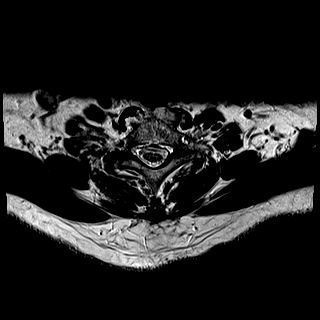
[im 9/25]
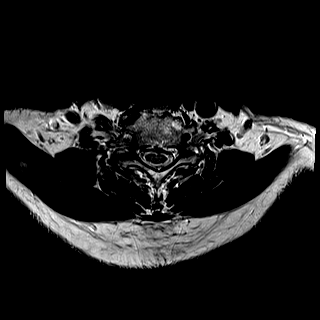
[im 11/25]
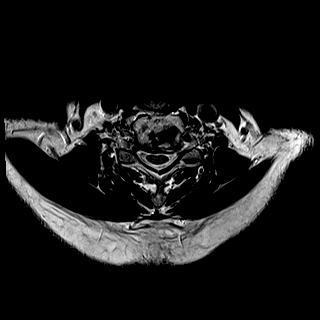
[im 13/25]
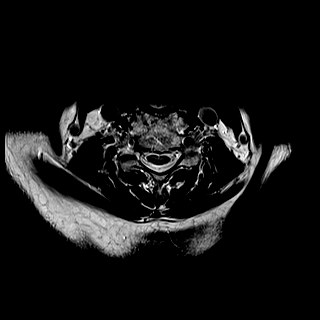
[im 15/25]
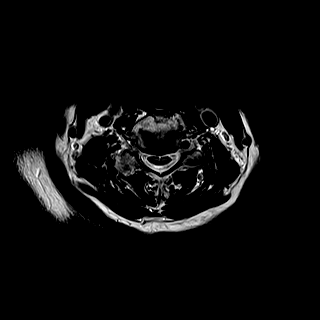
[im 17/25]
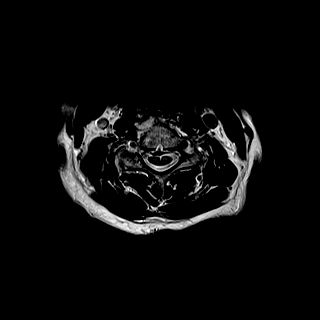
[im 21/25]
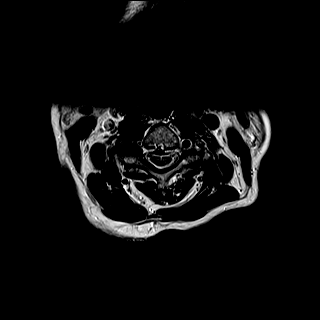
[im 25/25]
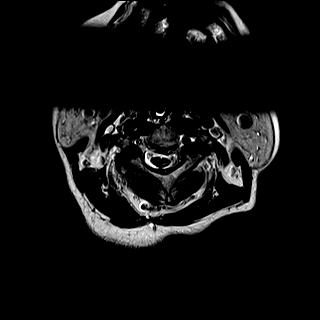

[Series 6: mpgr ax · axial · 3.0mm · 0.35mm/px · z∈[-67,-37]mm · 3 of 25 slices shown]
[im 1/25]
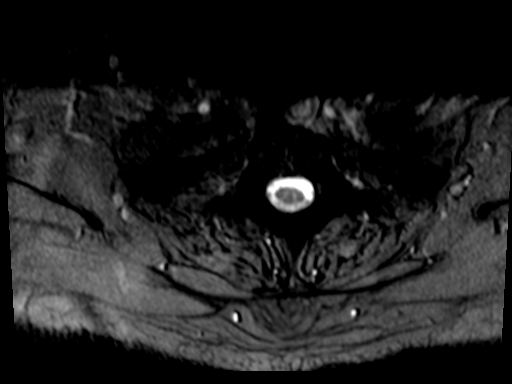
[im 5/25]
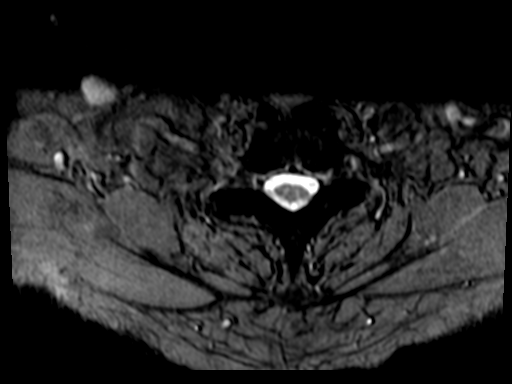
[im 9/25]
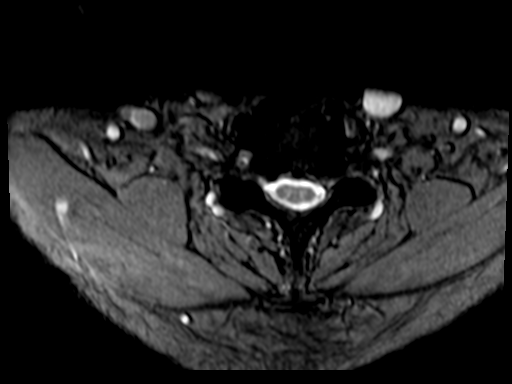

[34 of 48 positions shown; findings below may reference images not displayed]

FINDINGS: Alignment: 0.2 cm anterolisthesis C2 on C3 and trace anterolisthesis
C3 on C4 is due to facet arthropathy. Alignment is otherwise
maintained with straightening of the normal cervical lordosis noted.

Vertebrae: Vertebral body height is maintained. No worrisome marrow
lesion.

Cord: Demonstrates normal signal throughout.

Posterior Fossa, vertebral arteries, paraspinal tissues:
Unremarkable.

Disc levels: C2-3: Left worse than right facet degenerative disease
is identified. Shallow broad-based left paracentral protrusion is
seen. The ventral thecal sac is effaced. The right foramen is open.
Moderately severe left foraminal narrowing is identified.

C3-4: Very shallow disc bulge is seen and there is some facet
degenerative disease which is more notable on the right. The ventral
thecal sac is effaced. The left foramen is open. Moderate right
foraminal narrowing is identified.

C4-5: Mild posterior bony ridging without central canal or foraminal
stenosis.

C5-6: There is partial autologous fusion across the disc interspace.
Minimal posterior bony ridging without central canal or foraminal
stenosis.

C6-7: There is a shallow disc bulge and left much worse than right
uncovertebral disease. The ventral thecal sac is narrowed but not
effaced. Severe left and mild to moderate right foraminal narrowing
is identified.

C7-T1: Facet degenerative change. No disc bulge or protrusion. The
central canal and foramina are open.
IMPRESSION: No finding to explain the patient's right-sided neck pain.

Multilevel degenerative change most notable at C2-3, C3-4 and C6-7
as described above.

## 2018-01-04 ENCOUNTER — Ambulatory Visit (INDEPENDENT_AMBULATORY_CARE_PROVIDER_SITE_OTHER): Payer: Medicare Other | Admitting: Family Medicine

## 2018-01-04 ENCOUNTER — Ambulatory Visit (INDEPENDENT_AMBULATORY_CARE_PROVIDER_SITE_OTHER): Payer: Medicare Other

## 2018-01-04 VITALS — BP 132/66 | HR 64 | Temp 98.9°F | Resp 16 | Wt 175.2 lb

## 2018-01-04 VITALS — BP 132/66 | HR 64 | Temp 98.9°F | Ht 67.0 in | Wt 175.2 lb

## 2018-01-04 DIAGNOSIS — Z0000 Encounter for general adult medical examination without abnormal findings: Secondary | ICD-10-CM | POA: Diagnosis not present

## 2018-01-04 DIAGNOSIS — R109 Unspecified abdominal pain: Secondary | ICD-10-CM

## 2018-01-04 DIAGNOSIS — Z23 Encounter for immunization: Secondary | ICD-10-CM | POA: Diagnosis not present

## 2018-01-04 DIAGNOSIS — E7800 Pure hypercholesterolemia, unspecified: Secondary | ICD-10-CM | POA: Diagnosis not present

## 2018-01-04 DIAGNOSIS — E119 Type 2 diabetes mellitus without complications: Secondary | ICD-10-CM | POA: Diagnosis not present

## 2018-01-04 DIAGNOSIS — I10 Essential (primary) hypertension: Secondary | ICD-10-CM | POA: Diagnosis not present

## 2018-01-04 DIAGNOSIS — Z13820 Encounter for screening for osteoporosis: Secondary | ICD-10-CM | POA: Diagnosis not present

## 2018-01-04 DIAGNOSIS — E2839 Other primary ovarian failure: Secondary | ICD-10-CM

## 2018-01-04 LAB — POCT URINALYSIS DIPSTICK
Bilirubin, UA: NEGATIVE
Glucose, UA: NEGATIVE
Ketones, UA: NEGATIVE
LEUKOCYTES UA: NEGATIVE
NITRITE UA: NEGATIVE
PH UA: 6 (ref 5.0–8.0)
PROTEIN UA: NEGATIVE
RBC UA: NEGATIVE
Spec Grav, UA: 1.02 (ref 1.010–1.025)
UROBILINOGEN UA: 0.2 U/dL

## 2018-01-04 MED ORDER — GLUCOSE BLOOD VI STRP: ORAL_STRIP | 12 refills | Status: AC

## 2018-01-04 NOTE — Progress Notes (Signed)
Patient: Valerie Foster Female    DOB: April 27, 1933   84 y.o.   MRN: [id number] Visit Date: 01/04/2018  Today's Provider: [name], MD   Chief Complaint  Patient presents with  . Hypertension  . Diabetes  . Hyperlipidemia   Subjective:   Patient saw Valerie Foster for AWE today.  She is doing ok--her husband died earlier this year and she is at peace. FH includes a sister who ided from colon cancer at 9. HPI  Hypertension, follow-up:  BP Readings from Last 3 Encounters:  01/04/18 132/66  01/04/18 132/66  08/12/17 122/60    She was last seen for hypertension 5 months ago.  BP at that visit was 122/60. Management since that visit includes no changes. She reports good compliance with treatment. She is not having side effects.  She is not exercising, but she does stay active.  She is adherent to low salt diet.   Outside blood pressures are checked occasionally. She is experiencing none.  Patient denies exertional chest pressure/discomfort, lower extremity edema and palpitations.   Cardiovascular risk factors include diabetes mellitus and dyslipidemia.   Weight trend: stable Wt Readings from Last 3 Encounters:  01/04/18 175 lb 3.2 oz (79.5 kg)  01/04/18 175 lb 3.2 oz (79.5 kg)  08/12/17 175 lb (79.4 kg)    Current diet: well balanced      Diabetes Mellitus Type II, Follow-up:   Lab Results  Component Value Date   HGBA1C 7.1 (H) 08/12/2017   HGBA1C 7.0 04/15/2017   HGBA1C 7.0 12/23/2016    Last seen for diabetes 5 months ago.  Management since then includes no changes. She reports good compliance with treatment. She is not having side effects.  Current symptoms include none and have been stable. Home blood sugar records: trend: stable  Episodes of hypoglycemia? no   Current Insulin Regimen: none Most Recent Eye Exam: 05/2017 Weight trend: stable Prior visit with dietician: no Current diet: well balanced Current exercise: no regular  exercise  Pertinent Labs:    Component Value Date/Time   CHOL 217 (H) 04/16/2017 0840   TRIG 165 (H) 04/16/2017 0840   HDL 57 04/16/2017 0840   LDLCALC 127 (H) 04/16/2017 0840   CREATININE 1.31 (H) 08/12/2017 1604   CREATININE 1.13 06/18/2012 0206    Allergies  Allergen Reactions  . Statins     Joint and muscle pain severe  . Sulfa Antibiotics      Current Outpatient Medications:  .  aspirin 81 MG tablet, Take 81 mg by mouth daily. , Disp: , Rfl:  .  cetirizine (ZYRTEC) 10 MG tablet, Take 1 tablet (10 mg total) by mouth daily. (Patient taking differently: Take 10 mg by mouth daily. ), Disp: , Rfl:  .  cloNIDine (CATAPRES) 0.1 MG tablet, TAKE 1 TABLET (0.1 MG TOTAL) BY MOUTH 3 (THREE) TIMES DAILY., Disp: 270 tablet, Rfl: 3 .  cyclobenzaprine (FLEXERIL) 10 MG tablet, Take 1 tablet (10 mg total) by mouth at bedtime. (Patient taking differently: Take 10 mg by mouth at bedtime. ), Disp: 30 tablet, Rfl: 5 .  diltiazem (DILACOR XR) 120 MG 24 hr capsule, Take 1 capsule (120 mg total) by mouth daily., Disp: 90 capsule, Rfl: 3 .  ezetimibe (ZETIA) 10 MG tablet, Take 10 mg by mouth daily. , Disp: , Rfl:  .  fluticasone (FLONASE) 50 MCG/ACT nasal spray, Place 2 sprays daily into both nostrils. (Patient taking differently: Place 2 sprays into both nostrils daily. ),  Disp: 16 g, Rfl: 12 .  hydrochlorothiazide (HYDRODIURIL) 25 MG tablet, TAKE 1 TABLET EVERY DAY, Disp: 90 tablet, Rfl: 3 .  LORazepam (ATIVAN) 0.5 MG tablet, TAKE 1 TABLET BY MOUTH TWICE A DAY AS NEEDED FOR ANXIETY, Disp: 60 tablet, Rfl: 4 .  meloxicam (MOBIC) 7.5 MG tablet, Take 7.5 mg by mouth daily. , Disp: , Rfl:  .  metFORMIN (GLUCOPHAGE) 500 MG tablet, TAKE 1 TABLET EVERY DAY, Disp: 90 tablet, Rfl: 3 .  metoprolol succinate (TOPROL-XL) 50 MG 24 hr tablet, TAKE 1 TABLET BY MOUTH EVERY DAY, Disp: 90 tablet, Rfl: 3 .  olmesartan (BENICAR) 40 MG tablet, TAKE 1 TABLET (40 MG TOTAL) BY MOUTH DAILY., Disp: 30 tablet, Rfl: 12 .   pravastatin (PRAVACHOL) 10 MG tablet, Take by mouth., Disp: , Rfl:  .  sodium chloride (OCEAN) 0.65 % SOLN nasal spray, Place 2 sprays into both nostrils every 2 (two) hours while awake., Disp: , Rfl: 0 .  tolterodine (DETROL LA) 4 MG 24 hr capsule, Take 1 capsule (4 mg total) by mouth daily. (Patient not taking: Reported on 12/23/2016), Disp: 30 capsule, Rfl: 3  Review of Systems  Constitutional: Negative.   HENT: Negative.   Eyes: Negative.   Respiratory: Negative.   Cardiovascular: Negative.   Gastrointestinal: Negative.        Occasional discomfort of right flank. Very mild and very vague.  Endocrine: Negative.   Genitourinary: Negative.   Musculoskeletal: Negative.   Skin: Negative.   Allergic/Immunologic: Negative.   Neurological: Negative.   Hematological: Negative.   Psychiatric/Behavioral: Negative.     Social History   Tobacco Use  . Smoking status: Never Smoker  . Smokeless tobacco: Never Used  Substance Use Topics  . Alcohol use: No   Objective:   BP 132/66   Pulse 64   Temp 98.9 F (37.2 C)   Resp 16   Wt 175 lb 3.2 oz (79.5 kg)   BMI 27.44 kg/m  Vitals:   01/04/18 1425  BP: 132/66  Pulse: 64  Resp: 16  Temp: 98.9 F (37.2 C)  Weight: 175 lb 3.2 oz (79.5 kg)     Physical Exam  Constitutional: She is oriented to person, place, and time. She appears well-developed and well-nourished.  HENT:  Head: Normocephalic and atraumatic.  Right Ear: External ear normal.  Left Ear: External ear normal.  Nose: Nose normal.  Mouth/Throat: Oropharynx is clear and moist.  Eyes: Conjunctivae are normal. No scleral icterus.  Neck: No thyromegaly present.  Cardiovascular: Normal rate, regular rhythm, normal heart sounds and intact distal pulses.  Pulmonary/Chest: Effort normal and breath sounds normal. She exhibits no mass and no tenderness. Right breast exhibits no mass. Left breast exhibits no mass.  Abdominal: Soft.  Musculoskeletal: She exhibits no edema.   Neurological: She is alert and oriented to person, place, and time.  Skin: Skin is warm and dry.  Psychiatric: She has a normal mood and affect. Her behavior is normal. Judgment and thought content normal.        Assessment & Plan:     1. Type 2 diabetes mellitus without complication, without long-term current use of insulin (HCC)  - CBC with Differential/Platelet - Hemoglobin A1c - glucose blood test strip; Use as instructed  Dispense: 100 each; Refill: 12  2. Essential hypertension  - Comprehensive metabolic panel - Lipid panel - TSH  3. Hypercholesteremia   4. Right flank pain/discomfort Pt declines further evaluation for now.  - POCT urinalysis dipstick 5.health  Maintenance Discussed mammogram and possible colonoscopy vs cologard in 84 yo. More than 50% of 71minute visit spent in counseling.RTC 6 months.     I have done the exam and reviewed the above chart and it is accurate to the best of my knowledge. Development worker, community has been used in this note in any air is in the dictation or transcription are unintentional.  [name], MD  [place]

## 2018-01-04 NOTE — Progress Notes (Signed)
Subjective:   Valerie Foster is a 84 y.o. female who presents for Medicare Annual (Subsequent) preventive examination.  Review of Systems:  N/A  Cardiac Risk Factors include: advanced age (>3men, >44 women);diabetes mellitus;hypertension     Objective:     Vitals: BP 132/66 (BP Location: Right Arm)   Pulse 64   Temp 98.9 F (37.2 C) (Oral)   Ht 5\' 7"  (1.702 m)   Wt 175 lb 3.2 oz (79.5 kg)   BMI 27.44 kg/m   Body mass index is 27.44 kg/m.  Advanced Directives 01/04/2018 08/05/2016 05/12/2016 11/14/2015 07/16/2015 03/19/2015 01/28/2015  Does Patient Have a Medical Advance Directive? Yes Yes Yes Yes No No Yes  Type of Advance Directive Living will;Healthcare Power of Attorney Living will;Healthcare Power of [place];Living will Living will;Healthcare Power of [place] will  Does patient want to make changes to medical advance directive? - - - No - Patient declined - - No - Patient declined  Copy of [place] in Chart? No - copy requested No - copy requested - No - copy requested - - No - copy requested  Would patient like information on creating a medical advance directive? - - - - No - patient declined information Yes Higher education careers adviser given -    Tobacco Social History   Tobacco Use  Smoking Status Never Smoker  Smokeless Tobacco Never Used     Counseling given: Not Answered   Clinical Intake:  Pre-visit preparation completed: Yes  Pain : No/denies pain Pain Score: 0-No pain    Nutrition Risk Assessment:  Has the patient had any N/V/D within the last 2 months?  No  Does the patient have any non-healing wounds?  No  Has the patient had any unintentional weight loss or weight gain?  No   Diabetes:  Is the patient diabetic?  Yes , type 2 If diabetic, was a CBG obtained today?  No  Did the patient bring in their glucometer from home?  No  How often do you monitor your CBG's? Daily.   Financial Strains  and Diabetes Management:  Are you having any financial strains with the device, your supplies or your medication? No .  Does the patient want to be seen by Chronic Care Management for management of their diabetes?  No  Would the patient like to be referred to a Nutritionist or for Diabetic Management?  No   Diabetic Exams:  Diabetic Eye Exam: Completed 06/10/17.   Diabetic Foot Exam: Completed 12/23/16. Pt has been advised about the importance in completing this exam. Note made to complete at CPE today.     How often do you need to have someone help you when you read instructions, pamphlets, or other written materials from your doctor or pharmacy?: 1 - Never  Interpreter Needed?: No  Information entered by :: MMarkoski, LPN  Past Medical History:  Diagnosis Date  . Anginal pain ([place])   . Arthritis   . Coronary artery disease   . Diabetes mellitus without complication ([place])   . Diverticulitis   . Dysrhythmia   . Heart disease   . Hypercholesteremia   . Hypertension   . Palpitations    Past Surgical History:  Procedure Laterality Date  . CARDIAC SURGERY    . CATARACT EXTRACTION W/PHACO Left 01/28/2015   Procedure: CATARACT EXTRACTION PHACO AND INTRAOCULAR LENS PLACEMENT (IOC);  Surgeon: Estill Cotta, MD;  Location: [facility] ORS;  Service: Ophthalmology;  Laterality: Left;  [place] 01:13 AP% 58.5 CDE 30.55 fluid pack lot # [id number]  . CATARACT EXTRACTION W/PHACO Right 03/18/2015   Procedure: CATARACT EXTRACTION PHACO AND INTRAOCULAR LENS PLACEMENT (IOC);  Surgeon: Estill Cotta, MD;  Location: [facility] ORS;  Service: Ophthalmology;  Laterality: Right;  [place]    1:08.3 AP%   24 CDE   30.33 fluid pack lot # [id number]  . CORONARY ANGIOPLASTY     stent  . CORONARY STENT PLACEMENT  06/17/2011  . FINGER SURGERY     for trigger finger  . TONSILLECTOMY     Family History  Problem Relation Age of Onset  . Hypertension Mother   . Diabetes Mother   . Heart attack Father   . Colon  cancer Sister    Social History   Socioeconomic History  . Marital status: Widowed    Spouse name: Not on file  . Number of children: 2  . Years of education: Not on file  . Highest education level: Associate degree: occupational, Hotel manager, or vocational program  Occupational History  . Occupation: retired  Scientific laboratory technician  . Financial resource strain: Not hard at all  . Food insecurity:    Worry: Never true    Inability: Never true  . Transportation needs:    Medical: No    Non-medical: No  Tobacco Use  . Smoking status: Never Smoker  . Smokeless tobacco: Never Used  Substance and Sexual Activity  . Alcohol use: No  . Drug use: No  . Sexual activity: Not on file  Lifestyle  . Physical activity:    Days per week: 5 days    Minutes per session: 20 min  . Stress: Only a little  Relationships  . Social connections:    Talks on phone: Patient refused    Gets together: Patient refused    Attends religious service: Patient refused    Active member of club or organization: Patient refused    Attends meetings of clubs or organizations: Patient refused    Relationship status: Patient refused  Other Topics Concern  . Not on file  Social History Narrative  . Not on file    Outpatient Encounter Medications as of 01/04/2018  Medication Sig  . aspirin 81 MG tablet Take 81 mg by mouth daily.   . cetirizine (ZYRTEC) 10 MG tablet Take 1 tablet (10 mg total) by mouth daily. (Patient taking differently: Take 10 mg by mouth daily. )  . cloNIDine (CATAPRES) 0.1 MG tablet TAKE 1 TABLET (0.1 MG TOTAL) BY MOUTH 3 (THREE) TIMES DAILY.  . cyclobenzaprine (FLEXERIL) 10 MG tablet Take 1 tablet (10 mg total) by mouth at bedtime. (Patient taking differently: Take 10 mg by mouth at bedtime. )  . diltiazem (DILACOR XR) 120 MG 24 hr capsule Take 1 capsule (120 mg total) by mouth daily.  . fluticasone (FLONASE) 50 MCG/ACT nasal spray Place 2 sprays daily into both nostrils. (Patient taking  differently: Place 2 sprays into both nostrils daily. )  . hydrochlorothiazide (HYDRODIURIL) 25 MG tablet TAKE 1 TABLET EVERY DAY  . LORazepam (ATIVAN) 0.5 MG tablet TAKE 1 TABLET BY MOUTH TWICE A DAY AS NEEDED FOR ANXIETY  . meloxicam (MOBIC) 7.5 MG tablet Take 7.5 mg by mouth daily.   . metFORMIN (GLUCOPHAGE) 500 MG tablet TAKE 1 TABLET EVERY DAY  . metoprolol succinate (TOPROL-XL) 50 MG 24 hr tablet TAKE 1 TABLET BY MOUTH EVERY DAY  . olmesartan (BENICAR) 40 MG tablet TAKE 1 TABLET (40 MG TOTAL) BY MOUTH DAILY.  Mar[place] Kitchen  sodium chloride (OCEAN) 0.65 % SOLN nasal spray Place 2 sprays into both nostrils every 2 (two) hours while awake.  . ezetimibe (ZETIA) 10 MG tablet Take 10 mg by mouth daily.   . pravastatin (PRAVACHOL) 10 MG tablet Take by mouth.  . tolterodine (DETROL LA) 4 MG 24 hr capsule Take 1 capsule (4 mg total) by mouth daily. (Patient not taking: Reported on 12/23/2016)   No facility-administered encounter medications on file as of 01/04/2018.     Activities of Daily Living In your present state of health, do you have any difficulty performing the following activities: 01/04/2018  Hearing? N  Vision? N  Difficulty concentrating or making decisions? N  Walking or climbing stairs? N  Dressing or bathing? N  Doing errands, shopping? N  Preparing Food and eating ? N  Using the Toilet? N  In the past six months, have you accidently leaked urine? N  Do you have problems with loss of bowel control? N  Managing your Medications? N  Managing your Finances? N  Housekeeping or managing your Housekeeping? N  Some recent data might be hidden    Patient Care Team: Jerrol Banana., MD as PCP - General (Family Medicine) Dingeldein, Remo Lipps, MD as Consulting Physician (Ophthalmology) Yolonda Kida, MD as Consulting Physician (Cardiology) Magnus Sinning, MD as Referring Physician (Nephrology)    Assessment:   This is a routine wellness examination for Valerie Foster.  Exercise  Activities and Dietary recommendations Current Exercise Habits: Home exercise routine, Type of exercise: strength training/weights, Time (Minutes): 20, Frequency (Times/Week): 5, Weekly Exercise (Minutes/Week): 100, Intensity: Mild, Exercise limited by: None identified  Goals    . Increase water intake     Recommend increasing water intake to 4-6 glasses a day.       Fall Risk Fall Risk  01/04/2018 08/05/2016 08/13/2015 01/31/2015  Falls in the past year? 0 No No No   FALL RISK PREVENTION PERTAINING TO THE HOME:  Any stairs in or around the home WITH handrails? No  Home free of loose throw rugs in walkways, pet beds, electrical cords, etc? Yes  Adequate lighting in your home to reduce risk of falls? Yes   ASSISTIVE DEVICES UTILIZED TO PREVENT FALLS:  Life alert? No  Use of a cane, walker or w/c? No  Grab bars in the bathroom? Yes  Shower chair or bench in shower? No  Elevated toilet seat or a handicapped toilet? Yes    TIMED UP AND GO:  Was the test performed? No .     Depression Screen PHQ 2/9 Scores 01/04/2018 08/05/2016 08/05/2016 08/13/2015  PHQ - 2 Score 0 0 0 0  PHQ- 9 Score - 1 - -     Cognitive Function: Declined today.      6CIT Screen 08/05/2016 08/05/2016  What Year? 0 points 0 points  What month? 0 points 0 points  What time? 0 points 0 points  Count back from 20 0 points -  Months in reverse 0 points -  Repeat phrase 2 points -  Total Score 2 -    Immunization History  Administered Date(s) Administered  . Influenza, High Dose Seasonal PF 01/31/2015, 12/31/2015, 12/23/2016, 01/04/2018  . Pneumococcal Conjugate-13 08/29/2013  . Pneumococcal Polysaccharide-23 01/28/2006  . Td 04/09/2016    Qualifies for Shingles Vaccine? Yes . Due for Shingrix. Education has been provided regarding the importance of this vaccine. Pt has been advised to call insurance company to determine out of pocket expense. Advised may  also receive vaccine at local pharmacy or Health  Dept. Verbalized acceptance and understanding.  Tdap: Up to date  Flu Vaccine: Due for Flu vaccine. Does the patient want to receive this vaccine today?  Yes .   Pneumococcal Vaccine: Up to date   Screening Tests Health Maintenance  Topic Date Due  . FOOT EXAM  12/23/2017  . HEMOGLOBIN A1C  02/11/2018  . OPHTHALMOLOGY EXAM  06/11/2018  . TETANUS/TDAP  04/09/2026  . INFLUENZA VACCINE  Completed  . DEXA SCAN  Completed  . PNA vac Low Risk Adult  Completed    Cancer Screenings:  Colorectal Screening: Completed 11/27/08. No longer required.  Mammogram: Completed 07/18/14.No longer required.   Bone Density: Completed 06/14/07. Ordered today.   Lung Cancer Screening: (Low Dose CT Chest recommended if Age 66-80 years, 30 pack-year currently smoking OR have quit w/in 15years.) does not qualify.    Additional Screening:  Vision Screening: Recommended annual ophthalmology exams for early detection of glaucoma and other disorders of the eye.  Dental Screening: Recommended annual dental exams for proper oral hygiene  Community Resource Referral:  CRR required this visit?  No       Plan:  I have personally reviewed and addressed the Medicare Annual Wellness questionnaire and have noted the following in the patient's chart:  A. Medical and social history B. Use of alcohol, tobacco or illicit drugs  C. Current medications and supplements D. Functional ability and status E.  Nutritional status F.  Physical activity G. Advance directives H. List of other physicians I.  Hospitalizations, surgeries, and ER visits in previous 12 months J.  [place] such as hearing and vision if needed, cognitive and depression L. Referrals and appointments - none  In addition, I have reviewed and discussed with patient certain preventive protocols, quality metrics, and best practice recommendations. A written personalized care plan for preventive services as well as general  preventive health recommendations were provided to patient.  See attached scanned questionnaire for additional information.   Signed,  Fabio Neighbors, LPN Nurse Health Advisor   Nurse Recommendations: None.

## 2018-01-04 NOTE — Patient Instructions (Addendum)
Valerie Foster , Thank you for taking time to come for your Medicare Wellness Visit. I appreciate your ongoing commitment to your health goals. Please review the following plan we discussed and let me know if I can assist you in the future.   Screening recommendations/referrals: Colonoscopy: No longer required.  Mammogram: No longer required.  Bone Density: Ordered today.  Recommended yearly ophthalmology/optometry visit for glaucoma screening and checkup Recommended yearly dental visit for hygiene and checkup  Vaccinations: Influenza vaccine: Administered today. Pneumococcal vaccine: Completed series Tdap vaccine: Up to date, due 03/2026 Shingles vaccine: Pt declines today.     Advanced directives: Please bring a copy of your POA (Power of Attorney) and/or Living Will to your next appointment.   Conditions/risks identified: Continue increasing water intake to 4-6 glasses every day.   Next appointment: 2:00 PM today with Dr Rosanna Randy.    Preventive Care 85 Years and Older, Female Preventive care refers to lifestyle choices and visits with your health care provider that can promote health and wellness. What does preventive care include?  A yearly physical exam. This is also called an annual well check.  Dental exams once or twice a year.  Routine eye exams. Ask your health care provider how often you should have your eyes checked.  Personal lifestyle choices, including:  Daily care of your teeth and gums.  Regular physical activity.  Eating a healthy diet.  Avoiding tobacco and drug use.  Limiting alcohol use.  Practicing safe sex.  Taking low-dose aspirin every day.  Taking vitamin and mineral supplements as recommended by your health care provider. What happens during an annual well check? The services and screenings done by your health care provider during your annual well check will depend on your age, overall health, lifestyle risk factors, and family history of  disease. Counseling  Your health care provider may ask you questions about your:  Alcohol use.  Tobacco use.  Drug use.  Emotional well-being.  Home and relationship well-being.  Sexual activity.  Eating habits.  History of falls.  Memory and ability to understand (cognition).  Work and work Statistician.  Reproductive health. Screening  You may have the following tests or measurements:  Height, weight, and BMI.  Blood pressure.  Lipid and cholesterol levels. These may be checked every 5 years, or more frequently if you are over 88 years old.  Skin check.  Lung cancer screening. You may have this screening every year starting at age 43 if you have a 30-pack-year history of smoking and currently smoke or have quit within the past 15 years.  Fecal occult blood test (FOBT) of the stool. You may have this test every year starting at age 26.  Flexible sigmoidoscopy or colonoscopy. You may have a sigmoidoscopy every 5 years or a colonoscopy every 10 years starting at age 47.  Hepatitis C blood test.  Hepatitis B blood test.  Sexually transmitted disease (STD) testing.  Diabetes screening. This is done by checking your blood sugar (glucose) after you have not eaten for a while (fasting). You may have this done every 1-3 years.  Bone density scan. This is done to screen for osteoporosis. You may have this done starting at age 27.  Mammogram. This may be done every 1-2 years. Talk to your health care provider about how often you should have regular mammograms. Talk with your health care provider about your test results, treatment options, and if necessary, the need for more tests. Vaccines  Your health care provider  may recommend certain vaccines, such as:  Influenza vaccine. This is recommended every year.  Tetanus, diphtheria, and acellular pertussis (Tdap, Td) vaccine. You may need a Td booster every 10 years.  Zoster vaccine. You may need this after age  83.  Pneumococcal 13-valent conjugate (PCV13) vaccine. One dose is recommended after age 14.  Pneumococcal polysaccharide (PPSV23) vaccine. One dose is recommended after age 78. Talk to your health care provider about which screenings and vaccines you need and how often you need them. This information is not intended to replace advice given to you by your health care provider. Make sure you discuss any questions you have with your health care provider. Document Released: 03/08/2015 Document Revised: 10/30/2015 Document Reviewed: 12/11/2014 Elsevier Interactive Patient Education  2017 [place] Prevention in the Home Falls can cause injuries. They can happen to people of all ages. There are many things you can do to make your home safe and to help prevent falls. What can I do on the outside of my home?  Regularly fix the edges of walkways and driveways and fix any cracks.  Remove anything that might make you trip as you walk through a door, such as a raised step or threshold.  Trim any bushes or trees on the path to your home.  Use bright outdoor lighting.  Clear any walking paths of anything that might make someone trip, such as rocks or tools.  Regularly check to see if handrails are loose or broken. Make sure that both sides of any steps have handrails.  Any raised decks and porches should have guardrails on the edges.  Have any leaves, snow, or ice cleared regularly.  Use sand or salt on walking paths during winter.  Clean up any spills in your garage right away. This includes oil or grease spills. What can I do in the bathroom?  Use night lights.  Install grab bars by the toilet and in the tub and shower. Do not use towel bars as grab bars.  Use non-skid mats or decals in the tub or shower.  If you need to sit down in the shower, use a plastic, non-slip stool.  Keep the floor dry. Clean up any water that spills on the floor as soon as it happens.  Remove  soap buildup in the tub or shower regularly.  Attach bath mats securely with double-sided non-slip rug tape.  Do not have throw rugs and other things on the floor that can make you trip. What can I do in the bedroom?  Use night lights.  Make sure that you have a light by your bed that is easy to reach.  Do not use any sheets or blankets that are too big for your bed. They should not hang down onto the floor.  Have a firm chair that has side arms. You can use this for support while you get dressed.  Do not have throw rugs and other things on the floor that can make you trip. What can I do in the kitchen?  Clean up any spills right away.  Avoid walking on wet floors.  Keep items that you use a lot in easy-to-reach places.  If you need to reach something above you, use a strong step stool that has a grab bar.  Keep electrical cords out of the way.  Do not use floor polish or wax that makes floors slippery. If you must use wax, use non-skid floor wax.  Do not have throw rugs  and other things on the floor that can make you trip. What can I do with my stairs?  Do not leave any items on the stairs.  Make sure that there are handrails on both sides of the stairs and use them. Fix handrails that are broken or loose. Make sure that handrails are as long as the stairways.  Check any carpeting to make sure that it is firmly attached to the stairs. Fix any carpet that is loose or worn.  Avoid having throw rugs at the top or bottom of the stairs. If you do have throw rugs, attach them to the floor with carpet tape.  Make sure that you have a light switch at the top of the stairs and the bottom of the stairs. If you do not have them, ask someone to add them for you. What else can I do to help prevent falls?  Wear shoes that:  Do not have high heels.  Have rubber bottoms.  Are comfortable and fit you well.  Are closed at the toe. Do not wear sandals.  If you use a  stepladder:  Make sure that it is fully opened. Do not climb a closed stepladder.  Make sure that both sides of the stepladder are locked into place.  Ask someone to hold it for you, if possible.  Clearly mark and make sure that you can see:  Any grab bars or handrails.  First and last steps.  Where the edge of each step is.  Use tools that help you move around (mobility aids) if they are needed. These include:  Canes.  Walkers.  Scooters.  Crutches.  Turn on the lights when you go into a dark area. Replace any light bulbs as soon as they burn out.  Set up your furniture so you have a clear path. Avoid moving your furniture around.  If any of your floors are uneven, fix them.  If there are any pets around you, be aware of where they are.  Review your medicines with your doctor. Some medicines can make you feel dizzy. This can increase your chance of falling. Ask your doctor what other things that you can do to help prevent falls. This information is not intended to replace advice given to you by your health care provider. Make sure you discuss any questions you have with your health care provider. Document Released: 12/06/2008 Document Revised: 07/18/2015 Document Reviewed: 03/16/2014 Elsevier Interactive Patient Education  2017 [employer].

## 2018-01-05 DIAGNOSIS — E119 Type 2 diabetes mellitus without complications: Secondary | ICD-10-CM | POA: Diagnosis not present

## 2018-01-05 DIAGNOSIS — I10 Essential (primary) hypertension: Secondary | ICD-10-CM | POA: Diagnosis not present

## 2018-01-06 LAB — CBC WITH DIFFERENTIAL/PLATELET
BASOS: 1 %
Basophils Absolute: 0.1 10*3/uL (ref 0.0–0.2)
EOS (ABSOLUTE): 0.1 10*3/uL (ref 0.0–0.4)
EOS: 2 %
HEMATOCRIT: 36.8 % (ref 34.0–46.6)
HEMOGLOBIN: 12.4 g/dL (ref 11.1–15.9)
Immature Grans (Abs): 0 10*3/uL (ref 0.0–0.1)
Immature Granulocytes: 0 %
LYMPHS ABS: 0.7 10*3/uL (ref 0.7–3.1)
Lymphs: 15 %
MCH: 27.8 pg (ref 26.6–33.0)
MCHC: 33.7 g/dL (ref 31.5–35.7)
MCV: 83 fL (ref 79–97)
MONOCYTES: 9 %
Monocytes Absolute: 0.5 10*3/uL (ref 0.1–0.9)
NEUTROS ABS: 3.6 10*3/uL (ref 1.4–7.0)
Neutrophils: 73 %
Platelets: 260 10*3/uL (ref 150–450)
RBC: 4.46 x10E6/uL (ref 3.77–5.28)
RDW: 13.3 % (ref 12.3–15.4)
WBC: 5 10*3/uL (ref 3.4–10.8)

## 2018-01-06 LAB — COMPREHENSIVE METABOLIC PANEL
ALBUMIN: 4.1 g/dL (ref 3.5–4.7)
ALK PHOS: 70 IU/L (ref 39–117)
ALT: 13 IU/L (ref 0–32)
AST: 14 IU/L (ref 0–40)
Albumin/Globulin Ratio: 1.6 (ref 1.2–2.2)
BUN / CREAT RATIO: 16 (ref 12–28)
BUN: 25 mg/dL (ref 8–27)
Bilirubin Total: 0.3 mg/dL (ref 0.0–1.2)
CO2: 25 mmol/L (ref 20–29)
CREATININE: 1.6 mg/dL — AB (ref 0.57–1.00)
Calcium: 9.7 mg/dL (ref 8.7–10.3)
Chloride: 99 mmol/L (ref 96–106)
GFR calc Af Amer: 34 mL/min/{1.73_m2} — ABNORMAL LOW (ref >59)
GFR, EST NON AFRICAN AMERICAN: 30 mL/min/{1.73_m2} — AB (ref >59)
GLOBULIN, TOTAL: 2.6 g/dL (ref 1.5–4.5)
Glucose: 110 mg/dL — ABNORMAL HIGH (ref 65–99)
Potassium: 4.4 mmol/L (ref 3.5–5.2)
SODIUM: 140 mmol/L (ref 134–144)
Total Protein: 6.7 g/dL (ref 6.0–8.5)

## 2018-01-06 LAB — HEMOGLOBIN A1C
Est. average glucose Bld gHb Est-mCnc: 151 mg/dL
HEMOGLOBIN A1C: 6.9 % — AB (ref 4.8–5.6)

## 2018-01-06 LAB — LIPID PANEL
CHOL/HDL RATIO: 5.9 ratio — AB (ref 0.0–4.4)
Cholesterol, Total: 278 mg/dL — ABNORMAL HIGH (ref 100–199)
HDL: 47 mg/dL (ref >39)
LDL Calculated: 182 mg/dL — ABNORMAL HIGH (ref 0–99)
Triglycerides: 246 mg/dL — ABNORMAL HIGH (ref 0–149)
VLDL Cholesterol Cal: 49 mg/dL — ABNORMAL HIGH (ref 5–40)

## 2018-01-06 LAB — TSH: TSH: 2.69 u[IU]/mL (ref 0.450–4.500)

## 2018-01-07 ENCOUNTER — Other Ambulatory Visit: Payer: Self-pay

## 2018-01-10 ENCOUNTER — Telehealth: Payer: Self-pay

## 2018-01-10 DIAGNOSIS — E785 Hyperlipidemia, unspecified: Secondary | ICD-10-CM | POA: Diagnosis not present

## 2018-01-10 DIAGNOSIS — F419 Anxiety disorder, unspecified: Secondary | ICD-10-CM | POA: Diagnosis not present

## 2018-01-10 DIAGNOSIS — K219 Gastro-esophageal reflux disease without esophagitis: Secondary | ICD-10-CM | POA: Diagnosis not present

## 2018-01-10 DIAGNOSIS — R0602 Shortness of breath: Secondary | ICD-10-CM | POA: Diagnosis not present

## 2018-01-10 DIAGNOSIS — E7800 Pure hypercholesterolemia, unspecified: Secondary | ICD-10-CM | POA: Diagnosis not present

## 2018-01-10 DIAGNOSIS — N289 Disorder of kidney and ureter, unspecified: Secondary | ICD-10-CM

## 2018-01-10 DIAGNOSIS — I10 Essential (primary) hypertension: Secondary | ICD-10-CM | POA: Diagnosis not present

## 2018-01-10 DIAGNOSIS — E7849 Other hyperlipidemia: Secondary | ICD-10-CM | POA: Diagnosis not present

## 2018-01-10 DIAGNOSIS — I208 Other forms of angina pectoris: Secondary | ICD-10-CM | POA: Diagnosis not present

## 2018-01-10 DIAGNOSIS — R42 Dizziness and giddiness: Secondary | ICD-10-CM | POA: Diagnosis not present

## 2018-01-10 DIAGNOSIS — R000 Tachycardia, unspecified: Secondary | ICD-10-CM | POA: Diagnosis not present

## 2018-01-10 DIAGNOSIS — I2510 Atherosclerotic heart disease of native coronary artery without angina pectoris: Secondary | ICD-10-CM | POA: Diagnosis not present

## 2018-01-10 NOTE — Telephone Encounter (Signed)
Left message to call back  

## 2018-01-10 NOTE — Telephone Encounter (Signed)
-----   Message from Jerrol Banana., MD sent at 01/07/2018 10:11 AM EST ----- Hydrate--Kidney function a little worse in past 4 months--consider nephrology referral or RTC 1-2 months to recheck.

## 2018-01-10 NOTE — Telephone Encounter (Signed)
Pt returned missed call.  Please call pt back to disclose results. ° °Thanks, °TGH °

## 2018-01-11 ENCOUNTER — Ambulatory Visit: Payer: Self-pay | Admitting: Family Medicine

## 2018-01-11 NOTE — Telephone Encounter (Signed)
Patient stated she saw Dr. Clayborn Bigness recently and he advised patient that she may benefit from Repatha injections. Patient wanted your opinion on Repatha or if you would recommend another medication to help lower her cholesterol? Please advise?

## 2018-01-11 NOTE — Telephone Encounter (Signed)
Patient also wanted to know if the Metformin could be causing her kidney function to decline? She is requesting that she take something different if so. Please advise. Thanks!

## 2018-01-11 NOTE — Telephone Encounter (Signed)
Left message to call back  

## 2018-01-11 NOTE — Telephone Encounter (Signed)
Repatha would be great if possible. Does Dr Clayborn Bigness prescribe it? For that medicine that might be best/.

## 2018-01-11 NOTE — Telephone Encounter (Signed)
Patient was advised of results. Patient is agreeable to nephrology referral. Referral in epic.

## 2018-01-14 NOTE — Telephone Encounter (Signed)
Please advise message below concerning metformin?

## 2018-01-17 NOTE — Telephone Encounter (Signed)
LMOVM for pt to return call 

## 2018-01-17 NOTE — Telephone Encounter (Signed)
Metformin will not cause kidney decline but I would stop it because of the kidney decline.  Potential metformin side effects are higher with kidney function not as normal.

## 2018-01-18 NOTE — Telephone Encounter (Signed)
Left message to call back  

## 2018-01-21 NOTE — Telephone Encounter (Signed)
Advised patient as below. Patient reports that she has an appt with the kidney specialists on Tues (12/3) to discuss kidney functions and will let Dr. Rosanna Randy know if she is going to stop the metformin. Patient feels that she does need the extra dose at night due to high sugar readings, but does not want to do this if it is effecting her kidneys.   Patient will update [place] on what the kidney specialist says about Metformin.

## 2018-01-31 ENCOUNTER — Encounter: Payer: Self-pay | Admitting: Family Medicine

## 2018-01-31 DIAGNOSIS — E1129 Type 2 diabetes mellitus with other diabetic kidney complication: Secondary | ICD-10-CM | POA: Diagnosis not present

## 2018-01-31 DIAGNOSIS — N183 Chronic kidney disease, stage 3 (moderate): Secondary | ICD-10-CM | POA: Diagnosis not present

## 2018-01-31 DIAGNOSIS — I129 Hypertensive chronic kidney disease with stage 1 through stage 4 chronic kidney disease, or unspecified chronic kidney disease: Secondary | ICD-10-CM | POA: Diagnosis not present

## 2018-02-01 LAB — BASIC METABOLIC PANEL
BUN: 28 — AB (ref 4–21)
Creatinine: 1.5 — AB (ref <=1.1)
Glucose: 84
Potassium: 4 (ref 3.4–5.3)
Sodium: 140 (ref 137–147)

## 2018-02-02 ENCOUNTER — Other Ambulatory Visit: Payer: Self-pay | Admitting: Family Medicine

## 2018-02-02 DIAGNOSIS — F419 Anxiety disorder, unspecified: Secondary | ICD-10-CM

## 2018-02-02 NOTE — Telephone Encounter (Signed)
Please review. Thanks!  

## 2018-02-02 NOTE — Telephone Encounter (Signed)
Pt needing refill on: LORazepam (ATIVAN) 0.5 MG tablet  Please fill at:  [id number] - [place], [place] - [address] [phone] (Phone) [phone] (Fax)   Thanks, [employer]

## 2018-02-03 ENCOUNTER — Other Ambulatory Visit: Payer: Self-pay | Admitting: Family Medicine

## 2018-02-03 DIAGNOSIS — I10 Essential (primary) hypertension: Secondary | ICD-10-CM

## 2018-02-03 MED ORDER — DILTIAZEM HCL ER 120 MG PO CP24: 120.0000 mg | ORAL_CAPSULE | Freq: Every day | ORAL | 3 refills | Status: AC

## 2018-02-03 MED ORDER — LORAZEPAM 0.5 MG PO TABS: 0.5000 mg | ORAL_TABLET | Freq: Two times a day (BID) | ORAL | 4 refills | Status: AC | PRN

## 2018-02-03 NOTE — Telephone Encounter (Signed)
[place], faxed refill request for the following medications:  diltiazem (DILACOR XR) 120 MG 24 hr capsule  90 day supply  Last Rx: 11/24/16 Please advise. Thanks TNP

## 2018-02-04 ENCOUNTER — Other Ambulatory Visit: Payer: Self-pay | Admitting: Family Medicine

## 2018-02-04 DIAGNOSIS — I10 Essential (primary) hypertension: Secondary | ICD-10-CM

## 2018-02-24 DIAGNOSIS — N183 Chronic kidney disease, stage 3 (moderate): Secondary | ICD-10-CM | POA: Diagnosis not present

## 2018-02-24 DIAGNOSIS — E1129 Type 2 diabetes mellitus with other diabetic kidney complication: Secondary | ICD-10-CM | POA: Diagnosis not present

## 2018-02-24 DIAGNOSIS — I129 Hypertensive chronic kidney disease with stage 1 through stage 4 chronic kidney disease, or unspecified chronic kidney disease: Secondary | ICD-10-CM | POA: Diagnosis not present

## 2018-03-01 ENCOUNTER — Other Ambulatory Visit: Payer: Self-pay | Admitting: Family Medicine

## 2018-03-01 DIAGNOSIS — I10 Essential (primary) hypertension: Secondary | ICD-10-CM

## 2018-03-10 ENCOUNTER — Encounter: Payer: Self-pay | Admitting: Family Medicine

## 2018-03-10 ENCOUNTER — Ambulatory Visit (INDEPENDENT_AMBULATORY_CARE_PROVIDER_SITE_OTHER): Payer: Medicare Other | Admitting: Family Medicine

## 2018-03-10 VITALS — BP 162/84 | HR 50 | Temp 97.9°F | Resp 16 | Wt 175.0 lb

## 2018-03-10 DIAGNOSIS — F5109 Other insomnia not due to a substance or known physiological condition: Secondary | ICD-10-CM

## 2018-03-10 DIAGNOSIS — E7800 Pure hypercholesterolemia, unspecified: Secondary | ICD-10-CM | POA: Diagnosis not present

## 2018-03-10 DIAGNOSIS — I10 Essential (primary) hypertension: Secondary | ICD-10-CM

## 2018-03-10 DIAGNOSIS — F419 Anxiety disorder, unspecified: Secondary | ICD-10-CM | POA: Diagnosis not present

## 2018-03-10 DIAGNOSIS — E119 Type 2 diabetes mellitus without complications: Secondary | ICD-10-CM | POA: Diagnosis not present

## 2018-03-10 DIAGNOSIS — I2510 Atherosclerotic heart disease of native coronary artery without angina pectoris: Secondary | ICD-10-CM | POA: Diagnosis not present

## 2018-03-10 MED ORDER — TRAZODONE HCL 50 MG PO TABS: 50.0000 mg | ORAL_TABLET | Freq: Every day | ORAL | 5 refills | Status: DC

## 2018-03-10 MED ORDER — AMLODIPINE BESYLATE 2.5 MG PO TABS: 2.5000 mg | ORAL_TABLET | Freq: Every day | ORAL | 5 refills | Status: DC

## 2018-03-10 NOTE — Progress Notes (Signed)
Patient: Valerie Foster Female    DOB: January 03, 1934   85 y.o.   MRN: [id number] Visit Date: 03/10/2018  Today's Provider: [name], MD   Chief Complaint  Patient presents with  . Hypertension   Subjective:     HPI   Hypertension, follow-up:  BP Readings from Last 3 Encounters:  03/10/18 (!) 162/84  01/04/18 132/66  01/04/18 132/66    She was last seen for hypertension 2 months ago.  BP at that visit was 132/66. Management since that visit includes no changes. However, patient reports that she was seen by Dr. Candiss Norse on 02/24/2018, and she was started on Hydralazine 25mg  TID to help with BPs. She reports good compliance with treatment. Patient reports that her BP runs very high at night averaging in the 190s/90s. She reports that she knows when her BP is high when she experiences headaches and dizziness.  No cardiac symptoms.  She is adherent to low salt diet. Outside blood pressures are checked twice daily. Patient denies exertional chest pressure/discomfort, lower extremity edema and palpitations. Cardiovascular risk factors include diabetes mellitus and dyslipidemia.   She is having dificulty sleeping recently. She wishes something to help her. Weight trend: stable Wt Readings from Last 3 Encounters:  03/10/18 175 lb (79.4 kg)  01/04/18 175 lb 3.2 oz (79.5 kg)  01/04/18 175 lb 3.2 oz (79.5 kg)    Allergies  Allergen Reactions  . Statins     Joint and muscle pain severe  . Sulfa Antibiotics      Current Outpatient Medications:  .  aspirin 81 MG tablet, Take 81 mg by mouth daily. , Disp: , Rfl:  .  cetirizine (ZYRTEC) 10 MG tablet, Take 1 tablet (10 mg total) by mouth daily. (Patient taking differently: Take 10 mg by mouth daily. ), Disp: , Rfl:  .  cloNIDine (CATAPRES) 0.1 MG tablet, TAKE 1 TABLET (0.1 MG TOTAL) BY MOUTH 3 (THREE) TIMES DAILY., Disp: 270 tablet, Rfl: 3 .  cyclobenzaprine (FLEXERIL) 10 MG tablet, Take 1 tablet (10 mg total) by mouth at  bedtime. (Patient taking differently: Take 10 mg by mouth at bedtime. ), Disp: 30 tablet, Rfl: 5 .  diltiazem (CARDIZEM CD) 120 MG 24 hr capsule, TAKE 1 CAPSULE BY MOUTH EVERY DAY, Disp: 90 capsule, Rfl: 3 .  ezetimibe (ZETIA) 10 MG tablet, Take 10 mg by mouth daily. , Disp: , Rfl:  .  fluticasone (FLONASE) 50 MCG/ACT nasal spray, Place 2 sprays daily into both nostrils. (Patient taking differently: Place 2 sprays into both nostrils daily. ), Disp: 16 g, Rfl: 12 .  glucose blood test strip, Use as instructed, Disp: 100 each, Rfl: 12 .  hydrALAZINE (APRESOLINE) 25 MG tablet, Take 25 mg by mouth 3 (three) times daily., Disp: , Rfl:  .  hydrochlorothiazide (HYDRODIURIL) 25 MG tablet, TAKE 1 TABLET BY MOUTH EVERY DAY, Disp: 90 tablet, Rfl: 3 .  LORazepam (ATIVAN) 0.5 MG tablet, Take 1 tablet (0.5 mg total) by mouth 2 (two) times daily as needed for anxiety., Disp: 60 tablet, Rfl: 4 .  meloxicam (MOBIC) 7.5 MG tablet, Take 7.5 mg by mouth daily. , Disp: , Rfl:  .  metFORMIN (GLUCOPHAGE) 500 MG tablet, TAKE 1 TABLET BY MOUTH EVERY DAY, Disp: 90 tablet, Rfl: 3 .  metoprolol succinate (TOPROL-XL) 50 MG 24 hr tablet, TAKE 1 TABLET BY MOUTH EVERY DAY, Disp: 90 tablet, Rfl: 3 .  olmesartan (BENICAR) 40 MG tablet, TAKE 1 TABLET BY MOUTH  EVERY DAY, Disp: 90 tablet, Rfl: 4 .  pravastatin (PRAVACHOL) 10 MG tablet, Take by mouth., Disp: , Rfl:  .  sodium chloride (OCEAN) 0.65 % SOLN nasal spray, Place 2 sprays into both nostrils every 2 (two) hours while awake., Disp: , Rfl: 0 .  diltiazem (DILACOR XR) 120 MG 24 hr capsule, Take 1 capsule (120 mg total) by mouth daily., Disp: 90 capsule, Rfl: 3 .  tolterodine (DETROL LA) 4 MG 24 hr capsule, Take 1 capsule (4 mg total) by mouth daily. (Patient not taking: Reported on 12/23/2016), Disp: 30 capsule, Rfl: 3  Review of Systems  Constitutional: Positive for fatigue. Negative for activity change, appetite change, chills and unexpected weight change.  HENT: Negative.     Eyes: Negative.   Respiratory: Negative for cough, chest tightness and shortness of breath.   Cardiovascular: Negative for chest pain, palpitations and leg swelling.  Gastrointestinal: Negative.   Endocrine: Negative.   Musculoskeletal: Positive for arthralgias and myalgias.  Allergic/Immunologic: Negative.   Neurological: Positive for dizziness and light-headedness.  Hematological: Negative.   Psychiatric/Behavioral: The patient is nervous/anxious.     Social History   Tobacco Use  . Smoking status: Never Smoker  . Smokeless tobacco: Never Used  Substance Use Topics  . Alcohol use: No      Objective:   BP (!) 162/84 (BP Location: Left Arm, Patient Position: Sitting, Cuff Size: Normal)   Pulse (!) 50   Temp 97.9 F (36.6 C)   Resp 16   Wt 175 lb (79.4 kg)   SpO2 99%   BMI 27.41 kg/m  Vitals:   03/10/18 0839  BP: (!) 162/84  Pulse: (!) 50  Resp: 16  Temp: 97.9 F (36.6 C)  SpO2: 99%  Weight: 175 lb (79.4 kg)     Physical Exam Constitutional:      Appearance: She is well-developed.  HENT:     Head: Normocephalic and atraumatic.     Right Ear: External ear normal.     Left Ear: External ear normal.     Nose: Nose normal.  Eyes:     General: No scleral icterus.    Conjunctiva/sclera: Conjunctivae normal.  Neck:     Thyroid: No thyromegaly.  Cardiovascular:     Rate and Rhythm: Normal rate and regular rhythm.     Heart sounds: Normal heart sounds.  Pulmonary:     Effort: Pulmonary effort is normal.     Breath sounds: Normal breath sounds.  Chest:     Chest wall: No mass or tenderness.     Breasts:        Right: No mass.        Left: No mass.  Abdominal:     Palpations: Abdomen is soft.  Skin:    General: Skin is warm and dry.  Neurological:     Mental Status: She is alert and oriented to person, place, and time.  Psychiatric:        Behavior: Behavior normal.        Thought Content: Thought content normal.        Judgment: Judgment normal.          Assessment & Plan    1. Insomnia RTC 1-2 months. Avoid Benzos. - traZODone (DESYREL) 50 MG tablet; Take 1 tablet (50 mg total) by mouth at bedtime.  Dispense: 30 tablet; Refill: 5  2. Essential hypertension Add for better control.Stop Cardizem. - amLODipine (NORVASC) 2.5 MG tablet; Take 1 tablet (2.5 mg total) by  mouth daily.  Dispense: 30 tablet; Refill: 5  3. CAD in native artery No ASCVD/  4. Type 2 diabetes mellitus without complication, without long-term current use of insulin (HCC) A1C on next OV.  5. Hypercholesteremia   6. Chronic anxiety  I have done the exam and reviewed the chart and it is accurate to the best of my knowledge. Development worker, community has been used and  any errors in dictation or transcription are unintentional. Miguel Aschoff M.D. [place], MD  [facility]

## 2018-03-10 NOTE — Patient Instructions (Signed)
Start Amlodipine 2.5mg  daily.  Discontinue diltiazem 120mg .

## 2018-03-16 ENCOUNTER — Ambulatory Visit
Admission: RE | Admit: 2018-03-16 | Discharge: 2018-03-16 | Disposition: A | Discharge status: Home / Self Care | Payer: Medicare Other | Source: Ambulatory Visit | Attending: Family Medicine | Admitting: Family Medicine

## 2018-03-16 DIAGNOSIS — E2839 Other primary ovarian failure: Secondary | ICD-10-CM | POA: Diagnosis not present

## 2018-03-16 DIAGNOSIS — M85851 Other specified disorders of bone density and structure, right thigh: Secondary | ICD-10-CM | POA: Diagnosis not present

## 2018-03-16 DIAGNOSIS — Z780 Asymptomatic menopausal state: Secondary | ICD-10-CM | POA: Diagnosis not present

## 2018-03-24 ENCOUNTER — Other Ambulatory Visit: Payer: Self-pay

## 2018-03-24 ENCOUNTER — Encounter: Payer: Self-pay | Admitting: Family Medicine

## 2018-03-24 ENCOUNTER — Ambulatory Visit (INDEPENDENT_AMBULATORY_CARE_PROVIDER_SITE_OTHER): Payer: Medicare Other | Admitting: Family Medicine

## 2018-03-24 VITALS — BP 160/90 | HR 52 | Ht 67.0 in | Wt 172.8 lb

## 2018-03-24 DIAGNOSIS — I2510 Atherosclerotic heart disease of native coronary artery without angina pectoris: Secondary | ICD-10-CM | POA: Diagnosis not present

## 2018-03-24 DIAGNOSIS — I10 Essential (primary) hypertension: Secondary | ICD-10-CM

## 2018-03-24 DIAGNOSIS — F419 Anxiety disorder, unspecified: Secondary | ICD-10-CM | POA: Diagnosis not present

## 2018-03-24 DIAGNOSIS — N390 Urinary tract infection, site not specified: Secondary | ICD-10-CM | POA: Diagnosis not present

## 2018-03-24 DIAGNOSIS — E119 Type 2 diabetes mellitus without complications: Secondary | ICD-10-CM | POA: Diagnosis not present

## 2018-03-24 DIAGNOSIS — G629 Polyneuropathy, unspecified: Secondary | ICD-10-CM

## 2018-03-24 LAB — POCT URINALYSIS DIPSTICK
Bilirubin, UA: NEGATIVE
Blood, UA: NEGATIVE
Glucose, UA: NEGATIVE
Ketones, UA: NEGATIVE
Nitrite, UA: NEGATIVE
Protein, UA: NEGATIVE
SPEC GRAV UA: 1.02 (ref 1.010–1.025)
Urobilinogen, UA: NEGATIVE E.U./dL — AB
pH, UA: 6 (ref 5.0–8.0)

## 2018-03-24 MED ORDER — AMLODIPINE BESYLATE 5 MG PO TABS: 2.5000 mg | ORAL_TABLET | Freq: Every day | ORAL | 3 refills | Status: DC

## 2018-03-24 MED ORDER — CIPROFLOXACIN HCL 250 MG PO TABS: 250.0000 mg | ORAL_TABLET | Freq: Two times a day (BID) | ORAL | 0 refills | Status: DC

## 2018-03-24 NOTE — Progress Notes (Signed)
Patient: Valerie Foster Female    DOB: December 27, 1933   85 y.o.   MRN: [id number] Visit Date: 03/24/2018  Today's Provider: [name], MD   No chief complaint on file.  Subjective:     HPI   Follow up for insomnia and HTN  The patient was last seen for this 2 weeks ago. Changes made at last visit include stopping the diltiazem and started amlodipine.  Pt also given trazodone for insomnia  She reports fair compliance with treatment. She feels that condition is somewhat improved . She is having side effects. To one of the old medications hydralazine, she is getting pain in her breast and has to go to bathroom every 2 -3 hours and has protein in her urine.  She has slight swelling in her ankles and she feels it is coming from the amlodipine. Blood pressures have been running 132 to 158 over 78 to 84 in the mornings, heart rate has improved to 55 to 60.  At night her blood pressure seems to go up before taking medications 178 to 185 over 90 to 100.  Pt takes her bp meds at night until bedtime at 10:30pm and in the morning she takes it around 8:00 am.  ------------------------------------------------------------------------------------   Allergies  Allergen Reactions  . Statins     Joint and muscle pain severe  . Sulfa Antibiotics      Current Outpatient Medications:  .  amLODipine (NORVASC) 2.5 MG tablet, Take 1 tablet (2.5 mg total) by mouth daily., Disp: 30 tablet, Rfl: 5 .  aspirin 81 MG tablet, Take 81 mg by mouth daily. , Disp: , Rfl:  .  cetirizine (ZYRTEC) 10 MG tablet, Take 1 tablet (10 mg total) by mouth daily. (Patient taking differently: Take 10 mg by mouth daily. ), Disp: , Rfl:  .  cloNIDine (CATAPRES) 0.1 MG tablet, TAKE 1 TABLET (0.1 MG TOTAL) BY MOUTH 3 (THREE) TIMES DAILY., Disp: 270 tablet, Rfl: 3 .  cyclobenzaprine (FLEXERIL) 10 MG tablet, Take 1 tablet (10 mg total) by mouth at bedtime. (Patient taking differently: Take 10 mg by mouth at bedtime. ),  Disp: 30 tablet, Rfl: 5 .  diltiazem (DILACOR XR) 120 MG 24 hr capsule, Take 1 capsule (120 mg total) by mouth daily., Disp: 90 capsule, Rfl: 3 .  ezetimibe (ZETIA) 10 MG tablet, Take 10 mg by mouth daily. , Disp: , Rfl:  .  fluticasone (FLONASE) 50 MCG/ACT nasal spray, Place 2 sprays daily into both nostrils. (Patient taking differently: Place 2 sprays into both nostrils daily. ), Disp: 16 g, Rfl: 12 .  glucose blood test strip, Use as instructed, Disp: 100 each, Rfl: 12 .  hydrALAZINE (APRESOLINE) 25 MG tablet, Take 25 mg by mouth 3 (three) times daily., Disp: , Rfl:  .  LORazepam (ATIVAN) 0.5 MG tablet, Take 1 tablet (0.5 mg total) by mouth 2 (two) times daily as needed for anxiety., Disp: 60 tablet, Rfl: 4 .  meloxicam (MOBIC) 7.5 MG tablet, Take 7.5 mg by mouth daily. , Disp: , Rfl:  .  metFORMIN (GLUCOPHAGE) 500 MG tablet, TAKE 1 TABLET BY MOUTH EVERY DAY, Disp: 90 tablet, Rfl: 3 .  metoprolol succinate (TOPROL-XL) 50 MG 24 hr tablet, TAKE 1 TABLET BY MOUTH EVERY DAY, Disp: 90 tablet, Rfl: 3 .  olmesartan (BENICAR) 40 MG tablet, TAKE 1 TABLET BY MOUTH EVERY DAY, Disp: 90 tablet, Rfl: 4 .  sodium chloride (OCEAN) 0.65 % SOLN nasal spray, Place 2  sprays into both nostrils every 2 (two) hours while awake., Disp: , Rfl: 0 .  traZODone (DESYREL) 50 MG tablet, Take 1 tablet (50 mg total) by mouth at bedtime., Disp: 30 tablet, Rfl: 5 .  hydrochlorothiazide (HYDRODIURIL) 25 MG tablet, TAKE 1 TABLET BY MOUTH EVERY DAY (Patient not taking: Reported on 03/24/2018), Disp: 90 tablet, Rfl: 3 .  pravastatin (PRAVACHOL) 10 MG tablet, Take by mouth., Disp: , Rfl:  .  tolterodine (DETROL LA) 4 MG 24 hr capsule, Take 1 capsule (4 mg total) by mouth daily. (Patient not taking: Reported on 12/23/2016), Disp: 30 capsule, Rfl: 3  Review of Systems  Constitutional: Negative.   HENT: Negative.   Eyes: Negative.   Respiratory: Negative.   Cardiovascular: Negative.   Gastrointestinal: Negative.   Endocrine:  Negative.   Genitourinary: Positive for frequency.  Musculoskeletal: Negative.   Allergic/Immunologic: Negative.   Neurological: Negative.   Psychiatric/Behavioral: Negative.     Social History   Tobacco Use  . Smoking status: Never Smoker  . Smokeless tobacco: Never Used  Substance Use Topics  . Alcohol use: No      Objective:   BP (!) 160/90 (BP Location: Left Arm, Patient Position: Sitting, Cuff Size: Normal)   Pulse (!) 52   Ht 5\' 7"  (1.702 m)   Wt 172 lb 12.8 oz (78.4 kg)   SpO2 98%   BMI 27.06 kg/m  Vitals:   03/24/18 1201  BP: (!) 160/90  Pulse: (!) 52  SpO2: 98%  Weight: 172 lb 12.8 oz (78.4 kg)  Height: 5\' 7"  (1.702 m)     Physical Exam Constitutional:      Appearance: Normal appearance. She is well-developed.  HENT:     Head: Normocephalic and atraumatic.     Right Ear: External ear normal.     Left Ear: External ear normal.     Nose: Nose normal.  Eyes:     General: No scleral icterus.    Conjunctiva/sclera: Conjunctivae normal.  Neck:     Thyroid: No thyromegaly.  Cardiovascular:     Rate and Rhythm: Normal rate and regular rhythm.     Heart sounds: Normal heart sounds.  Pulmonary:     Effort: Pulmonary effort is normal.     Breath sounds: Normal breath sounds.  Abdominal:     Palpations: Abdomen is soft.  Musculoskeletal:     Right lower leg: No edema.     Left lower leg: No edema.  Skin:    General: Skin is warm and dry.  Neurological:     General: No focal deficit present.     Mental Status: She is alert and oriented to person, place, and time. Mental status is at baseline.  Psychiatric:        Behavior: Behavior normal.        Thought Content: Thought content normal.        Judgment: Judgment normal.         Assessment & Plan    1. Essential hypertension Increase amlodipine from 2.5 to 5 mg daily - amLODipine (NORVASC) 5 MG tablet; Take 0.5 tablets (2.5 mg total) by mouth daily.  Dispense: 90 tablet; Refill: 3 - Renal  function panel  2. Urinary tract infection without hematuria, site unspecified Allergy work-up. - POCT urinalysis dipstick - Urine Culture  3. CAD in native artery Risk factors known untreated  4. Type 2 diabetes mellitus without complication, without long-term current use of insulin ([place])   5. Neuropathy   6.  Chronic anxiety A little worse recently since the death of her husband. 7.CKD Worsening  I have done the exam and reviewed the chart and it is accurate to the best of my knowledge. Development worker, community has been used and  any errors in dictation or transcription are unintentional. Miguel Aschoff M.D. [place], MD  [facility]

## 2018-03-25 LAB — RENAL FUNCTION PANEL
Albumin: 4.3 g/dL (ref 3.6–4.6)
BUN/Creatinine Ratio: 15 (ref 12–28)
BUN: 26 mg/dL (ref 8–27)
CO2: 23 mmol/L (ref 20–29)
CREATININE: 1.71 mg/dL — AB (ref 0.57–1.00)
Calcium: 9.8 mg/dL (ref 8.7–10.3)
Chloride: 103 mmol/L (ref 96–106)
GFR calc Af Amer: 32 mL/min/{1.73_m2} — ABNORMAL LOW (ref >59)
GFR calc non Af Amer: 27 mL/min/{1.73_m2} — ABNORMAL LOW (ref >59)
Glucose: 125 mg/dL — ABNORMAL HIGH (ref 65–99)
Phosphorus: 4 mg/dL (ref 3.0–4.3)
Potassium: 4.6 mmol/L (ref 3.5–5.2)
Sodium: 141 mmol/L (ref 134–144)

## 2018-03-26 LAB — URINE CULTURE: Organism ID, Bacteria: NO GROWTH

## 2018-03-28 ENCOUNTER — Telehealth: Payer: Self-pay

## 2018-03-28 NOTE — Telephone Encounter (Signed)
Patient was advised.  

## 2018-03-28 NOTE — Telephone Encounter (Signed)
Patient was advised and states she will follow-up with her nephrology.

## 2018-03-28 NOTE — Telephone Encounter (Signed)
-----   Message from Jerrol Banana., MD sent at 03/25/2018 12:46 PM EST ----- Kidney function stable but slightly worse.  Keep follow-up with nephrology.

## 2018-03-28 NOTE — Telephone Encounter (Signed)
-----   Message from Jerrol Banana., MD sent at 03/26/2018 12:37 PM EST ----- No UTI

## 2018-04-01 ENCOUNTER — Other Ambulatory Visit: Payer: Self-pay | Admitting: Family Medicine

## 2018-04-01 DIAGNOSIS — F5109 Other insomnia not due to a substance or known physiological condition: Secondary | ICD-10-CM

## 2018-04-13 ENCOUNTER — Ambulatory Visit (INDEPENDENT_AMBULATORY_CARE_PROVIDER_SITE_OTHER): Payer: Medicare Other

## 2018-04-13 ENCOUNTER — Other Ambulatory Visit (INDEPENDENT_AMBULATORY_CARE_PROVIDER_SITE_OTHER): Payer: Self-pay | Admitting: Family Medicine

## 2018-04-13 ENCOUNTER — Other Ambulatory Visit (INDEPENDENT_AMBULATORY_CARE_PROVIDER_SITE_OTHER): Payer: Self-pay | Admitting: Nephrology

## 2018-04-13 DIAGNOSIS — I10 Essential (primary) hypertension: Secondary | ICD-10-CM

## 2018-04-13 DIAGNOSIS — N186 End stage renal disease: Secondary | ICD-10-CM

## 2018-04-14 ENCOUNTER — Encounter: Payer: Self-pay | Admitting: Family Medicine

## 2018-04-14 ENCOUNTER — Ambulatory Visit (INDEPENDENT_AMBULATORY_CARE_PROVIDER_SITE_OTHER): Payer: Medicare Other | Admitting: Family Medicine

## 2018-04-14 VITALS — BP 144/82 | HR 64 | Temp 98.7°F | Resp 16 | Ht 67.0 in | Wt 173.0 lb

## 2018-04-14 DIAGNOSIS — I2510 Atherosclerotic heart disease of native coronary artery without angina pectoris: Secondary | ICD-10-CM

## 2018-04-14 DIAGNOSIS — I10 Essential (primary) hypertension: Secondary | ICD-10-CM | POA: Diagnosis not present

## 2018-04-14 DIAGNOSIS — F5109 Other insomnia not due to a substance or known physiological condition: Secondary | ICD-10-CM | POA: Diagnosis not present

## 2018-04-14 DIAGNOSIS — E119 Type 2 diabetes mellitus without complications: Secondary | ICD-10-CM | POA: Diagnosis not present

## 2018-04-14 NOTE — Progress Notes (Signed)
Patient: Valerie Foster Female    DOB: 1933-10-28   85 y.o.   MRN: [id number] Visit Date: 04/14/2018  Today's Provider: [name], MD   Chief Complaint  Patient presents with  . Hypertension   Subjective:     HPI Patient comes in today for a follow up. She was last seen in the office 3 weeks ago. On last OV, patient was advised to increase amlodipine from 2.5mg  to 5mg  daily. She reports that on average her BP are averaging in 103s-140s/70s. She does notice that her BP tends to elevate around 5pm in the evenings around 160s/80s.  BP Readings from Last 3 Encounters:  04/14/18 (!) 144/82  03/24/18 (!) 160/90  03/10/18 (!) 162/84   Patient was also prescribed Trazodone 50mg  to help her sleep and she reports that she is still not sleeping well.   Allergies  Allergen Reactions  . Statins     Joint and muscle pain severe  . Sulfa Antibiotics      Current Outpatient Medications:  .  amLODipine (NORVASC) 5 MG tablet, Take 0.5 tablets (2.5 mg total) by mouth daily., Disp: 90 tablet, Rfl: 3 .  aspirin 81 MG tablet, Take 81 mg by mouth daily. , Disp: , Rfl:  .  cloNIDine (CATAPRES) 0.1 MG tablet, TAKE 1 TABLET (0.1 MG TOTAL) BY MOUTH 3 (THREE) TIMES DAILY., Disp: 270 tablet, Rfl: 3 .  cyclobenzaprine (FLEXERIL) 10 MG tablet, Take 1 tablet (10 mg total) by mouth at bedtime. (Patient taking differently: Take 10 mg by mouth at bedtime. ), Disp: 30 tablet, Rfl: 5 .  diltiazem (DILACOR XR) 120 MG 24 hr capsule, Take 1 capsule (120 mg total) by mouth daily., Disp: 90 capsule, Rfl: 3 .  ezetimibe (ZETIA) 10 MG tablet, Take 10 mg by mouth daily. , Disp: , Rfl:  .  fluticasone (FLONASE) 50 MCG/ACT nasal spray, Place 2 sprays daily into both nostrils. (Patient taking differently: Place 2 sprays into both nostrils daily. ), Disp: 16 g, Rfl: 12 .  glucose blood test strip, Use as instructed, Disp: 100 each, Rfl: 12 .  LORazepam (ATIVAN) 0.5 MG tablet, Take 1 tablet (0.5 mg total) by  mouth 2 (two) times daily as needed for anxiety., Disp: 60 tablet, Rfl: 4 .  meloxicam (MOBIC) 7.5 MG tablet, Take 7.5 mg by mouth daily. , Disp: , Rfl:  .  metFORMIN (GLUCOPHAGE) 500 MG tablet, TAKE 1 TABLET BY MOUTH EVERY DAY, Disp: 90 tablet, Rfl: 3 .  metoprolol succinate (TOPROL-XL) 50 MG 24 hr tablet, TAKE 1 TABLET BY MOUTH EVERY DAY, Disp: 90 tablet, Rfl: 3 .  olmesartan (BENICAR) 40 MG tablet, TAKE 1 TABLET BY MOUTH EVERY DAY, Disp: 90 tablet, Rfl: 4 .  pravastatin (PRAVACHOL) 10 MG tablet, Take by mouth., Disp: , Rfl:  .  sodium chloride (OCEAN) 0.65 % SOLN nasal spray, Place 2 sprays into both nostrils every 2 (two) hours while awake., Disp: , Rfl: 0 .  tolterodine (DETROL LA) 4 MG 24 hr capsule, Take 1 capsule (4 mg total) by mouth daily., Disp: 30 capsule, Rfl: 3 .  traZODone (DESYREL) 50 MG tablet, TAKE 1 TABLET BY MOUTH EVERYDAY AT BEDTIME, Disp: 90 tablet, Rfl: 2 .  cetirizine (ZYRTEC) 10 MG tablet, Take 1 tablet (10 mg total) by mouth daily. (Patient taking differently: Take 10 mg by mouth daily. ), Disp: , Rfl:  .  ciprofloxacin (CIPRO) 250 MG tablet, Take 1 tablet (250 mg total) by mouth  2 (two) times daily. (Patient not taking: Reported on 04/14/2018), Disp: 10 tablet, Rfl: 0 .  hydrALAZINE (APRESOLINE) 25 MG tablet, Take 25 mg by mouth 3 (three) times daily., Disp: , Rfl:  .  hydrochlorothiazide (HYDRODIURIL) 25 MG tablet, TAKE 1 TABLET BY MOUTH EVERY DAY (Patient not taking: Reported on 03/24/2018), Disp: 90 tablet, Rfl: 3  Review of Systems  Constitutional: Positive for fatigue. Negative for activity change and appetite change.  HENT: Negative.   Eyes: Negative.   Respiratory: Negative for cough and shortness of breath.   Cardiovascular: Negative for chest pain and leg swelling.  Gastrointestinal: Negative.   Endocrine: Negative.   Musculoskeletal: Negative for arthralgias.  Allergic/Immunologic: Negative.   Neurological: Negative for dizziness, light-headedness and  headaches.  Psychiatric/Behavioral: Positive for sleep disturbance. Negative for agitation. The patient is nervous/anxious.     Social History   Tobacco Use  . Smoking status: Never Smoker  . Smokeless tobacco: Never Used  Substance Use Topics  . Alcohol use: No      Objective:   BP (!) 144/82 (BP Location: Left Arm, Patient Position: Sitting, Cuff Size: Normal)   Pulse 64   Temp 98.7 F (37.1 C)   Resp 16   Ht 5\' 7"  (1.702 m)   Wt 173 lb (78.5 kg)   BMI 27.10 kg/m  Vitals:   04/14/18 0944  BP: (!) 144/82  Pulse: 64  Resp: 16  Temp: 98.7 F (37.1 C)  Weight: 173 lb (78.5 kg)  Height: 5\' 7"  (1.702 m)     Physical Exam Constitutional:      Appearance: She is well-developed.  HENT:     Head: Normocephalic and atraumatic.     Right Ear: External ear normal.     Left Ear: External ear normal.     Nose: Nose normal.  Eyes:     General: No scleral icterus.    Conjunctiva/sclera: Conjunctivae normal.  Neck:     Thyroid: No thyromegaly.  Cardiovascular:     Rate and Rhythm: Normal rate and regular rhythm.     Heart sounds: Normal heart sounds.  Pulmonary:     Effort: Pulmonary effort is normal.     Breath sounds: Normal breath sounds.  Chest:     Chest wall: No mass or tenderness.     Breasts:        Right: No mass.        Left: No mass.  Abdominal:     Palpations: Abdomen is soft.  Skin:    General: Skin is warm and dry.  Neurological:     General: No focal deficit present.     Mental Status: She is alert and oriented to person, place, and time.  Psychiatric:        Mood and Affect: Mood normal.        Behavior: Behavior normal.        Thought Content: Thought content normal.        Judgment: Judgment normal.         Assessment & Plan    1. Essential hypertension Amlodipine in the morning, metoprolol and afternoon, Benicar in the evening.  2. CAD in native artery Risk factors treated.  3. Type 2 diabetes mellitus without complication,  without long-term current use of insulin ([place])   4. Insomnia, matutinal Increase trazodone from 50 to 100 mg nightly.     I have done the exam and reviewed the above chart and it is accurate to the best of  my knowledge. Development worker, community has been used in this note in any air is in the dictation or transcription are unintentional.  [name], MD  [place]

## 2018-04-14 NOTE — Patient Instructions (Signed)
Take Amlodipine 5mg  in the morning.  Take Metoprolol in the afternoon.  Take Benicar in the evenings.

## 2018-04-22 ENCOUNTER — Other Ambulatory Visit: Payer: Self-pay | Admitting: Family Medicine

## 2018-04-29 ENCOUNTER — Other Ambulatory Visit: Payer: Self-pay

## 2018-04-29 DIAGNOSIS — I10 Essential (primary) hypertension: Secondary | ICD-10-CM

## 2018-04-29 MED ORDER — AMLODIPINE BESYLATE 5 MG PO TABS: 5.0000 mg | ORAL_TABLET | Freq: Every day | ORAL | 0 refills | Status: AC

## 2018-04-29 NOTE — Telephone Encounter (Signed)
New Rx reflecting to take 1 whole pill sent to [facility]

## 2018-04-29 NOTE — Telephone Encounter (Signed)
This is a patient of Dr. Marlan Palau, patient states that Dr. Rosanna Randy recently increased dose of Amlodipine to 5mg . Patient reports that pharmacy did not have it at 5mg  and has been taking two 2.5mg  daily, patient states that she contacted her pharmacy this morning to get refill and they advised her that it was too soon to get refilled. Patient states that she believes that it is being denied because pharmacy doesn't know that she is take two tablets daily instead of one. Patient is asking for clarification and for prescription to be filled and sent to [place] [facility]. KW

## 2018-05-16 ENCOUNTER — Inpatient Hospital Stay
Admission: EM | Admit: 2018-05-16 | Discharge: 2018-05-17 | DRG: 270 | Disposition: A | Discharge status: Other Acute Inpatient Hospital | Payer: Medicare Other | Attending: Internal Medicine | Admitting: Internal Medicine

## 2018-05-16 ENCOUNTER — Emergency Department: Payer: Medicare Other

## 2018-05-16 ENCOUNTER — Other Ambulatory Visit: Payer: Self-pay

## 2018-05-16 DIAGNOSIS — I2510 Atherosclerotic heart disease of native coronary artery without angina pectoris: Secondary | ICD-10-CM | POA: Diagnosis present

## 2018-05-16 DIAGNOSIS — R32 Unspecified urinary incontinence: Secondary | ICD-10-CM | POA: Diagnosis present

## 2018-05-16 DIAGNOSIS — R570 Cardiogenic shock: Secondary | ICD-10-CM | POA: Diagnosis not present

## 2018-05-16 DIAGNOSIS — E785 Hyperlipidemia, unspecified: Secondary | ICD-10-CM | POA: Diagnosis present

## 2018-05-16 DIAGNOSIS — R110 Nausea: Secondary | ICD-10-CM | POA: Diagnosis not present

## 2018-05-16 DIAGNOSIS — R079 Chest pain, unspecified: Secondary | ICD-10-CM

## 2018-05-16 DIAGNOSIS — Z515 Encounter for palliative care: Secondary | ICD-10-CM | POA: Diagnosis not present

## 2018-05-16 DIAGNOSIS — Z7189 Other specified counseling: Secondary | ICD-10-CM | POA: Diagnosis not present

## 2018-05-16 DIAGNOSIS — R457 State of emotional shock and stress, unspecified: Secondary | ICD-10-CM | POA: Diagnosis not present

## 2018-05-16 DIAGNOSIS — R7989 Other specified abnormal findings of blood chemistry: Secondary | ICD-10-CM | POA: Diagnosis not present

## 2018-05-16 DIAGNOSIS — I469 Cardiac arrest, cause unspecified: Secondary | ICD-10-CM | POA: Diagnosis not present

## 2018-05-16 DIAGNOSIS — I214 Non-ST elevation (NSTEMI) myocardial infarction: Secondary | ICD-10-CM

## 2018-05-16 DIAGNOSIS — Z833 Family history of diabetes mellitus: Secondary | ICD-10-CM

## 2018-05-16 DIAGNOSIS — R1111 Vomiting without nausea: Secondary | ICD-10-CM | POA: Diagnosis not present

## 2018-05-16 DIAGNOSIS — Z7951 Long term (current) use of inhaled steroids: Secondary | ICD-10-CM

## 2018-05-16 DIAGNOSIS — F419 Anxiety disorder, unspecified: Secondary | ICD-10-CM | POA: Diagnosis present

## 2018-05-16 DIAGNOSIS — I129 Hypertensive chronic kidney disease with stage 1 through stage 4 chronic kidney disease, or unspecified chronic kidney disease: Secondary | ICD-10-CM | POA: Diagnosis present

## 2018-05-16 DIAGNOSIS — I232 Ventricular septal defect as current complication following acute myocardial infarction: Secondary | ICD-10-CM | POA: Diagnosis not present

## 2018-05-16 DIAGNOSIS — N179 Acute kidney failure, unspecified: Secondary | ICD-10-CM | POA: Diagnosis present

## 2018-05-16 DIAGNOSIS — G9340 Encephalopathy, unspecified: Secondary | ICD-10-CM | POA: Diagnosis not present

## 2018-05-16 DIAGNOSIS — Z7982 Long term (current) use of aspirin: Secondary | ICD-10-CM

## 2018-05-16 DIAGNOSIS — Z79899 Other long term (current) drug therapy: Secondary | ICD-10-CM | POA: Diagnosis not present

## 2018-05-16 DIAGNOSIS — E1122 Type 2 diabetes mellitus with diabetic chronic kidney disease: Secondary | ICD-10-CM | POA: Diagnosis present

## 2018-05-16 DIAGNOSIS — Z8249 Family history of ischemic heart disease and other diseases of the circulatory system: Secondary | ICD-10-CM

## 2018-05-16 DIAGNOSIS — E7800 Pure hypercholesterolemia, unspecified: Secondary | ICD-10-CM | POA: Diagnosis present

## 2018-05-16 DIAGNOSIS — Z7984 Long term (current) use of oral hypoglycemic drugs: Secondary | ICD-10-CM | POA: Diagnosis not present

## 2018-05-16 DIAGNOSIS — I10 Essential (primary) hypertension: Secondary | ICD-10-CM | POA: Diagnosis not present

## 2018-05-16 DIAGNOSIS — I2109 ST elevation (STEMI) myocardial infarction involving other coronary artery of anterior wall: Secondary | ICD-10-CM | POA: Diagnosis not present

## 2018-05-16 DIAGNOSIS — N189 Chronic kidney disease, unspecified: Secondary | ICD-10-CM | POA: Diagnosis present

## 2018-05-16 DIAGNOSIS — Z955 Presence of coronary angioplasty implant and graft: Secondary | ICD-10-CM | POA: Diagnosis not present

## 2018-05-16 DIAGNOSIS — J9601 Acute respiratory failure with hypoxia: Secondary | ICD-10-CM | POA: Diagnosis not present

## 2018-05-16 DIAGNOSIS — I499 Cardiac arrhythmia, unspecified: Secondary | ICD-10-CM | POA: Diagnosis not present

## 2018-05-16 DIAGNOSIS — I2102 ST elevation (STEMI) myocardial infarction involving left anterior descending coronary artery: Secondary | ICD-10-CM | POA: Diagnosis not present

## 2018-05-16 LAB — CBC WITH DIFFERENTIAL/PLATELET
ABS IMMATURE GRANULOCYTES: 0.04 10*3/uL (ref 0.00–0.07)
Basophils Absolute: 0.1 10*3/uL (ref 0.0–0.1)
Basophils Relative: 1 %
EOS ABS: 0.2 10*3/uL (ref 0.0–0.5)
Eosinophils Relative: 2 %
HCT: 42.1 % (ref 36.0–46.0)
Hemoglobin: 13.2 g/dL (ref 12.0–15.0)
IMMATURE GRANULOCYTES: 0 %
Lymphocytes Relative: 18 %
Lymphs Abs: 1.7 10*3/uL (ref 0.7–4.0)
MCH: 26.9 pg (ref 26.0–34.0)
MCHC: 31.4 g/dL (ref 30.0–36.0)
MCV: 85.9 fL (ref 80.0–100.0)
Monocytes Absolute: 0.6 10*3/uL (ref 0.1–1.0)
Monocytes Relative: 6 %
NEUTROS PCT: 73 %
Neutro Abs: 6.9 10*3/uL (ref 1.7–7.7)
Platelets: 299 10*3/uL (ref 150–400)
RBC: 4.9 MIL/uL (ref 3.87–5.11)
RDW: 13 % (ref 11.5–15.5)
WBC: 9.5 10*3/uL (ref 4.0–10.5)
nRBC: 0 % (ref 0.0–0.2)

## 2018-05-16 LAB — APTT: aPTT: >160 seconds (ref 24–36)

## 2018-05-16 LAB — BASIC METABOLIC PANEL
Anion gap: 9 (ref 5–15)
BUN: 29 mg/dL — AB (ref 8–23)
CO2: 25 mmol/L (ref 22–32)
Calcium: 9 mg/dL (ref 8.9–10.3)
Chloride: 104 mmol/L (ref 98–111)
Creatinine, Ser: 1.7 mg/dL — ABNORMAL HIGH (ref 0.44–1.00)
GFR calc Af Amer: 32 mL/min — ABNORMAL LOW (ref >60)
GFR calc non Af Amer: 27 mL/min — ABNORMAL LOW (ref >60)
Glucose, Bld: 139 mg/dL — ABNORMAL HIGH (ref 70–99)
Potassium: 3.7 mmol/L (ref 3.5–5.1)
Sodium: 138 mmol/L (ref 135–145)

## 2018-05-16 LAB — MRSA PCR SCREENING: MRSA by PCR: NEGATIVE

## 2018-05-16 LAB — PROTIME-INR
INR: 1 (ref 0.8–1.2)
Prothrombin Time: 13.5 seconds (ref 11.4–15.2)

## 2018-05-16 LAB — TROPONIN I: Troponin I: 0.04 ng/mL (ref <0.03)

## 2018-05-16 LAB — GLUCOSE, CAPILLARY: Glucose-Capillary: 150 mg/dL — ABNORMAL HIGH (ref 70–99)

## 2018-05-16 MED ORDER — KETOROLAC TROMETHAMINE 30 MG/ML IJ SOLN
15.0000 mg | Freq: Once | INTRAMUSCULAR | Status: AC
  Administered 2018-05-16: 15 mg via INTRAVENOUS
  Filled 2018-05-16: qty 1

## 2018-05-16 MED ORDER — EZETIMIBE 10 MG PO TABS
10.0000 mg | ORAL_TABLET | Freq: Every day | ORAL | Status: DC
  Filled 2018-05-16 (×2): qty 1

## 2018-05-16 MED ORDER — MORPHINE SULFATE (PF) 2 MG/ML IV SOLN
1.0000 mg | INTRAVENOUS | Status: DC | PRN
  Administered 2018-05-17: 2 mg via INTRAVENOUS
  Filled 2018-05-16: qty 1

## 2018-05-16 MED ORDER — PROMETHAZINE HCL 25 MG/ML IJ SOLN
6.2500 mg | Freq: Four times a day (QID) | INTRAMUSCULAR | Status: DC | PRN
  Administered 2018-05-16: 6.25 mg via INTRAVENOUS
  Filled 2018-05-16: qty 1

## 2018-05-16 MED ORDER — PRAVASTATIN SODIUM 20 MG PO TABS
10.0000 mg | ORAL_TABLET | Freq: Every day | ORAL | Status: DC
  Filled 2018-05-16: qty 1

## 2018-05-16 MED ORDER — CHLORHEXIDINE GLUCONATE CLOTH 2 % EX PADS
6.0000 | MEDICATED_PAD | Freq: Every day | CUTANEOUS | Status: DC
  Administered 2018-05-16: 6 via TOPICAL

## 2018-05-16 MED ORDER — HEPARIN BOLUS VIA INFUSION
4000.0000 [IU] | Freq: Once | INTRAVENOUS | Status: AC
  Administered 2018-05-16: 4000 [IU] via INTRAVENOUS
  Filled 2018-05-16: qty 4000

## 2018-05-16 MED ORDER — ACETAMINOPHEN 650 MG RE SUPP: 650.0000 mg | Freq: Four times a day (QID) | RECTAL | Status: DC | PRN

## 2018-05-16 MED ORDER — MORPHINE SULFATE (PF) 4 MG/ML IV SOLN
4.0000 mg | Freq: Once | INTRAVENOUS | Status: AC
  Administered 2018-05-16: 4 mg via INTRAVENOUS
  Filled 2018-05-16: qty 1

## 2018-05-16 MED ORDER — ASPIRIN EC 81 MG PO TBEC
81.0000 mg | DELAYED_RELEASE_TABLET | Freq: Every day | ORAL | Status: DC
  Filled 2018-05-16: qty 1

## 2018-05-16 MED ORDER — LORAZEPAM 0.5 MG PO TABS: 0.5000 mg | ORAL_TABLET | Freq: Two times a day (BID) | ORAL | Status: DC | PRN

## 2018-05-16 MED ORDER — DILTIAZEM HCL ER COATED BEADS 120 MG PO CP24
120.0000 mg | ORAL_CAPSULE | Freq: Every day | ORAL | Status: DC
  Filled 2018-05-16 (×2): qty 1

## 2018-05-16 MED ORDER — INSULIN ASPART 100 UNIT/ML ~~LOC~~ SOLN: 0.0000 [IU] | Freq: Three times a day (TID) | SUBCUTANEOUS | Status: DC

## 2018-05-16 MED ORDER — TRAZODONE HCL 50 MG PO TABS
50.0000 mg | ORAL_TABLET | Freq: Every day | ORAL | Status: DC
  Administered 2018-05-16: 100 mg via ORAL
  Filled 2018-05-16: qty 2

## 2018-05-16 MED ORDER — ONDANSETRON HCL 4 MG PO TABS: 4.0000 mg | ORAL_TABLET | Freq: Four times a day (QID) | ORAL | Status: DC | PRN

## 2018-05-16 MED ORDER — IOHEXOL 350 MG/ML SOLN
60.0000 mL | Freq: Once | INTRAVENOUS | Status: AC | PRN
  Administered 2018-05-16: 60 mL via INTRAVENOUS

## 2018-05-16 MED ORDER — ONDANSETRON HCL 4 MG/2ML IJ SOLN
4.0000 mg | Freq: Once | INTRAMUSCULAR | Status: AC
  Administered 2018-05-16: 4 mg via INTRAVENOUS
  Filled 2018-05-16: qty 2

## 2018-05-16 MED ORDER — NITROGLYCERIN 0.4 MG SL SUBL
0.4000 mg | SUBLINGUAL_TABLET | SUBLINGUAL | Status: DC | PRN
  Administered 2018-05-16 (×3): 0.4 mg via SUBLINGUAL
  Filled 2018-05-16 (×3): qty 1

## 2018-05-16 MED ORDER — AMLODIPINE BESYLATE 5 MG PO TABS
5.0000 mg | ORAL_TABLET | Freq: Every day | ORAL | Status: DC
  Filled 2018-05-16: qty 1

## 2018-05-16 MED ORDER — HYDRALAZINE HCL 50 MG PO TABS
25.0000 mg | ORAL_TABLET | Freq: Three times a day (TID) | ORAL | Status: DC
  Filled 2018-05-16: qty 1

## 2018-05-16 MED ORDER — INSULIN ASPART 100 UNIT/ML ~~LOC~~ SOLN: 0.0000 [IU] | Freq: Every day | SUBCUTANEOUS | Status: DC

## 2018-05-16 MED ORDER — IRBESARTAN 150 MG PO TABS
300.0000 mg | ORAL_TABLET | Freq: Every day | ORAL | Status: DC
  Filled 2018-05-16: qty 2

## 2018-05-16 MED ORDER — HYDROCODONE-ACETAMINOPHEN 5-325 MG PO TABS
1.0000 | ORAL_TABLET | Freq: Four times a day (QID) | ORAL | Status: DC | PRN
  Administered 2018-05-16: 2 via ORAL
  Filled 2018-05-16: qty 2

## 2018-05-16 MED ORDER — FESOTERODINE FUMARATE ER 4 MG PO TB24
4.0000 mg | ORAL_TABLET | Freq: Every day | ORAL | Status: DC
  Filled 2018-05-16 (×2): qty 1

## 2018-05-16 MED ORDER — METOPROLOL SUCCINATE ER 50 MG PO TB24
50.0000 mg | ORAL_TABLET | Freq: Every day | ORAL | Status: DC
  Filled 2018-05-16: qty 1

## 2018-05-16 MED ORDER — HEPARIN (PORCINE) 25000 UT/250ML-% IV SOLN
850.0000 [IU]/h | INTRAVENOUS | Status: DC
  Administered 2018-05-16: 850 [IU]/h via INTRAVENOUS
  Filled 2018-05-16 (×3): qty 250

## 2018-05-16 MED ORDER — SODIUM CHLORIDE 0.9 % IV BOLUS
1000.0000 mL | Freq: Once | INTRAVENOUS | Status: AC
  Administered 2018-05-16: 1000 mL via INTRAVENOUS

## 2018-05-16 MED ORDER — NITROGLYCERIN IN D5W 200-5 MCG/ML-% IV SOLN
0.0000 ug/min | INTRAVENOUS | Status: DC
  Administered 2018-05-16: 5 ug/min via INTRAVENOUS
  Filled 2018-05-16: qty 250

## 2018-05-16 MED ORDER — NITROGLYCERIN 0.4 MG SL SUBL
SUBLINGUAL_TABLET | SUBLINGUAL | Status: AC
  Administered 2018-05-16: 0.4 mg via SUBLINGUAL
  Filled 2018-05-16: qty 1

## 2018-05-16 MED ORDER — ACETAMINOPHEN 325 MG PO TABS: 650.0000 mg | ORAL_TABLET | Freq: Four times a day (QID) | ORAL | Status: DC | PRN

## 2018-05-16 MED ORDER — ONDANSETRON HCL 4 MG/2ML IJ SOLN
4.0000 mg | Freq: Four times a day (QID) | INTRAMUSCULAR | Status: DC | PRN
  Administered 2018-05-16 – 2018-05-17 (×2): 4 mg via INTRAVENOUS
  Filled 2018-05-16 (×2): qty 2

## 2018-05-16 NOTE — Consult Note (Addendum)
Name: Valerie Foster MRN: [id number] DOB: [birth year]    ADMISSION DATE:  05/16/2018 CONSULTATION DATE: 05/16/2018  REFERRING MD : Dr. Verdell Carmine   CHIEF COMPLAINT: Chest Pain   BRIEF PATIENT DESCRIPTION:  85 yo female admitted with angina requiring nitroglycerin gtt   SIGNIFICANT EVENTS/STUDIES:  03/23-Pt admitted to the stepdown unit  03/23-CTA Chest revealed no acute cardiopulmonary abnormality. Stent noted within the LAD. Thoracic spondylosis. Osteoarthritis of the sternoclavicular joints, right greater than left. Stable bilateral adrenal adenomas. Aortic Atherosclerosis (ICD10-I70.0).  HISTORY OF PRESENT ILLNESS:   This is an 85 yo female with a PMH of Palpitations, HTN, Hypercholesteremia, CAD, Dysrhythmias, Diverticulitis, Type II Diabetes Mellitus, Anginal Pain, and Arthritis.  She presented to [facility] ER on 03/23 via EMS from home with c/o intermittent central chest pain with radiation to her back and bilateral arms onset the afternoon of 03/23.  Per ER notes the pt stated she lifted weights for the first time on 03/23.  She also endorsed feeling clammy and nauseated.  EMS administered aspirin and zofran.  In the ER initial and repeat EKG revealed mild ST elevations in anterior leads with no reciprocal changes, pt did not meet STEMI criteria.  She received sublingual nitroglycerin with resolution of chest pain, however she continued to c/o upper back pain.  CTA Chest negative for acute cardiopulmonary abnormality, however initial troponin 0.04.  Pt ruled in for possible NSTEMI and nitroglycerin gtt initiated for chest pain.  She was subsequently admitted to the stepdown unit by hospitalist team for additional workup and treatment.    PAST MEDICAL HISTORY :   has a past medical history of Anginal pain ([place]), Arthritis, Coronary artery disease, Diabetes mellitus without complication ([place]), Diverticulitis, Dysrhythmia, Heart disease, Hypercholesteremia, Hypertension, and Palpitations.  has a past  surgical history that includes Coronary stent placement (06/17/2011); Finger surgery; Tonsillectomy; Cardiac surgery; Cataract extraction w/PHACO (Left, 01/28/2015); Coronary angioplasty; and Cataract extraction w/PHACO (Right, 03/18/2015). Prior to Admission medications   Medication Sig Start Date End Date Taking? Authorizing Provider  amLODipine (NORVASC) 5 MG tablet Take 1 tablet (5 mg total) by mouth daily. 04/29/18  Yes Bacigalupo, Dionne Bucy, MD  aspirin 81 MG tablet Take 81 mg by mouth daily.    Yes [provider]  cetirizine (ZYRTEC) 10 MG tablet Take 1 tablet (10 mg total) by mouth daily. Patient taking differently: Take 10 mg by mouth daily as needed for allergies or rhinitis.  08/18/14  Yes Betancourt, Aura Fey, NP  diltiazem (DILACOR XR) 120 MG 24 hr capsule Take 1 capsule (120 mg total) by mouth daily. 02/03/18  Yes Jerrol Banana., MD  fluticasone [facility]) 50 MCG/ACT nasal spray Place 2 sprays daily into both nostrils. Patient taking differently: Place 2 sprays into both nostrils daily as needed for allergies or rhinitis.  01/06/17  Yes Jerrol Banana., MD  glucose blood test strip Use as instructed 01/04/18  Yes Jerrol Banana., MD  hydrALAZINE (APRESOLINE) 25 MG tablet Take 25 mg by mouth 3 (three) times daily.   Yes [provider]  LORazepam (ATIVAN) 0.5 MG tablet Take 1 tablet (0.5 mg total) by mouth 2 (two) times daily as needed for anxiety. 02/03/18  Yes Jerrol Banana., MD  metoprolol succinate (TOPROL-XL) 50 MG 24 hr tablet TAKE 1 TABLET BY MOUTH EVERY DAY Patient taking differently: Take 50 mg by mouth daily.  09/23/17  Yes Jerrol Banana., MD  olmesartan (BENICAR) 40 MG tablet TAKE 1 TABLET BY  MOUTH EVERY DAY Patient taking differently: Take 40 mg by mouth every evening.  03/01/18  Yes Jerrol Banana., MD  sodium chloride (OCEAN) 0.65 % SOLN nasal spray Place 2 sprays into both nostrils every 2 (two) hours while  awake. Patient taking differently: Place 2 sprays into both nostrils as needed for congestion.  08/18/14  Yes Betancourt, Aura Fey, NP  traZODone (DESYREL) 50 MG tablet TAKE 1 TABLET BY MOUTH EVERYDAY AT BEDTIME Patient taking differently: Take 50-100 mg by mouth at bedtime.  04/05/18  Yes Jerrol Banana., MD  ciprofloxacin (CIPRO) 250 MG tablet Take 1 tablet (250 mg total) by mouth 2 (two) times daily. Patient not taking: Reported on 04/14/2018 03/24/18   Jerrol Banana., MD  cloNIDine (CATAPRES) 0.1 MG tablet TAKE 1 TABLET (0.1 MG TOTAL) BY MOUTH 3 (THREE) TIMES DAILY. Patient not taking: Reported on 05/16/2018 06/07/17   Jerrol Banana., MD  cyclobenzaprine (FLEXERIL) 10 MG tablet Take 1 tablet (10 mg total) by mouth at bedtime. Patient not taking: Reported on 05/16/2018 05/07/15   Jerrol Banana., MD  ezetimibe (ZETIA) 10 MG tablet TAKE 1 TABLET BY MOUTH EVERY DAY 04/22/18   Jerrol Banana., MD  hydrochlorothiazide (HYDRODIURIL) 25 MG tablet TAKE 1 TABLET BY MOUTH EVERY DAY Patient not taking: Reported on 03/24/2018 03/01/18   Jerrol Banana., MD  metFORMIN (GLUCOPHAGE) 500 MG tablet TAKE 1 TABLET BY MOUTH EVERY DAY Patient not taking: No sig reported 03/01/18   Jerrol Banana., MD  pravastatin (PRAVACHOL) 10 MG tablet TAKE 1 TABLET BY MOUTH EVERY DAY AT NIGHT 04/22/18   Jerrol Banana., MD  tolterodine (DETROL LA) 4 MG 24 hr capsule Take 1 capsule (4 mg total) by mouth daily. 03/20/16   Jerrol Banana., MD   Allergies  Allergen Reactions   Statins     Joint and muscle pain severe   Sulfa Antibiotics     FAMILY HISTORY:  family history includes Colon cancer in her sister; Diabetes in her mother; Heart attack in her father; Hypertension in her mother. SOCIAL HISTORY:  reports that she has never smoked. She has never used smokeless tobacco. She reports that she does not drink alcohol or use drugs.  REVIEW OF SYSTEMS: Positives in BOLD   Constitutional: Negative for fever, chills, weight loss, malaise/fatigue and diaphoresis.  HENT: Negative for hearing loss, ear pain, nosebleeds, congestion, sore throat, neck pain, tinnitus and ear discharge.   Eyes: Negative for blurred vision, double vision, photophobia, pain, discharge and redness.  Respiratory: Negative for cough, hemoptysis, sputum production, shortness of breath, wheezing and stridor.   Cardiovascular: chest pain, palpitations, orthopnea, claudication, leg swelling and PND.  Gastrointestinal: heartburn, nausea, vomiting, abdominal pain, diarrhea, constipation, blood in stool and melena.  Genitourinary: Negative for dysuria, urgency, frequency, hematuria and flank pain.  Musculoskeletal: myalgias, back pain, bilateral upper arm pain, joint pain and falls.  Skin: Negative for itching and rash.  Neurological: Negative for dizziness, tingling, tremors, sensory change, speech change, focal weakness, seizures, loss of consciousness, weakness and headaches.  Endo/Heme/Allergies: Negative for environmental allergies and polydipsia. Does not bruise/bleed easily.  SUBJECTIVE:  c/o back, bilateral upper arms, and chest pain   VITAL SIGNS: Temp:  [97.8 F (36.6 C)] 97.8 F (36.6 C) (03/23 1823) Pulse Rate:  [62-72] 70 (03/23 2030) Resp:  [14-22] 15 (03/23 2030) BP: (118-151)/(64-88) 146/84 (03/23 2030) SpO2:  [98 %-100 %] 98 % (03/23 2030)  Weight:  [77.1 kg] 77.1 kg (03/23 1824)  PHYSICAL EXAMINATION: General: well developed, weill nourished female NAD  Neuro: alert and oriented, follows commands  HEENT: supple, no JVD  Cardiovascular: nsr, rrr, no R/G  Lungs: clear throughout, even, non labored  Abdomen: +BS x4, soft, non tender, non distended  Musculoskeletal: normal bulk and tone, no edema  Skin: intact no rashes or lesions present   Recent Labs  Lab 05/16/18 1839  NA 138  K 3.7  CL 104  CO2 25  BUN 29*  CREATININE 1.70*  GLUCOSE 139*   Recent Labs  Lab  05/16/18 1839  HGB 13.2  HCT 42.1  WBC 9.5  PLT 299   Dg Chest Portable 1 View  Result Date: 05/16/2018 CLINICAL DATA:  Chest pain EXAM: PORTABLE CHEST 1 VIEW COMPARISON:  Jul 16, 2015 FINDINGS: There is no appreciable edema or consolidation. The heart size and pulmonary vascularity are normal. No adenopathy. No bone lesions. There is aortic atherosclerosis. IMPRESSION: No edema or consolidation. Heart size within normal limits. Aortic Atherosclerosis (ICD10-I70.0). Electronically Signed   By: Lowella Grip III M.D.   On: 05/16/2018 18:59   Ct Angio Chest Aorta W And/or Wo Contrast  Result Date: 05/16/2018 CLINICAL DATA:  Central chest pain radiating to the back and both arms. Patient was lifting weights this morning for the first time in a while. History of cardiac stent and hypertension. EXAM: CT ANGIOGRAPHY CHEST WITH CONTRAST TECHNIQUE: Multidetector CT imaging of the chest was performed using the standard protocol during bolus administration of intravenous contrast. Multiplanar CT image reconstructions and MIPs were obtained to evaluate the vascular anatomy. CONTRAST:  3mL OMNIPAQUE IOHEXOL 350 MG/ML SOLN COMPARISON:  MRI 08/15/2008 FINDINGS: Cardiovascular: LAD stent noted. Normal heart size without pericardial effusion or thickening. Aortic atherosclerosis without aneurysm. Probable soft plaque along the periphery of the right brachiocephalic artery, series 0/96 slightly narrowing the lumen. The unenhanced pulmonary vasculature is unremarkable. Mediastinum/Nodes: No enlarged mediastinal, hilar, or axillary lymph nodes. Small hiatal hernia. Thyroid gland, trachea, and esophagus demonstrate no significant findings. Lungs/Pleura: Dependent bibasilar atelectasis. No pneumothorax, effusion or pulmonary consolidations. No dominant mass. Upper Abdomen: Redemonstration of hypodense bilateral adrenal adenomas, largest on the right measuring up to 2 cm. Musculoskeletal: Thoracic spondylosis.  Osteoarthritis osteoarthritis of the sternoclavicular joints, right greater than left. Intact manubrium and sternum. No suspicious osseous lesions. Review of the MIP images confirms the above findings. IMPRESSION: 1. No acute cardiopulmonary abnormality. 2. Stent noted within the LAD. 3. Thoracic spondylosis. Osteoarthritis of the sternoclavicular joints, right greater than left. 4. Stable bilateral adrenal adenomas. Aortic Atherosclerosis (ICD10-I70.0). Electronically Signed   By: Ashley Royalty M.D.   On: 05/16/2018 19:55    ASSESSMENT / PLAN:  Angina secondary to possible NSTEMI  Elevated troponin secondary to NSTEMI vs. demand ischemia  Hx: CAD and HTN  Continuous telemetry monitoring  Trend troponin's  Continue heparin gtt  Nitroglycerin gtt and prn morphine for chest pain resolution  Continue outpatient antihypertensive, aspirin, pravastatin, and ezetimibe  Echo pending  Cardiology consulted appreciate input  Acute on chronic renal failure  Trend BMP  Replace electrolytes as indicated  Monitor UOP  Avoid nephrotoxic medications  Type II Diabetes Mellitus  CBG's ac/hs  SSI  Marda Stalker, [place] Pager [phone] (please enter 7 digits) PCCM Consult Pager [phone] (please enter 7 digits)   Agree with Plan of care outlined by NP Acute STEMI Transferred to [facility] for further assessment     Maretta Bees  Patricia Pesa, M.D.  [facility]  Medical Director [place] Director [facility] Cardio-Pulmonary Department

## 2018-05-16 NOTE — ED Notes (Signed)
ED TO INPATIENT HANDOFF REPORT  ED Nurse Name and Phone #:  Larry Sierras  [phone]  S Name/Age/Gender Valerie Foster 85 y.o. female Room/Bed: ED02A/ED02A  Code Status   Code Status: Not on file  Home/SNF/Other Home Patient oriented to: self, place, time and situation Is this baseline? Yes   Triage Complete: Triage complete  Chief Complaint chest pain  Triage Note PT to Ed via  EMS from home. PT c/o central chest pain that radiates to back and both arms. Pt was lifting weights this morning for the first time in a while. HX of cardiac stent stent and HTN. Pt states she has been having night sweats for the past few nights. AO, no SOB, visibly in pain, grasping chest.    Allergies Allergies  Allergen Reactions  . Statins     Joint and muscle pain severe  . Sulfa Antibiotics     Level of Care/Admitting Diagnosis ED Disposition    ED Disposition Condition [facility] Area: [place] [[id number]]  Level of Care: Stepdown [14]  Diagnosis: Chest pain [[id number]]  Admitting Physician: Henreitta Leber [[id number]]  Attending Physician: Henreitta Leber [[id number]]  Estimated length of stay: past midnight tomorrow  Certification:: I certify this patient will need inpatient services for at least 2 midnights  PT Class (Do Not Modify): Inpatient [101]  PT Acc Code (Do Not Modify): Private [1]       B Medical/Surgery History Past Medical History:  Diagnosis Date  . Anginal pain ([place])   . Arthritis   . Coronary artery disease   . Diabetes mellitus without complication ([place])   . Diverticulitis   . Dysrhythmia   . Heart disease   . Hypercholesteremia   . Hypertension   . Palpitations    Past Surgical History:  Procedure Laterality Date  . CARDIAC SURGERY    . CATARACT EXTRACTION W/PHACO Left 01/28/2015   Procedure: CATARACT EXTRACTION PHACO AND INTRAOCULAR LENS PLACEMENT (IOC);  Surgeon: Estill Cotta, MD;  Location: [facility] ORS;  Service:  Ophthalmology;  Laterality: Left;  [place] 01:13 AP% 58.5 CDE 30.55 fluid pack lot # [id number]  . CATARACT EXTRACTION W/PHACO Right 03/18/2015   Procedure: CATARACT EXTRACTION PHACO AND INTRAOCULAR LENS PLACEMENT (IOC);  Surgeon: Estill Cotta, MD;  Location: [facility] ORS;  Service: Ophthalmology;  Laterality: Right;  [place]    1:08.3 AP%   24 CDE   30.33 fluid pack lot # [id number]  . CORONARY ANGIOPLASTY     stent  . CORONARY STENT PLACEMENT  06/17/2011  . FINGER SURGERY     for trigger finger  . TONSILLECTOMY       A IV Location/Drains/Wounds Patient Lines/Drains/Airways Status   Active Line/Drains/Airways    Name:   Placement date:   Placement time:   Site:   Days:   Peripheral IV 03/18/15 Left Arm   03/18/15    0803    Arm   1155   Peripheral IV 05/16/18 Right Antecubital   05/16/18    2030    Antecubital   less than 1   Airway   03/18/15    0856     1155   Incision (Closed) 01/28/15 Eye Left   01/28/15    0718     1204   Incision (Closed) 03/18/15 Eye Right   03/18/15    0901     1155          Intake/Output Last 24 hours  Intake/Output Summary (Last 24  hours) at 05/16/2018 2057 Last data filed at 05/16/2018 2057 Gross per 24 hour  Intake 43.46 ml  Output -  Net 43.46 ml    Labs/Imaging Results for orders placed or performed during the hospital encounter of 05/16/18 (from the past 48 hour(s))  CBC with Differential/Platelet     Status: None   Collection Time: 05/16/18  6:39 PM  Result Value Ref Range   WBC 9.5 4.0 - 10.5 K/uL   RBC 4.90 3.87 - 5.11 MIL/uL   Hemoglobin 13.2 12.0 - 15.0 g/dL   HCT 42.1 36.0 - 46.0 %   MCV 85.9 80.0 - 100.0 fL   MCH 26.9 26.0 - 34.0 pg   MCHC 31.4 30.0 - 36.0 g/dL   RDW 13.0 11.5 - 15.5 %   Platelets 299 150 - 400 K/uL   nRBC 0.0 0.0 - 0.2 %   Neutrophils Relative % 73 %   Neutro Abs 6.9 1.7 - 7.7 K/uL   Lymphocytes Relative 18 %   Lymphs Abs 1.7 0.7 - 4.0 K/uL   Monocytes Relative 6 %   Monocytes Absolute 0.6 0.1 - 1.0 K/uL    Eosinophils Relative 2 %   Eosinophils Absolute 0.2 0.0 - 0.5 K/uL   Basophils Relative 1 %   Basophils Absolute 0.1 0.0 - 0.1 K/uL   Immature Granulocytes 0 %   Abs Immature Granulocytes 0.04 0.00 - 0.07 K/uL    Comment: Performed at [facility], [address] [place], [place] [address]  Basic metabolic panel     Status: Abnormal   Collection Time: 05/16/18  6:39 PM  Result Value Ref Range   Sodium 138 135 - 145 mmol/L   Potassium 3.7 3.5 - 5.1 mmol/L   Chloride 104 98 - 111 mmol/L   CO2 25 22 - 32 mmol/L   Glucose, Bld 139 (H) 70 - 99 mg/dL   BUN 29 (H) 8 - 23 mg/dL   Creatinine, Ser 1.70 (H) 0.44 - 1.00 mg/dL   Calcium 9.0 8.9 - 10.3 mg/dL   GFR calc non Af Amer 27 (L) >60 mL/min   GFR calc Af Amer 32 (L) >60 mL/min   Anion gap 9 5 - 15    Comment: Performed at [facility], [address] [place], [place] [address]  Troponin I - ONCE - STAT     Status: Abnormal   Collection Time: 05/16/18  6:39 PM  Result Value Ref Range   Troponin I 0.04 (HH) <0.03 ng/mL    Comment: CRITICAL RESULT CALLED TO, READ BACK BY AND VERIFIED WITH: PAIGE JOHNSTON AT 1916 05/16/2018.  TFK Performed at [facility], [address] [place], [place] [address]    Dg Chest Portable 1 View  Result Date: 05/16/2018 CLINICAL DATA:  Chest pain EXAM: PORTABLE CHEST 1 VIEW COMPARISON:  Jul 16, 2015 FINDINGS: There is no appreciable edema or consolidation. The heart size and pulmonary vascularity are normal. No adenopathy. No bone lesions. There is aortic atherosclerosis. IMPRESSION: No edema or consolidation. Heart size within normal limits. Aortic Atherosclerosis (ICD10-I70.0). Electronically Signed   By: Lowella Grip III M.D.   On: 05/16/2018 18:59   Ct Angio Chest Aorta W And/or Wo Contrast  Result Date: 05/16/2018 CLINICAL DATA:  Central chest pain radiating to the back and both arms. Patient was lifting weights this morning for the first time in a while. History of  cardiac stent and hypertension. EXAM: CT ANGIOGRAPHY CHEST WITH CONTRAST TECHNIQUE: Multidetector CT imaging of  the chest was performed using the standard protocol during bolus administration of intravenous contrast. Multiplanar CT image reconstructions and MIPs were obtained to evaluate the vascular anatomy. CONTRAST:  56mL OMNIPAQUE IOHEXOL 350 MG/ML SOLN COMPARISON:  MRI 08/15/2008 FINDINGS: Cardiovascular: LAD stent noted. Normal heart size without pericardial effusion or thickening. Aortic atherosclerosis without aneurysm. Probable soft plaque along the periphery of the right brachiocephalic artery, series 1/95 slightly narrowing the lumen. The unenhanced pulmonary vasculature is unremarkable. Mediastinum/Nodes: No enlarged mediastinal, hilar, or axillary lymph nodes. Small hiatal hernia. Thyroid gland, trachea, and esophagus demonstrate no significant findings. Lungs/Pleura: Dependent bibasilar atelectasis. No pneumothorax, effusion or pulmonary consolidations. No dominant mass. Upper Abdomen: Redemonstration of hypodense bilateral adrenal adenomas, largest on the right measuring up to 2 cm. Musculoskeletal: Thoracic spondylosis. Osteoarthritis osteoarthritis of the sternoclavicular joints, right greater than left. Intact manubrium and sternum. No suspicious osseous lesions. Review of the MIP images confirms the above findings. IMPRESSION: 1. No acute cardiopulmonary abnormality. 2. Stent noted within the LAD. 3. Thoracic spondylosis. Osteoarthritis of the sternoclavicular joints, right greater than left. 4. Stable bilateral adrenal adenomas. Aortic Atherosclerosis (ICD10-I70.0). Electronically Signed   By: Ashley Royalty M.D.   On: 05/16/2018 19:55    Pending Labs Unresulted Labs (From admission, onward)    Start     Ordered   04/29/2018 0400  Heparin level (unfractionated)  Once-Timed,   STAT     05/16/18 1926   05/16/18 1927  Protime-INR  ONCE - STAT,   STAT     05/16/18 1926   05/16/18 1927  APTT   ONCE - STAT,   STAT     05/16/18 1926   Signed and Held  Basic metabolic panel  Tomorrow morning,   R     Signed and Held   Signed and Held  CBC  Tomorrow morning,   R     Signed and Held   Signed and Held  Troponin I - Now Then Q4H  Now then every 4 hours,   R     Signed and Held          Vitals/Pain Today's Vitals   05/16/18 1958 05/16/18 2000 05/16/18 2030 05/16/18 2045  BP: (!) 151/85 136/77 (!) 146/84   Pulse: 67 72 70   Resp: 14 16 15    Temp:      SpO2: 100% 99% 98%   Weight:      Height:      PainSc: 10-Worst pain ever   8     Isolation Precautions No active isolations  Medications Medications  nitroGLYCERIN (NITROSTAT) SL tablet 0.4 mg (0.4 mg Sublingual Given 05/16/18 1848)  heparin bolus via infusion 4,000 Units (4,000 Units Intravenous Bolus from Bag 05/16/18 2024)    Followed by  heparin ADULT infusion 100 units/mL (25000 units/246mL sodium chloride 0.45%) (850 Units/hr Intravenous Rate/Dose Verify 05/16/18 2057)  nitroGLYCERIN 50 mg in dextrose 5 % 250 mL (0.2 mg/mL) infusion (10 mcg/min Intravenous Rate/Dose Verify 05/16/18 2057)  morphine 4 MG/ML injection 4 mg (4 mg Intravenous Given 05/16/18 1954)  ondansetron (ZOFRAN) injection 4 mg (4 mg Intravenous Given 05/16/18 1953)  sodium chloride 0.9 % bolus 1,000 mL (1,000 mLs Intravenous New Bag/Given 05/16/18 1955)  iohexol (OMNIPAQUE) 350 MG/ML injection 60 mL (60 mLs Intravenous Contrast Given 05/16/18 1928)  ketorolac (TORADOL) 30 MG/ML injection 15 mg (15 mg Intravenous Given 05/16/18 2053)    Mobility walks Low fall risk   Focused Assessments Cardiac Assessment Handoff:  Cardiac Rhythm: Normal sinus rhythm  Lab Results  Component Value Date   CKTOTAL 101 06/17/2011   CKMB 0.9 06/17/2011   TROPONINI 0.04 (HH) 05/16/2018   No results found for: DDIMER Does the Patient currently have chest pain? Yes     R Recommendations: See Admitting Provider Note  Report given to:   Additional Notes:  On  heparin drip and Nitro drip @ 10 mcg/min, currently rating CP 8/10.

## 2018-05-16 NOTE — H&P (Signed)
[facility] NAME: Valerie Foster    MR#:  [id number]  DATE OF BIRTH:  10/24/1933  DATE OF ADMISSION:  05/16/2018  PRIMARY CARE PHYSICIAN: Jerrol Banana., MD   REQUESTING/REFERRING PHYSICIAN: Dr. Alfred Levins.   CHIEF COMPLAINT:   Chief Complaint  Patient presents with   Chest Pain    HISTORY OF PRESENT ILLNESS:  Valerie Foster  is a 85 y.o. female with a known history of diabetes, coronary artery disease, hypertension, hyperlipidemia, who presented to the hospital complaining of chest pain.  Patient says chest pain began around noon and it has progressively gotten worse and therefore she came to the ER for further evaluation.  She she says her pain is located in the center of her chest radiating to her back associated with some intermittent nausea vomiting.  She presented to the ER and received 3 sublingual nitroglycerin but her pain did not improve and therefore started on a nitroglycerin drip and also started on a heparin drip.  She was noted to have a mildly elevated troponin and ST changes in the inferior leads which are unchanged from her previous EKG.  She does have significant high risk factors for coronary disease and noted to have a mildly elevated troponin therefore hospitalist services were contacted for admission.  Patient denies any associated shortness of breath palpitations dizziness syncope with her chest pain.  PAST MEDICAL HISTORY:   Past Medical History:  Diagnosis Date   Anginal pain ([place])    Arthritis    Coronary artery disease    Diabetes mellitus without complication ([place])    Diverticulitis    Dysrhythmia    Heart disease    Hypercholesteremia    Hypertension    Palpitations     PAST SURGICAL HISTORY:   Past Surgical History:  Procedure Laterality Date   CARDIAC SURGERY     CATARACT EXTRACTION W/PHACO Left 01/28/2015   Procedure: CATARACT EXTRACTION PHACO AND INTRAOCULAR LENS PLACEMENT  ([place]);  Surgeon: Estill Cotta, MD;  Location: [facility] ORS;  Service: Ophthalmology;  Laterality: Left;  [place] 01:13 AP% 58.5 CDE 30.55 fluid pack lot # [id number]   CATARACT EXTRACTION W/PHACO Right 03/18/2015   Procedure: CATARACT EXTRACTION PHACO AND INTRAOCULAR LENS PLACEMENT ([facility]);  Surgeon: Estill Cotta, MD;  Location: [facility] ORS;  Service: Ophthalmology;  Laterality: Right;  [place]    1:08.3 AP%   24 CDE   30.33 fluid pack lot # [id number]   CORONARY ANGIOPLASTY     stent   CORONARY STENT PLACEMENT  06/17/2011   FINGER SURGERY     for trigger finger   TONSILLECTOMY      SOCIAL HISTORY:   Social History   Tobacco Use   Smoking status: Never Smoker   Smokeless tobacco: Never Used  Substance Use Topics   Alcohol use: No    FAMILY HISTORY:   Family History  Problem Relation Age of Onset   Hypertension Mother    Diabetes Mother    Heart attack Father    Colon cancer Sister     DRUG ALLERGIES:   Allergies  Allergen Reactions   Statins     Joint and muscle pain severe   Sulfa Antibiotics     REVIEW OF SYSTEMS:   Review of Systems  Constitutional: Negative for fever and weight loss.  HENT: Negative for congestion, nosebleeds and tinnitus.   Eyes: Negative for blurred vision, double vision and redness.  Respiratory: Negative for cough, hemoptysis and  shortness of breath.   Cardiovascular: Positive for chest pain. Negative for orthopnea, leg swelling and PND.  Gastrointestinal: Positive for nausea and vomiting. Negative for abdominal pain, diarrhea and melena.  Genitourinary: Negative for dysuria, hematuria and urgency.  Musculoskeletal: Negative for falls and joint pain.  Neurological: Negative for dizziness, tingling, sensory change, focal weakness, seizures, weakness and headaches.  Endo/Heme/Allergies: Negative for polydipsia. Does not bruise/bleed easily.  Psychiatric/Behavioral: Negative for depression and memory loss. The patient is not  nervous/anxious.     MEDICATIONS AT HOME:   Prior to Admission medications   Medication Sig Start Date End Date Taking? Authorizing Provider  amLODipine (NORVASC) 5 MG tablet Take 1 tablet (5 mg total) by mouth daily. 04/29/18  Yes Bacigalupo, Dionne Bucy, MD  aspirin 81 MG tablet Take 81 mg by mouth daily.    Yes [provider]  cetirizine (ZYRTEC) 10 MG tablet Take 1 tablet (10 mg total) by mouth daily. Patient taking differently: Take 10 mg by mouth daily as needed for allergies or rhinitis.  08/18/14  Yes Betancourt, Aura Fey, NP  diltiazem (DILACOR XR) 120 MG 24 hr capsule Take 1 capsule (120 mg total) by mouth daily. 02/03/18  Yes Jerrol Banana., MD  fluticasone [facility]) 50 MCG/ACT nasal spray Place 2 sprays daily into both nostrils. Patient taking differently: Place 2 sprays into both nostrils daily as needed for allergies or rhinitis.  01/06/17  Yes Jerrol Banana., MD  glucose blood test strip Use as instructed 01/04/18  Yes Jerrol Banana., MD  hydrALAZINE (APRESOLINE) 25 MG tablet Take 25 mg by mouth 3 (three) times daily.   Yes [provider]  LORazepam (ATIVAN) 0.5 MG tablet Take 1 tablet (0.5 mg total) by mouth 2 (two) times daily as needed for anxiety. 02/03/18  Yes Jerrol Banana., MD  metoprolol succinate (TOPROL-XL) 50 MG 24 hr tablet TAKE 1 TABLET BY MOUTH EVERY DAY Patient taking differently: Take 50 mg by mouth daily.  09/23/17  Yes Jerrol Banana., MD  olmesartan (BENICAR) 40 MG tablet TAKE 1 TABLET BY MOUTH EVERY DAY Patient taking differently: Take 40 mg by mouth every evening.  03/01/18  Yes Jerrol Banana., MD  sodium chloride (OCEAN) 0.65 % SOLN nasal spray Place 2 sprays into both nostrils every 2 (two) hours while awake. Patient taking differently: Place 2 sprays into both nostrils as needed for congestion.  08/18/14  Yes Betancourt, Aura Fey, NP  traZODone (DESYREL) 50 MG tablet TAKE 1 TABLET BY MOUTH EVERYDAY AT  BEDTIME Patient taking differently: Take 50-100 mg by mouth at bedtime.  04/05/18  Yes Jerrol Banana., MD  ciprofloxacin (CIPRO) 250 MG tablet Take 1 tablet (250 mg total) by mouth 2 (two) times daily. Patient not taking: Reported on 04/14/2018 03/24/18   Jerrol Banana., MD  cloNIDine (CATAPRES) 0.1 MG tablet TAKE 1 TABLET (0.1 MG TOTAL) BY MOUTH 3 (THREE) TIMES DAILY. Patient not taking: Reported on 05/16/2018 06/07/17   Jerrol Banana., MD  cyclobenzaprine (FLEXERIL) 10 MG tablet Take 1 tablet (10 mg total) by mouth at bedtime. Patient not taking: Reported on 05/16/2018 05/07/15   Jerrol Banana., MD  ezetimibe (ZETIA) 10 MG tablet TAKE 1 TABLET BY MOUTH EVERY DAY 04/22/18   Jerrol Banana., MD  hydrochlorothiazide (HYDRODIURIL) 25 MG tablet TAKE 1 TABLET BY MOUTH EVERY DAY Patient not taking: Reported on 03/24/2018 03/01/18   Eulas Post  Brooke Bonito., MD  metFORMIN (GLUCOPHAGE) 500 MG tablet TAKE 1 TABLET BY MOUTH EVERY DAY Patient not taking: No sig reported 03/01/18   Jerrol Banana., MD  pravastatin (PRAVACHOL) 10 MG tablet TAKE 1 TABLET BY MOUTH EVERY DAY AT NIGHT 04/22/18   Jerrol Banana., MD  tolterodine (DETROL LA) 4 MG 24 hr capsule Take 1 capsule (4 mg total) by mouth daily. 03/20/16   Jerrol Banana., MD      VITAL SIGNS:  Blood pressure (!) 146/84, pulse 70, temperature 97.8 F (36.6 C), resp. rate 15, height 5\' 7"  (1.702 m), weight 77.1 kg, SpO2 98 %.  PHYSICAL EXAMINATION:  Physical Exam  GENERAL:  85 y.o.-year-old patient lying in the bed with no acute distress.  EYES: Pupils equal, round, reactive to light and accommodation. No scleral icterus. Extraocular muscles intact.  HEENT: Head atraumatic, normocephalic. Oropharynx and nasopharynx clear. No oropharyngeal erythema, moist oral mucosa  NECK:  Supple, no jugular venous distention. No thyroid enlargement, no tenderness.  LUNGS: Normal breath sounds bilaterally, no wheezing,  rales, rhonchi. No use of accessory muscles of respiration.  CARDIOVASCULAR: S1, S2 RRR. No murmurs, rubs, gallops, clicks.  ABDOMEN: Soft, nontender, nondistended. Bowel sounds present. No organomegaly or mass.  EXTREMITIES: No pedal edema, cyanosis, or clubbing. + 2 pedal & radial pulses b/l.   NEUROLOGIC: Cranial nerves II through XII are intact. No focal Motor or sensory deficits appreciated b/l PSYCHIATRIC: The patient is alert and oriented x 3.  SKIN: No obvious rash, lesion, or ulcer.   LABORATORY PANEL:   CBC Recent Labs  Lab 05/16/18 1839  WBC 9.5  HGB 13.2  HCT 42.1  PLT 299   ------------------------------------------------------------------------------------------------------------------  Chemistries  Recent Labs  Lab 05/16/18 1839  NA 138  K 3.7  CL 104  CO2 25  GLUCOSE 139*  BUN 29*  CREATININE 1.70*  CALCIUM 9.0   ------------------------------------------------------------------------------------------------------------------  Cardiac Enzymes Recent Labs  Lab 05/16/18 1839  TROPONINI 0.04*   ------------------------------------------------------------------------------------------------------------------  RADIOLOGY:  Dg Chest Portable 1 View  Result Date: 05/16/2018 CLINICAL DATA:  Chest pain EXAM: PORTABLE CHEST 1 VIEW COMPARISON:  Jul 16, 2015 FINDINGS: There is no appreciable edema or consolidation. The heart size and pulmonary vascularity are normal. No adenopathy. No bone lesions. There is aortic atherosclerosis. IMPRESSION: No edema or consolidation. Heart size within normal limits. Aortic Atherosclerosis (ICD10-I70.0). Electronically Signed   By: Lowella Grip III M.D.   On: 05/16/2018 18:59   Ct Angio Chest Aorta W And/or Wo Contrast  Result Date: 05/16/2018 CLINICAL DATA:  Central chest pain radiating to the back and both arms. Patient was lifting weights this morning for the first time in a while. History of cardiac stent and  hypertension. EXAM: CT ANGIOGRAPHY CHEST WITH CONTRAST TECHNIQUE: Multidetector CT imaging of the chest was performed using the standard protocol during bolus administration of intravenous contrast. Multiplanar CT image reconstructions and MIPs were obtained to evaluate the vascular anatomy. CONTRAST:  23mL OMNIPAQUE IOHEXOL 350 MG/ML SOLN COMPARISON:  MRI 08/15/2008 FINDINGS: Cardiovascular: LAD stent noted. Normal heart size without pericardial effusion or thickening. Aortic atherosclerosis without aneurysm. Probable soft plaque along the periphery of the right brachiocephalic artery, series 1/85 slightly narrowing the lumen. The unenhanced pulmonary vasculature is unremarkable. Mediastinum/Nodes: No enlarged mediastinal, hilar, or axillary lymph nodes. Small hiatal hernia. Thyroid gland, trachea, and esophagus demonstrate no significant findings. Lungs/Pleura: Dependent bibasilar atelectasis. No pneumothorax, effusion or pulmonary consolidations. No dominant mass. Upper Abdomen: Redemonstration of hypodense  bilateral adrenal adenomas, largest on the right measuring up to 2 cm. Musculoskeletal: Thoracic spondylosis. Osteoarthritis osteoarthritis of the sternoclavicular joints, right greater than left. Intact manubrium and sternum. No suspicious osseous lesions. Review of the MIP images confirms the above findings. IMPRESSION: 1. No acute cardiopulmonary abnormality. 2. Stent noted within the LAD. 3. Thoracic spondylosis. Osteoarthritis of the sternoclavicular joints, right greater than left. 4. Stable bilateral adrenal adenomas. Aortic Atherosclerosis (ICD10-I70.0). Electronically Signed   By: Ashley Royalty M.D.   On: 05/16/2018 19:55     IMPRESSION AND PLAN:   85 y.o. female with a known history of diabetes, coronary artery disease, hypertension, hyperlipidemia, who presented to the hospital complaining of chest pain.  Patient says chest pain began around noon and it has progressively gotten worse and  therefore she came to the ER for further evaluation.  1.  Chest pain/suspected non-ST elevation MI- patient does have significant risk factors for coronary disease given her retention, hyperlipidemia, previous history of coronary disease with stent placement. - Her EKG shows mild ST changes in inferior leads which are unchanged from her previous EKG.  She has a mildly elevated troponin 0.04. - She will be admitted to the stepdown level of care as she is on a nitro drip, will also place on heparin drip, continue aspirin, Toprol, Pravachol. We will cycle her cardiac markers, get echocardiogram, get a cardiology consult.  2. Essential hypertension- continue Avapro, hydralazine, Toprol.  3.  Hyperlipidemia-continue Pravachol.  4.  History of urinary incontinence-continue Detrol LA.  5.  Anxiety-continue Ativan as needed.   All the records are reviewed and case discussed with ED provider. Management plans discussed with the patient, family and they are in agreement.  CODE STATUS: Full code  TOTAL TIME TAKING CARE OF THIS PATIENT: 45 minutes.    Henreitta Leber M.D on 05/16/2018 at 9:31 PM  Between 7am to 6pm - Pager - [phone]  After 6pm go to [email or web address] - password [email or web address]  [facility]  Office  [phone]  CC: Primary care physician; Jerrol Banana., MD

## 2018-05-16 NOTE — ED Notes (Signed)
Pt to CT

## 2018-05-16 NOTE — ED Notes (Signed)
CCU room changed to room 04

## 2018-05-16 NOTE — ED Provider Notes (Signed)
[facility] Emergency Department Provider Note  ____________________________________________  Time seen: Approximately 6:33 PM  I have reviewed the triage vital signs and the nursing notes.   HISTORY  Chief Complaint Chest Pain   HPI Valerie Foster is a 85 y.o. female with a history of coronary artery disease status post stent, diabetes, hypertension, hyperlipidemia who presents for evaluation of chest pain.  Patient reports this is her second episode today.  The first 1 was after lunch around 12 PM where she started having pressure in the center of her chest radiating to her back.  She felt clammy and diaphoretic.  No shortness of breath, no vomiting.  She had nausea.  She said the pain lasted about an hour and went away.  About an hour ago she started having same pain.  Currently 7 out of 10 pressure in the center of her chest radiating to her back and bilateral arms.  She feels clammy and nauseated.  Receive a full aspirin and Zofran per EMS.  She reports that she did some weight exercises this morning while in bed with her arms and initially thought her symptoms were from that.  No personal family history of blood clots, recent travel immobilization, leg pain or swelling, hemoptysis, or exogenous hormones.  Past Medical History:  Diagnosis Date   Anginal pain ([place])    Arthritis    Coronary artery disease    Diabetes mellitus without complication ([place])    Diverticulitis    Dysrhythmia    Heart disease    Hypercholesteremia    Hypertension    Palpitations     Patient Active Problem List   Diagnosis Date Noted   OAB (overactive bladder) 03/20/2016   Angina pectoris ([place]) 07/18/2014   Arthritis 07/18/2014   CAD in native artery 07/18/2014   Chronic anxiety 07/18/2014   Diabetes ([place]) 07/18/2014   BP (high blood pressure) 07/18/2014   Heart & renal disease, hypertensive, with heart failure ([place]) 07/18/2014   Insomnia, matutinal  07/18/2014   Benign neoplasm of colon 05/19/2013   Acid reflux 05/19/2013   Hyperlipidemia 05/19/2013   Neuropathy 05/19/2013    Past Surgical History:  Procedure Laterality Date   CARDIAC SURGERY     CATARACT EXTRACTION W/PHACO Left 01/28/2015   Procedure: CATARACT EXTRACTION PHACO AND INTRAOCULAR LENS PLACEMENT ([place]);  Surgeon: Estill Cotta, MD;  Location: [facility] ORS;  Service: Ophthalmology;  Laterality: Left;  [place] 01:13 AP% 58.5 CDE 30.55 fluid pack lot # [id number]   CATARACT EXTRACTION W/PHACO Right 03/18/2015   Procedure: CATARACT EXTRACTION PHACO AND INTRAOCULAR LENS PLACEMENT ([facility]);  Surgeon: Estill Cotta, MD;  Location: [facility] ORS;  Service: Ophthalmology;  Laterality: Right;  [place]    1:08.3 AP%   24 CDE   30.33 fluid pack lot # [id number]   CORONARY ANGIOPLASTY     stent   CORONARY STENT PLACEMENT  06/17/2011   FINGER SURGERY     for trigger finger   TONSILLECTOMY      Prior to Admission medications   Medication Sig Start Date End Date Taking? Authorizing Provider  amLODipine (NORVASC) 5 MG tablet Take 1 tablet (5 mg total) by mouth daily. 04/29/18  Yes Bacigalupo, Dionne Bucy, MD  aspirin 81 MG tablet Take 81 mg by mouth daily.    Yes [provider]  cetirizine (ZYRTEC) 10 MG tablet Take 1 tablet (10 mg total) by mouth daily. Patient taking differently: Take 10 mg by mouth daily as needed for allergies or rhinitis.  08/18/14  Yes Betancourt, Tina A, NP  diltiazem (DILACOR XR) 120 MG 24 hr capsule Take 1 capsule (120 mg total) by mouth daily. 02/03/18  Yes Jerrol Banana., MD  fluticasone [facility]) 50 MCG/ACT nasal spray Place 2 sprays daily into both nostrils. Patient taking differently: Place 2 sprays into both nostrils daily as needed for allergies or rhinitis.  01/06/17  Yes Jerrol Banana., MD  glucose blood test strip Use as instructed 01/04/18  Yes Jerrol Banana., MD  hydrALAZINE (APRESOLINE) 25 MG tablet Take 25 mg by mouth 3  (three) times daily.   Yes [provider]  LORazepam (ATIVAN) 0.5 MG tablet Take 1 tablet (0.5 mg total) by mouth 2 (two) times daily as needed for anxiety. 02/03/18  Yes Jerrol Banana., MD  metFORMIN (GLUCOPHAGE) 500 MG tablet TAKE 1 TABLET BY MOUTH EVERY DAY Patient taking differently: Take 500 mg by mouth daily.  03/01/18  Yes Jerrol Banana., MD  metoprolol succinate (TOPROL-XL) 50 MG 24 hr tablet TAKE 1 TABLET BY MOUTH EVERY DAY Patient taking differently: Take 50 mg by mouth daily.  09/23/17  Yes Jerrol Banana., MD  olmesartan (BENICAR) 40 MG tablet TAKE 1 TABLET BY MOUTH EVERY DAY Patient taking differently: Take 40 mg by mouth every evening.  03/01/18  Yes Jerrol Banana., MD  sodium chloride (OCEAN) 0.65 % SOLN nasal spray Place 2 sprays into both nostrils every 2 (two) hours while awake. Patient taking differently: Place 2 sprays into both nostrils as needed for congestion.  08/18/14  Yes Betancourt, Aura Fey, NP  traZODone (DESYREL) 50 MG tablet TAKE 1 TABLET BY MOUTH EVERYDAY AT BEDTIME Patient taking differently: Take 50-100 mg by mouth at bedtime.  04/05/18  Yes Jerrol Banana., MD  ciprofloxacin (CIPRO) 250 MG tablet Take 1 tablet (250 mg total) by mouth 2 (two) times daily. Patient not taking: Reported on 04/14/2018 03/24/18   Jerrol Banana., MD  cloNIDine (CATAPRES) 0.1 MG tablet TAKE 1 TABLET (0.1 MG TOTAL) BY MOUTH 3 (THREE) TIMES DAILY. Patient not taking: Reported on 05/16/2018 06/07/17   Jerrol Banana., MD  cyclobenzaprine (FLEXERIL) 10 MG tablet Take 1 tablet (10 mg total) by mouth at bedtime. Patient not taking: Reported on 05/16/2018 05/07/15   Jerrol Banana., MD  ezetimibe (ZETIA) 10 MG tablet TAKE 1 TABLET BY MOUTH EVERY DAY 04/22/18   Jerrol Banana., MD  hydrochlorothiazide (HYDRODIURIL) 25 MG tablet TAKE 1 TABLET BY MOUTH EVERY DAY Patient not taking: Reported on 03/24/2018 03/01/18   Jerrol Banana.,  MD  pravastatin (PRAVACHOL) 10 MG tablet TAKE 1 TABLET BY MOUTH EVERY DAY AT NIGHT 04/22/18   Jerrol Banana., MD  tolterodine (DETROL LA) 4 MG 24 hr capsule Take 1 capsule (4 mg total) by mouth daily. 03/20/16   Jerrol Banana., MD    Allergies Statins and Sulfa antibiotics  Family History  Problem Relation Age of Onset   Hypertension Mother    Diabetes Mother    Heart attack Father    Colon cancer Sister     Social History Social History   Tobacco Use   Smoking status: Never Smoker   Smokeless tobacco: Never Used  Substance Use Topics   Alcohol use: No   Drug use: No    Review of Systems  Constitutional: Negative for fever. Eyes: Negative for visual changes. ENT: Negative for sore throat. Neck:  No neck pain  Cardiovascular: + chest pain. Respiratory: Negative for shortness of breath. Gastrointestinal: Negative for abdominal pain, vomiting or diarrhea. Genitourinary: Negative for dysuria. Musculoskeletal: Negative for back pain. Skin: Negative for rash. Neurological: Negative for headaches, weakness or numbness. Psych: No SI or HI  ____________________________________________   PHYSICAL EXAM:  VITAL SIGNS: ED Triage Vitals  Enc Vitals Group     BP 05/16/18 1823 (!) 148/88     Pulse Rate 05/16/18 1823 62     Resp 05/16/18 1823 (!) 22     Temp 05/16/18 1823 97.8 F (36.6 C)     Temp src --      SpO2 05/16/18 1823 99 %     Weight 05/16/18 1824 170 lb (77.1 kg)     Height 05/16/18 1824 5\' 7"  (1.702 m)     Head Circumference --      Peak Flow --      Pain Score 05/16/18 1824 9     Pain Loc --      Pain Edu? --      Excl. in [place]? --     Constitutional: Alert and oriented. Well appearing and in no apparent distress. HEENT:      Head: Normocephalic and atraumatic.         Eyes: Conjunctivae are normal. Sclera is non-icteric.       Mouth/Throat: Mucous membranes are moist.       Neck: Supple with no signs of  meningismus. Cardiovascular: Regular rate and rhythm. No murmurs, gallops, or rubs. 2+ symmetrical distal pulses are present in all extremities. No JVD. Respiratory: Normal respiratory effort. Lungs are clear to auscultation bilaterally. No wheezes, crackles, or rhonchi.  Gastrointestinal: Soft, non tender, and non distended with positive bowel sounds. No rebound or guarding. Musculoskeletal: Nontender with normal range of motion in all extremities. No edema, cyanosis, or erythema of extremities. Neurologic: Normal speech and language. Face is symmetric. Moving all extremities. No gross focal neurologic deficits are appreciated. Skin: Skin is warm, dry and intact. No rash noted. Psychiatric: Mood and affect are normal. Speech and behavior are normal.  ____________________________________________   LABS (all labs ordered are listed, but only abnormal results are displayed)  Labs Reviewed  BASIC METABOLIC PANEL - Abnormal; Notable for the following components:      Result Value   Glucose, Bld 139 (*)    BUN 29 (*)    Creatinine, Ser 1.70 (*)    GFR calc non Af Amer 27 (*)    GFR calc Af Amer 32 (*)    All other components within normal limits  TROPONIN I - Abnormal; Notable for the following components:   Troponin I 0.04 (*)    All other components within normal limits  CBC WITH DIFFERENTIAL/PLATELET  PROTIME-INR  APTT  HEPARIN LEVEL (UNFRACTIONATED)   ____________________________________________  EKG  ED ECG REPORT I, Rudene Re, the attending physician, personally viewed and interpreted this ECG.  18:20 -normal sinus rhythm, rate of 61, normal intervals, normal axis, less than 1 mm ST elevation in V1 to V3 with no reciprocal changes.  These findings are new when compared to prior.  18:28 -normal sinus rhythm, rate of 63, normal intervals, normal axis, persistent ST elevations in anterior leads, slightly worse in V2 again with no reciprocal changes.  18:45 -normal  sinus rhythm, rate of 65, normal intervals, normal axis, persistent mild ST elevations in anterior leads with no reciprocal changes.  Unchanged from prior. ____________________________________________  RADIOLOGY  I have  personally reviewed the images performed during this visit and I agree with the Radiologist's read.   Interpretation by Radiologist:  Dg Chest Portable 1 View  Result Date: 05/16/2018 CLINICAL DATA:  Chest pain EXAM: PORTABLE CHEST 1 VIEW COMPARISON:  Jul 16, 2015 FINDINGS: There is no appreciable edema or consolidation. The heart size and pulmonary vascularity are normal. No adenopathy. No bone lesions. There is aortic atherosclerosis. IMPRESSION: No edema or consolidation. Heart size within normal limits. Aortic Atherosclerosis (ICD10-I70.0). Electronically Signed   By: Lowella Grip III M.D.   On: 05/16/2018 18:59   Ct Angio Chest Aorta W And/or Wo Contrast  Result Date: 05/16/2018 CLINICAL DATA:  Central chest pain radiating to the back and both arms. Patient was lifting weights this morning for the first time in a while. History of cardiac stent and hypertension. EXAM: CT ANGIOGRAPHY CHEST WITH CONTRAST TECHNIQUE: Multidetector CT imaging of the chest was performed using the standard protocol during bolus administration of intravenous contrast. Multiplanar CT image reconstructions and MIPs were obtained to evaluate the vascular anatomy. CONTRAST:  65mL OMNIPAQUE IOHEXOL 350 MG/ML SOLN COMPARISON:  MRI 08/15/2008 FINDINGS: Cardiovascular: LAD stent noted. Normal heart size without pericardial effusion or thickening. Aortic atherosclerosis without aneurysm. Probable soft plaque along the periphery of the right brachiocephalic artery, series 8/41 slightly narrowing the lumen. The unenhanced pulmonary vasculature is unremarkable. Mediastinum/Nodes: No enlarged mediastinal, hilar, or axillary lymph nodes. Small hiatal hernia. Thyroid gland, trachea, and esophagus demonstrate no  significant findings. Lungs/Pleura: Dependent bibasilar atelectasis. No pneumothorax, effusion or pulmonary consolidations. No dominant mass. Upper Abdomen: Redemonstration of hypodense bilateral adrenal adenomas, largest on the right measuring up to 2 cm. Musculoskeletal: Thoracic spondylosis. Osteoarthritis osteoarthritis of the sternoclavicular joints, right greater than left. Intact manubrium and sternum. No suspicious osseous lesions. Review of the MIP images confirms the above findings. IMPRESSION: 1. No acute cardiopulmonary abnormality. 2. Stent noted within the LAD. 3. Thoracic spondylosis. Osteoarthritis of the sternoclavicular joints, right greater than left. 4. Stable bilateral adrenal adenomas. Aortic Atherosclerosis (ICD10-I70.0). Electronically Signed   By: Ashley Royalty M.D.   On: 05/16/2018 19:55      ____________________________________________   PROCEDURES  Procedure(s) performed: None Procedures Critical Care performed: yes CRITICAL CARE Performed by: Rudene Re  ?  Total critical care time: 40 min  Critical care time was exclusive of separately billable procedures and treating other patients.  Critical care was necessary to treat or prevent imminent or life-threatening deterioration.  Critical care was time spent personally by me on the following activities: development of treatment plan with patient and/or surrogate as well as nursing, discussions with consultants, evaluation of patient's response to treatment, examination of patient, obtaining history from patient or surrogate, ordering and performing treatments and interventions, ordering and review of laboratory studies, ordering and review of radiographic studies, pulse oximetry and re-evaluation of patient's condition.  ____________________________________________   INITIAL IMPRESSION / ASSESSMENT AND PLAN / ED COURSE   85 y.o. female with a history of coronary artery disease status post stent, diabetes,  hypertension, hyperlipidemia who presents for evaluation of chest pain.  Initial EKG and repeat EKG in 10 minutes showing mild ST elevations in anterior leads with no reciprocal changes which are new when compared to prior.  Patient does not meet criteria for STEMI however EKG is ischemic in nature.  Will give nitro, get labs, recheck EKG in 10 min.   _________________________ 8:29 PM on 05/16/2018 -----------------------------------------  Serial EKGs showing no changes.  After sublingual nitro patient  reports that the pain in her chest resolved but is mostly in the upper back therefore she was sent for CT angiogram to rule out dissection or PE.  CT is negative.  Her first troponin came back at 0.04.  Patient was started on a nitro drip to maintain her pain well controlled.  She was also started on a heparin drip.  Discussed with Dr. Verdell Carmine for admission for NSTEMI.       As part of my medical decision making, I reviewed the following data within the [place] notes reviewed and incorporated, Labs reviewed , EKG interpreted , Old EKG reviewed, Old chart reviewed, Radiograph reviewed , Discussed with admitting physician , Notes from prior ED visits and [place] Controlled Substance Database    Pertinent labs & imaging results that were available during my care of the patient were reviewed by me and considered in my medical decision making (see chart for details).    ____________________________________________   FINAL CLINICAL IMPRESSION(S) / ED DIAGNOSES  Final diagnoses:  NSTEMI (non-ST elevated myocardial infarction) ([place])      NEW MEDICATIONS STARTED DURING THIS VISIT:  ED Discharge Orders    None       Note:  This document was prepared using Dragon voice recognition software and may include unintentional dictation errors.    Alfred Levins, [place], MD 05/16/18 2030

## 2018-05-16 NOTE — Consult Note (Signed)
ANTICOAGULATION CONSULT NOTE - Initial Consult  Pharmacy Consult for Heparin Indication: chest pain/ACS  Allergies  Allergen Reactions  . Statins     Joint and muscle pain severe  . Sulfa Antibiotics     Patient Measurements: Height: 5\' 7"  (170.2 cm) Weight: 170 lb (77.1 kg) IBW/kg (Calculated) : 61.6 Heparin Dosing Weight: 77kg  Vital Signs: Temp: 97.8 F (36.6 C) (03/23 1823) BP: 118/64 (03/23 1850) Pulse Rate: 65 (03/23 1850)  Labs: Recent Labs    05/16/18 1839  HGB 13.2  HCT 42.1  PLT 299  CREATININE 1.70*    Estimated Creatinine Clearance: 26.8 mL/min (A) (by C-G formula based on SCr of 1.7 mg/dL (H)).   Medical History: Past Medical History:  Diagnosis Date  . Anginal pain ([place])   . Arthritis   . Coronary artery disease   . Diabetes mellitus without complication ([place])   . Diverticulitis   . Dysrhythmia   . Heart disease   . Hypercholesteremia   . Hypertension   . Palpitations     Medications:  No pta anticoagulant  Assessment: 85 yo female with chest pain - pharmacy has been consulted to initiate a heparin drip.   Goal of Therapy:  Heparin level 0.3-0.7 units/ml Monitor platelets by anticoagulation protocol: Yes   Plan:  Give 4000 units bolus x 1, followed by 850 units/hr Will check baseline aPTT, CBC, and INR, Will follow heparin level in 8 hours per protocol  Lu Duffel, PharmD, BCPS Clinical Pharmacist 05/16/2018 7:24 PM

## 2018-05-16 NOTE — ED Notes (Signed)
Admitting provider at bedside.

## 2018-05-16 NOTE — Progress Notes (Signed)
eLink Physician-Brief Progress Note Patient Name: Valerie Foster DOB: 1933/07/22 MRN: [id number]   Date of Service  05/16/2018  HPI/Events of Note  85 yo female admitted with angina requiring Heparin and Nitroglycerin IV infusions. PCCM asked to assume care. VSS.  eICU Interventions  No new orders.      Intervention Category Evaluation Type: New Patient Evaluation  Lysle Dingwall 05/16/2018, 9:54 PM

## 2018-05-16 NOTE — ED Triage Notes (Signed)
PT to Ed via  EMS from home. PT c/o central chest pain that radiates to back and both arms. Pt was lifting weights this morning for the first time in a while. HX of cardiac stent stent and HTN. Pt states she has been having night sweats for the past few nights. AO, no SOB, visibly in pain, grasping chest.

## 2018-05-16 NOTE — ED Notes (Signed)
Pt resting on stretcher with c/o continued chest pain. Pt asking "am I going to die?". Provided for pt safety and comfort and will continue to assess.

## 2018-05-16 NOTE — ED Notes (Signed)
PT c/o back pain getting worse, but chest pain feeling a little better

## 2018-05-16 NOTE — Progress Notes (Signed)
   [place] at [facility] Hospital Day: 0 days Valerie Foster is a 85 y.o. female with past medical history of coronary artery disease, hypertension, hyperlipidemia, atrial fibrillation presenting with Chest Pain .   CODE STATUS was discussed with the patient at bedside and explained to her the difference between DNR and full code.  After discussions patient wants to be a full code.  Advance care planning discussed with patient  with additional Family at bedside. All questions in regards to overall condition and expected prognosis answered. The decision was made to continue current code status  CODE STATUS: full Time spent: 16 minutes

## 2018-05-17 ENCOUNTER — Inpatient Hospital Stay (HOSPITAL_COMMUNITY)
Admission: AD | Admit: 2018-05-17 | Discharge: 2018-05-25 | DRG: 270 | Disposition: E | Payer: Medicare Other | Source: Other Acute Inpatient Hospital | Attending: Interventional Cardiology | Admitting: Interventional Cardiology

## 2018-05-17 ENCOUNTER — Encounter
Admission: EM | Disposition: A | Discharge status: Other Acute Inpatient Hospital | Payer: Self-pay | Source: Home / Self Care | Attending: Internal Medicine

## 2018-05-17 ENCOUNTER — Inpatient Hospital Stay (HOSPITAL_COMMUNITY): Payer: Medicare Other

## 2018-05-17 ENCOUNTER — Inpatient Hospital Stay (HOSPITAL_COMMUNITY)
Admission: AD | Disposition: E | Payer: Self-pay | Source: Other Acute Inpatient Hospital | Attending: Interventional Cardiology

## 2018-05-17 ENCOUNTER — Encounter: Payer: Self-pay | Admitting: Cardiovascular Disease

## 2018-05-17 DIAGNOSIS — I10 Essential (primary) hypertension: Secondary | ICD-10-CM | POA: Diagnosis not present

## 2018-05-17 DIAGNOSIS — Z452 Encounter for adjustment and management of vascular access device: Secondary | ICD-10-CM | POA: Diagnosis not present

## 2018-05-17 DIAGNOSIS — Z882 Allergy status to sulfonamides status: Secondary | ICD-10-CM

## 2018-05-17 DIAGNOSIS — Z7189 Other specified counseling: Secondary | ICD-10-CM

## 2018-05-17 DIAGNOSIS — E7800 Pure hypercholesterolemia, unspecified: Secondary | ICD-10-CM | POA: Diagnosis present

## 2018-05-17 DIAGNOSIS — J9690 Respiratory failure, unspecified, unspecified whether with hypoxia or hypercapnia: Secondary | ICD-10-CM | POA: Diagnosis not present

## 2018-05-17 DIAGNOSIS — I259 Chronic ischemic heart disease, unspecified: Secondary | ICD-10-CM

## 2018-05-17 DIAGNOSIS — J9601 Acute respiratory failure with hypoxia: Secondary | ICD-10-CM

## 2018-05-17 DIAGNOSIS — R7989 Other specified abnormal findings of blood chemistry: Secondary | ICD-10-CM | POA: Diagnosis not present

## 2018-05-17 DIAGNOSIS — Z955 Presence of coronary angioplasty implant and graft: Secondary | ICD-10-CM | POA: Diagnosis not present

## 2018-05-17 DIAGNOSIS — Z515 Encounter for palliative care: Secondary | ICD-10-CM | POA: Diagnosis not present

## 2018-05-17 DIAGNOSIS — I509 Heart failure, unspecified: Secondary | ICD-10-CM

## 2018-05-17 DIAGNOSIS — R570 Cardiogenic shock: Secondary | ICD-10-CM | POA: Diagnosis present

## 2018-05-17 DIAGNOSIS — Z978 Presence of other specified devices: Secondary | ICD-10-CM | POA: Diagnosis not present

## 2018-05-17 DIAGNOSIS — I2102 ST elevation (STEMI) myocardial infarction involving left anterior descending coronary artery: Secondary | ICD-10-CM | POA: Diagnosis not present

## 2018-05-17 DIAGNOSIS — I4439 Other atrioventricular block: Secondary | ICD-10-CM | POA: Diagnosis not present

## 2018-05-17 DIAGNOSIS — I5021 Acute systolic (congestive) heart failure: Secondary | ICD-10-CM

## 2018-05-17 DIAGNOSIS — Z7982 Long term (current) use of aspirin: Secondary | ICD-10-CM

## 2018-05-17 DIAGNOSIS — I70201 Unspecified atherosclerosis of native arteries of extremities, right leg: Secondary | ICD-10-CM | POA: Diagnosis present

## 2018-05-17 DIAGNOSIS — Q210 Ventricular septal defect: Secondary | ICD-10-CM | POA: Diagnosis not present

## 2018-05-17 DIAGNOSIS — R34 Anuria and oliguria: Secondary | ICD-10-CM | POA: Diagnosis not present

## 2018-05-17 DIAGNOSIS — E1122 Type 2 diabetes mellitus with diabetic chronic kidney disease: Secondary | ICD-10-CM | POA: Diagnosis present

## 2018-05-17 DIAGNOSIS — I998 Other disorder of circulatory system: Secondary | ICD-10-CM

## 2018-05-17 DIAGNOSIS — T82593A Other mechanical complication of balloon (counterpulsation) device, initial encounter: Secondary | ICD-10-CM | POA: Diagnosis present

## 2018-05-17 DIAGNOSIS — I2109 ST elevation (STEMI) myocardial infarction involving other coronary artery of anterior wall: Secondary | ICD-10-CM

## 2018-05-17 DIAGNOSIS — I469 Cardiac arrest, cause unspecified: Secondary | ICD-10-CM

## 2018-05-17 DIAGNOSIS — I2510 Atherosclerotic heart disease of native coronary artery without angina pectoris: Secondary | ICD-10-CM | POA: Diagnosis present

## 2018-05-17 DIAGNOSIS — I130 Hypertensive heart and chronic kidney disease with heart failure and stage 1 through stage 4 chronic kidney disease, or unspecified chronic kidney disease: Secondary | ICD-10-CM | POA: Diagnosis present

## 2018-05-17 DIAGNOSIS — R079 Chest pain, unspecified: Secondary | ICD-10-CM | POA: Diagnosis not present

## 2018-05-17 DIAGNOSIS — R978 Other abnormal tumor markers: Secondary | ICD-10-CM | POA: Diagnosis not present

## 2018-05-17 DIAGNOSIS — N179 Acute kidney failure, unspecified: Secondary | ICD-10-CM | POA: Diagnosis not present

## 2018-05-17 DIAGNOSIS — I232 Ventricular septal defect as current complication following acute myocardial infarction: Secondary | ICD-10-CM | POA: Diagnosis not present

## 2018-05-17 DIAGNOSIS — Z951 Presence of aortocoronary bypass graft: Secondary | ICD-10-CM | POA: Diagnosis not present

## 2018-05-17 DIAGNOSIS — Z66 Do not resuscitate: Secondary | ICD-10-CM | POA: Diagnosis not present

## 2018-05-17 DIAGNOSIS — R0902 Hypoxemia: Secondary | ICD-10-CM | POA: Diagnosis not present

## 2018-05-17 DIAGNOSIS — Z833 Family history of diabetes mellitus: Secondary | ICD-10-CM | POA: Diagnosis not present

## 2018-05-17 DIAGNOSIS — E785 Hyperlipidemia, unspecified: Secondary | ICD-10-CM | POA: Diagnosis not present

## 2018-05-17 DIAGNOSIS — Z8674 Personal history of sudden cardiac arrest: Secondary | ICD-10-CM

## 2018-05-17 DIAGNOSIS — E872 Acidosis: Secondary | ICD-10-CM | POA: Diagnosis present

## 2018-05-17 DIAGNOSIS — E1151 Type 2 diabetes mellitus with diabetic peripheral angiopathy without gangrene: Secondary | ICD-10-CM | POA: Diagnosis present

## 2018-05-17 DIAGNOSIS — I340 Nonrheumatic mitral (valve) insufficiency: Secondary | ICD-10-CM | POA: Diagnosis present

## 2018-05-17 DIAGNOSIS — G9340 Encephalopathy, unspecified: Secondary | ICD-10-CM

## 2018-05-17 DIAGNOSIS — N184 Chronic kidney disease, stage 4 (severe): Secondary | ICD-10-CM | POA: Diagnosis present

## 2018-05-17 DIAGNOSIS — Y712 Prosthetic and other implants, materials and accessory cardiovascular devices associated with adverse incidents: Secondary | ICD-10-CM | POA: Diagnosis present

## 2018-05-17 DIAGNOSIS — Z79899 Other long term (current) drug therapy: Secondary | ICD-10-CM

## 2018-05-17 DIAGNOSIS — Y838 Other surgical procedures as the cause of abnormal reaction of the patient, or of later complication, without mention of misadventure at the time of the procedure: Secondary | ICD-10-CM | POA: Diagnosis not present

## 2018-05-17 DIAGNOSIS — Z8249 Family history of ischemic heart disease and other diseases of the circulatory system: Secondary | ICD-10-CM

## 2018-05-17 DIAGNOSIS — Z9889 Other specified postprocedural states: Secondary | ICD-10-CM

## 2018-05-17 DIAGNOSIS — Z888 Allergy status to other drugs, medicaments and biological substances status: Secondary | ICD-10-CM

## 2018-05-17 DIAGNOSIS — Z4509 Encounter for adjustment and management of other cardiac device: Secondary | ICD-10-CM

## 2018-05-17 HISTORY — PX: LEFT HEART CATH AND CORONARY ANGIOGRAPHY: CATH118249

## 2018-05-17 HISTORY — PX: IABP INSERTION: CATH118242

## 2018-05-17 HISTORY — PX: CORONARY/GRAFT ACUTE MI REVASCULARIZATION: CATH118305

## 2018-05-17 HISTORY — PX: RIGHT HEART CATH: CATH118263

## 2018-05-17 LAB — POCT I-STAT EG7
ACID-BASE DEFICIT: 14 mmol/L — AB (ref 0.0–2.0)
Acid-base deficit: 12 mmol/L — ABNORMAL HIGH (ref 0.0–2.0)
Acid-base deficit: 13 mmol/L — ABNORMAL HIGH (ref 0.0–2.0)
BICARBONATE: 13.1 mmol/L — AB (ref 20.0–28.0)
Bicarbonate: 12.3 mmol/L — ABNORMAL LOW (ref 20.0–28.0)
Bicarbonate: 13.9 mmol/L — ABNORMAL LOW (ref 20.0–28.0)
Calcium, Ion: 1.12 mmol/L — ABNORMAL LOW (ref 1.15–1.40)
Calcium, Ion: 1.12 mmol/L — ABNORMAL LOW (ref 1.15–1.40)
Calcium, Ion: 1.13 mmol/L — ABNORMAL LOW (ref 1.15–1.40)
HCT: 34 % — ABNORMAL LOW (ref 36.0–46.0)
HCT: 35 % — ABNORMAL LOW (ref 36.0–46.0)
HCT: 36 % (ref 36.0–46.0)
Hemoglobin: 11.6 g/dL — ABNORMAL LOW (ref 12.0–15.0)
Hemoglobin: 11.9 g/dL — ABNORMAL LOW (ref 12.0–15.0)
Hemoglobin: 12.2 g/dL (ref 12.0–15.0)
O2 Saturation: 50 %
O2 Saturation: 85 %
O2 Saturation: 86 %
PH VEN: 7.225 — AB (ref 7.250–7.430)
Potassium: 4.6 mmol/L (ref 3.5–5.1)
Potassium: 4.7 mmol/L (ref 3.5–5.1)
Potassium: 4.8 mmol/L (ref 3.5–5.1)
Sodium: 136 mmol/L (ref 135–145)
Sodium: 136 mmol/L (ref 135–145)
Sodium: 137 mmol/L (ref 135–145)
TCO2: 13 mmol/L — ABNORMAL LOW (ref 22–32)
TCO2: 14 mmol/L — ABNORMAL LOW (ref 22–32)
TCO2: 15 mmol/L — ABNORMAL LOW (ref 22–32)
pCO2, Ven: 29 mmHg — ABNORMAL LOW (ref 44.0–60.0)
pCO2, Ven: 29.7 mmHg — ABNORMAL LOW (ref 44.0–60.0)
pCO2, Ven: 32.3 mmHg — ABNORMAL LOW (ref 44.0–60.0)
pH, Ven: 7.241 — ABNORMAL LOW (ref 7.250–7.430)
pH, Ven: 7.263 (ref 7.250–7.430)
pO2, Ven: 31 mmHg — CL (ref 32.0–45.0)
pO2, Ven: 56 mmHg — ABNORMAL HIGH (ref 32.0–45.0)
pO2, Ven: 60 mmHg — ABNORMAL HIGH (ref 32.0–45.0)

## 2018-05-17 LAB — POCT I-STAT 7, (LYTES, BLD GAS, ICA,H+H)
Acid-base deficit: 11 mmol/L — ABNORMAL HIGH (ref 0.0–2.0)
Bicarbonate: 14.8 mmol/L — ABNORMAL LOW (ref 20.0–28.0)
Calcium, Ion: 1.17 mmol/L (ref 1.15–1.40)
HCT: 36 % (ref 36.0–46.0)
HEMOGLOBIN: 12.2 g/dL (ref 12.0–15.0)
O2 Saturation: 100 %
Patient temperature: 98.5
Potassium: 4.9 mmol/L (ref 3.5–5.1)
Sodium: 134 mmol/L — ABNORMAL LOW (ref 135–145)
TCO2: 16 mmol/L — ABNORMAL LOW (ref 22–32)
pCO2 arterial: 31.3 mmHg — ABNORMAL LOW (ref 32.0–48.0)
pH, Arterial: 7.282 — ABNORMAL LOW (ref 7.350–7.450)
pO2, Arterial: 357 mmHg — ABNORMAL HIGH (ref 83.0–108.0)

## 2018-05-17 LAB — TROPONIN I
Troponin I: 0.62 ng/mL (ref <0.03)
Troponin I: 12.77 ng/mL (ref <0.03)
Troponin I: >65 ng/mL (ref <0.03)
Troponin I: >65 ng/mL (ref <0.03)
Troponin I: >65 ng/mL (ref <0.03)

## 2018-05-17 LAB — POCT ACTIVATED CLOTTING TIME
Activated Clotting Time: 263 seconds
Activated Clotting Time: 268 seconds

## 2018-05-17 LAB — CBC
HCT: 43.4 % (ref 36.0–46.0)
Hemoglobin: 13.5 g/dL (ref 12.0–15.0)
MCH: 26.6 pg (ref 26.0–34.0)
MCHC: 31.1 g/dL (ref 30.0–36.0)
MCV: 85.6 fL (ref 80.0–100.0)
NRBC: 0 % (ref 0.0–0.2)
Platelets: 291 10*3/uL (ref 150–400)
RBC: 5.07 MIL/uL (ref 3.87–5.11)
RDW: 13 % (ref 11.5–15.5)
WBC: 12.1 10*3/uL — ABNORMAL HIGH (ref 4.0–10.5)

## 2018-05-17 LAB — GLUCOSE, CAPILLARY
Glucose-Capillary: 319 mg/dL — ABNORMAL HIGH (ref 70–99)
Glucose-Capillary: 298 mg/dL — ABNORMAL HIGH (ref 70–99)
Glucose-Capillary: 251 mg/dL — ABNORMAL HIGH (ref 70–99)

## 2018-05-17 LAB — BASIC METABOLIC PANEL
Anion gap: 12 (ref 5–15)
Anion gap: 20 — ABNORMAL HIGH (ref 5–15)
BUN: 25 mg/dL — ABNORMAL HIGH (ref 8–23)
BUN: 29 mg/dL — ABNORMAL HIGH (ref 8–23)
CO2: 20 mmol/L — ABNORMAL LOW (ref 22–32)
CO2: 8 mmol/L — ABNORMAL LOW (ref 22–32)
CREATININE: 1.47 mg/dL — AB (ref 0.44–1.00)
Calcium: 8.9 mg/dL (ref 8.9–10.3)
Calcium: 8.3 mg/dL — ABNORMAL LOW (ref 8.9–10.3)
Chloride: 107 mmol/L (ref 98–111)
Chloride: 109 mmol/L (ref 98–111)
Creatinine, Ser: 2.41 mg/dL — ABNORMAL HIGH (ref 0.44–1.00)
GFR calc Af Amer: 21 mL/min — ABNORMAL LOW (ref >60)
GFR calc non Af Amer: 33 mL/min — ABNORMAL LOW (ref >60)
GFR calc non Af Amer: 18 mL/min — ABNORMAL LOW (ref >60)
GFR, EST AFRICAN AMERICAN: 38 mL/min — AB (ref >60)
Glucose, Bld: 233 mg/dL — ABNORMAL HIGH (ref 70–99)
Glucose, Bld: 262 mg/dL — ABNORMAL HIGH (ref 70–99)
Potassium: 4.1 mmol/L (ref 3.5–5.1)
Potassium: 5.9 mmol/L — ABNORMAL HIGH (ref 3.5–5.1)
Sodium: 139 mmol/L (ref 135–145)
Sodium: 137 mmol/L (ref 135–145)

## 2018-05-17 LAB — HEPARIN LEVEL (UNFRACTIONATED)
Heparin Unfractionated: >2.2 IU/mL — ABNORMAL HIGH (ref 0.30–0.70)
Heparin Unfractionated: 1.98 IU/mL — ABNORMAL HIGH (ref 0.30–0.70)

## 2018-05-17 LAB — ECHOCARDIOGRAM LIMITED

## 2018-05-17 LAB — BRAIN NATRIURETIC PEPTIDE: B Natriuretic Peptide: 416.6 pg/mL — ABNORMAL HIGH (ref 0.0–100.0)

## 2018-05-17 LAB — ECHOCARDIOGRAM COMPLETE

## 2018-05-17 LAB — TRIGLYCERIDES: TRIGLYCERIDES: 96 mg/dL (ref <150)

## 2018-05-17 SURGERY — IABP INSERTION
Anesthesia: LOCAL

## 2018-05-17 SURGERY — CORONARY/GRAFT ACUTE MI REVASCULARIZATION
Anesthesia: Moderate Sedation

## 2018-05-17 MED ORDER — MIDAZOLAM HCL 2 MG/2ML IJ SOLN
INTRAMUSCULAR | Status: AC
  Filled 2018-05-17: qty 2

## 2018-05-17 MED ORDER — IOPAMIDOL (ISOVUE-300) INJECTION 61%
INTRAVENOUS | Status: DC | PRN
  Administered 2018-05-17: 135 mL via INTRA_ARTERIAL

## 2018-05-17 MED ORDER — NITROGLYCERIN 5 MG/ML IV SOLN
INTRAVENOUS | Status: AC
  Filled 2018-05-17: qty 10

## 2018-05-17 MED ORDER — NOREPINEPHRINE BITARTRATE 1 MG/ML IV SOLN
INTRAVENOUS | Status: DC | PRN
  Administered 2018-05-17: 30 ug/min via INTRAVENOUS

## 2018-05-17 MED ORDER — CANGRELOR TETRASODIUM 50 MG IV SOLR
INTRAVENOUS | Status: AC
  Filled 2018-05-17: qty 50

## 2018-05-17 MED ORDER — VERAPAMIL HCL 2.5 MG/ML IV SOLN
INTRAVENOUS | Status: AC
  Filled 2018-05-17: qty 2

## 2018-05-17 MED ORDER — SUCCINYLCHOLINE CHLORIDE 20 MG/ML IJ SOLN
INTRAMUSCULAR | Status: DC | PRN
  Administered 2018-05-17: 100 mg via INTRAVENOUS

## 2018-05-17 MED ORDER — ACETAMINOPHEN 325 MG PO TABS: 650.0000 mg | ORAL_TABLET | ORAL | Status: DC | PRN

## 2018-05-17 MED ORDER — HEPARIN SODIUM (PORCINE) 1000 UNIT/ML IJ SOLN
INTRAMUSCULAR | Status: DC | PRN
  Administered 2018-05-17: 7000 [IU] via INTRAVENOUS
  Administered 2018-05-17: 3000 [IU] via INTRAVENOUS

## 2018-05-17 MED ORDER — HEPARIN (PORCINE) 25000 UT/250ML-% IV SOLN
350.0000 [IU]/h | INTRAVENOUS | Status: DC
  Administered 2018-05-17: 500 [IU]/h via INTRAVENOUS

## 2018-05-17 MED ORDER — PROPOFOL 1000 MG/100ML IV EMUL
0.0000 ug/kg/min | INTRAVENOUS | Status: DC
  Administered 2018-05-17 (×2): 12 ug/kg/min via INTRAVENOUS
  Administered 2018-05-18: 14 ug/kg/min via INTRAVENOUS
  Filled 2018-05-17 (×2): qty 100

## 2018-05-17 MED ORDER — PROPOFOL BOLUS VIA INFUSION
INTRAVENOUS | Status: DC | PRN
  Administered 2018-05-17: 1176 mg via INTRAVENOUS

## 2018-05-17 MED ORDER — SODIUM CHLORIDE 0.9 % IV BOLUS
250.0000 mL | Freq: Once | INTRAVENOUS | Status: AC
  Administered 2018-05-17: 250 mL via INTRAVENOUS

## 2018-05-17 MED ORDER — HEPARIN (PORCINE) 25000 UT/250ML-% IV SOLN
INTRAVENOUS | Status: AC | PRN
Start: 2018-05-17 — End: 2018-05-17
  Administered 2018-05-17: 700 [IU]/h via INTRAVENOUS

## 2018-05-17 MED ORDER — SODIUM CHLORIDE 0.9 % IV SOLN
250.0000 mL | INTRAVENOUS | Status: DC | PRN
  Administered 2018-05-18: 250 mL via INTRAVENOUS

## 2018-05-17 MED ORDER — FAMOTIDINE 20 MG IN NS 100 ML IVPB
20.0000 mg | INTRAVENOUS | Status: DC
  Administered 2018-05-17: 20 mg via INTRAVENOUS
  Filled 2018-05-17: qty 100

## 2018-05-17 MED ORDER — SODIUM CHLORIDE 0.9 % IV SOLN
INTRAVENOUS | Status: AC | PRN
  Administered 2018-05-17: 10 mL/h via INTRAVENOUS

## 2018-05-17 MED ORDER — ROSUVASTATIN CALCIUM 20 MG PO TABS
20.0000 mg | ORAL_TABLET | Freq: Every day | ORAL | Status: DC
  Administered 2018-05-17: 20 mg via ORAL
  Filled 2018-05-17: qty 1

## 2018-05-17 MED ORDER — ONDANSETRON HCL 4 MG/2ML IJ SOLN: 4.0000 mg | Freq: Four times a day (QID) | INTRAMUSCULAR | Status: DC | PRN

## 2018-05-17 MED ORDER — SODIUM CHLORIDE 0.9% FLUSH: 3.0000 mL | INTRAVENOUS | Status: DC | PRN

## 2018-05-17 MED ORDER — ASPIRIN 81 MG PO CHEW: 81.0000 mg | CHEWABLE_TABLET | Freq: Every day | ORAL | Status: DC

## 2018-05-17 MED ORDER — HEPARIN (PORCINE) IN NACL 1000-0.9 UT/500ML-% IV SOLN
INTRAVENOUS | Status: AC
  Filled 2018-05-17: qty 2000

## 2018-05-17 MED ORDER — LIDOCAINE HCL (PF) 1 % IJ SOLN
INTRAMUSCULAR | Status: DC | PRN
  Administered 2018-05-17: 18 mL

## 2018-05-17 MED ORDER — CHLORHEXIDINE GLUCONATE 0.12% ORAL RINSE (MEDLINE KIT)
15.0000 mL | Freq: Two times a day (BID) | OROMUCOSAL | Status: DC
  Administered 2018-05-17 (×2): 15 mL via OROMUCOSAL

## 2018-05-17 MED ORDER — ORAL CARE MOUTH RINSE
15.0000 mL | OROMUCOSAL | Status: DC
  Administered 2018-05-17 – 2018-05-18 (×8): 15 mL via OROMUCOSAL

## 2018-05-17 MED ORDER — HEPARIN (PORCINE) IN NACL 1000-0.9 UT/500ML-% IV SOLN
INTRAVENOUS | Status: DC | PRN
  Administered 2018-05-17 (×3): 500 mL

## 2018-05-17 MED ORDER — SODIUM CHLORIDE 0.9% FLUSH: 3.0000 mL | Freq: Two times a day (BID) | INTRAVENOUS | Status: DC

## 2018-05-17 MED ORDER — HEPARIN (PORCINE) IN NACL 1000-0.9 UT/500ML-% IV SOLN
INTRAVENOUS | Status: DC | PRN
  Administered 2018-05-17: 1500 mL

## 2018-05-17 MED ORDER — FENTANYL CITRATE (PF) 100 MCG/2ML IJ SOLN
50.0000 ug | INTRAMUSCULAR | Status: DC | PRN
  Administered 2018-05-18: 50 ug via INTRAVENOUS
  Filled 2018-05-17: qty 2

## 2018-05-17 MED ORDER — NITROGLYCERIN 1 MG/10 ML FOR IR/CATH LAB
INTRA_ARTERIAL | Status: DC | PRN
  Administered 2018-05-17: 200 ug via INTRACORONARY

## 2018-05-17 MED ORDER — NOREPINEPHRINE 4 MG/250ML-% IV SOLN
0.0000 ug/min | INTRAVENOUS | Status: DC
  Administered 2018-05-17: 26 ug/min via INTRAVENOUS
  Administered 2018-05-17: 28 ug/min via INTRAVENOUS
  Administered 2018-05-17: 24 ug/min via INTRAVENOUS
  Administered 2018-05-17: 28 ug/min via INTRAVENOUS
  Administered 2018-05-18: 40 ug/min via INTRAVENOUS
  Administered 2018-05-18: 24 ug/min via INTRAVENOUS
  Administered 2018-05-18: 22 ug/min via INTRAVENOUS
  Filled 2018-05-17 (×8): qty 250

## 2018-05-17 MED ORDER — SODIUM CHLORIDE 0.9 % IV BOLUS
1000.0000 mL | INTRAVENOUS | Status: DC | PRN
  Administered 2018-05-17: 1000 mL via INTRAVENOUS

## 2018-05-17 MED ORDER — TICAGRELOR 90 MG PO TABS
90.0000 mg | ORAL_TABLET | Freq: Two times a day (BID) | ORAL | Status: DC
  Administered 2018-05-17: 90 mg via ORAL
  Filled 2018-05-17: qty 1

## 2018-05-17 MED ORDER — HEPARIN SODIUM (PORCINE) 1000 UNIT/ML IJ SOLN
INTRAMUSCULAR | Status: AC
  Filled 2018-05-17: qty 1

## 2018-05-17 MED ORDER — NITROGLYCERIN IN D5W 200-5 MCG/ML-% IV SOLN: 0.0000 ug/min | INTRAVENOUS | Status: DC

## 2018-05-17 MED ORDER — LIDOCAINE HCL (PF) 1 % IJ SOLN
INTRAMUSCULAR | Status: AC
  Filled 2018-05-17: qty 30

## 2018-05-17 MED ORDER — PROMETHAZINE HCL 25 MG/ML IJ SOLN
6.2500 mg | Freq: Once | INTRAMUSCULAR | Status: AC
  Administered 2018-05-17: 6.25 mg via INTRAVENOUS
  Filled 2018-05-17: qty 1

## 2018-05-17 MED ORDER — TICAGRELOR 90 MG PO TABS
ORAL_TABLET | ORAL | Status: DC | PRN
  Administered 2018-05-17: 180 mg

## 2018-05-17 MED ORDER — TICAGRELOR 90 MG PO TABS
ORAL_TABLET | ORAL | Status: AC
  Filled 2018-05-17: qty 2

## 2018-05-17 MED ORDER — NOREPINEPHRINE BITARTRATE 1 MG/ML IV SOLN
INTRAVENOUS | Status: AC
  Filled 2018-05-17: qty 4

## 2018-05-17 MED ORDER — INSULIN ASPART 100 UNIT/ML ~~LOC~~ SOLN
0.0000 [IU] | SUBCUTANEOUS | Status: DC
  Administered 2018-05-17: 3 [IU] via SUBCUTANEOUS
  Administered 2018-05-17: 5 [IU] via SUBCUTANEOUS
  Administered 2018-05-17: 7 [IU] via SUBCUTANEOUS
  Administered 2018-05-17: 5 [IU] via SUBCUTANEOUS
  Administered 2018-05-18: 2 [IU] via SUBCUTANEOUS
  Filled 2018-05-17: qty 0.09

## 2018-05-17 MED ORDER — NOREPINEPHRINE 4 MG/250ML-% IV SOLN
INTRAVENOUS | Status: AC
  Filled 2018-05-17: qty 250

## 2018-05-17 MED ORDER — HEPARIN (PORCINE) 25000 UT/250ML-% IV SOLN
700.0000 [IU]/h | INTRAVENOUS | Status: DC
  Administered 2018-05-17: 700 [IU]/h via INTRAVENOUS

## 2018-05-17 MED ORDER — EPINEPHRINE PF 1 MG/10ML IJ SOSY
PREFILLED_SYRINGE | INTRAMUSCULAR | Status: DC | PRN
  Administered 2018-05-17 (×2): 1 mg via INTRAVENOUS

## 2018-05-17 MED ORDER — VERAPAMIL HCL 2.5 MG/ML IV SOLN
INTRAVENOUS | Status: DC | PRN
  Administered 2018-05-17: 3 mg via INTRA_ARTERIAL

## 2018-05-17 MED ORDER — ETOMIDATE 2 MG/ML IV SOLN
INTRAVENOUS | Status: DC | PRN
Start: 2018-05-17 — End: 2018-05-17
  Administered 2018-05-17: 20 mg via INTRAVENOUS

## 2018-05-17 MED ORDER — FUROSEMIDE 10 MG/ML IJ SOLN
60.0000 mg | Freq: Once | INTRAMUSCULAR | Status: AC
  Administered 2018-05-17: 60 mg via INTRAVENOUS
  Filled 2018-05-17: qty 6

## 2018-05-17 MED ORDER — PROPOFOL 1000 MG/100ML IV EMUL
5.0000 ug/kg/min | INTRAVENOUS | Status: DC
  Administered 2018-05-17: 10 ug/kg/min via INTRAVENOUS

## 2018-05-17 MED ORDER — MIDAZOLAM HCL 2 MG/2ML IJ SOLN
INTRAMUSCULAR | Status: DC | PRN
  Administered 2018-05-17: 2 mg via INTRAVENOUS

## 2018-05-17 MED ORDER — FENTANYL CITRATE (PF) 100 MCG/2ML IJ SOLN
50.0000 ug | INTRAMUSCULAR | Status: AC | PRN
  Administered 2018-05-17 – 2018-05-18 (×3): 50 ug via INTRAVENOUS
  Filled 2018-05-17 (×4): qty 2

## 2018-05-17 MED ORDER — SODIUM ZIRCONIUM CYCLOSILICATE 10 G PO PACK
10.0000 g | PACK | Freq: Once | ORAL | Status: AC
  Administered 2018-05-17: 10 g via ORAL
  Filled 2018-05-17: qty 1

## 2018-05-17 SURGICAL SUPPLY — 18 items
BALLN IABP SENSA PLUS 7.5F 40C (BALLOONS) ×2
BALLOON IABP SENS PLUS 7.5F40C (BALLOONS) IMPLANT
CATH SWAN GANZ VIP 7.5F (CATHETERS) ×1 IMPLANT
FEM STOP ARCH (HEMOSTASIS) ×1
KIT ESSENTIALS PG (KITS) ×2 IMPLANT
PACK CARDIAC CATHETERIZATION (CUSTOM PROCEDURE TRAY) ×2 IMPLANT
PROTECTION STATION PRESSURIZED (MISCELLANEOUS) ×2
SHEATH AVANTI 11F 11CM (SHEATH) ×1 IMPLANT
SHEATH PINNACLE 8F 10CM (SHEATH) ×1 IMPLANT
SHEATH PINNACLE 9F 10CM (SHEATH) ×1 IMPLANT
SLEEVE REPOSITIONING LENGTH 30 (MISCELLANEOUS) ×1 IMPLANT
STATION PROTECTION PRESSURIZED (MISCELLANEOUS) IMPLANT
SYSTEM COMPRESSION FEMOSTOP (HEMOSTASIS) IMPLANT
TRANSDUCER W/STOPCOCK (MISCELLANEOUS) ×2 IMPLANT
TUBING ART PRESS 72  MALE/FEM (TUBING) ×2
TUBING ART PRESS 72 MALE/FEM (TUBING) IMPLANT
WIRE EMERALD 3MM-J .025X260CM (WIRE) ×1 IMPLANT
WIRE EMERALD 3MM-J .035X150CM (WIRE) ×1 IMPLANT

## 2018-05-17 SURGICAL SUPPLY — 20 items
BALLN IABP SENSA PLUS 8F 50CC (BALLOONS) ×3
BALLN TREK RX 2.5X12 (BALLOONS) ×3
BALLOON IABP SENS PLUS 8F 50CC (BALLOONS) IMPLANT
BALLOON TREK RX 2.5X12 (BALLOONS) IMPLANT
CATH INFINITI JR4 5F (CATHETERS) ×2 IMPLANT
CATH LAUNCHER 6FR EBU3.5 (CATHETERS) ×2 IMPLANT
DEVICE INFLAT 30 PLUS (MISCELLANEOUS) ×2 IMPLANT
DEVICE SAFEGUARD 24CM (GAUZE/BANDAGES/DRESSINGS) ×2 IMPLANT
GLIDESHEATH SLEND SS 6F .021 (SHEATH) ×2 IMPLANT
KIT MANI 3VAL PERCEP (MISCELLANEOUS) ×3 IMPLANT
KIT TRANSPAC II SGL 4260605 (MISCELLANEOUS) ×2 IMPLANT
NDL PERC 18GX7CM (NEEDLE) IMPLANT
NEEDLE PERC 18GX7CM (NEEDLE) ×3 IMPLANT
PACK CARDIAC CATH (CUSTOM PROCEDURE TRAY) ×3 IMPLANT
SHEATH AVANTI 6FR X 11CM (SHEATH) ×2 IMPLANT
STENT SYNERGY DES 2.75X32 (Permanent Stent) ×2 IMPLANT
SUT SILK 0 FSL (SUTURE) ×2 IMPLANT
WIRE GUIDERIGHT .035X150 (WIRE) ×2 IMPLANT
WIRE ROSEN-J .035X260CM (WIRE) ×2 IMPLANT
WIRE RUNTHROUGH .014X180CM (WIRE) ×2 IMPLANT

## 2018-05-17 NOTE — Progress Notes (Signed)
Patient left floor with Dr. Fletcher Anon and charge nurse for cath lab.

## 2018-05-17 NOTE — Progress Notes (Signed)
Dr. Hassell Done at bedside discussing [place]. She reviewed CXR and IABP in stable position compared to last CXR. Aware of absent pedal pulses in R foot, continue to monitor until [place] can be discussed with family and Dr. Tamala Julian. Continue to monitor UOP closely, with PRN fluid boluses as needed to encourage renal perfusion.  Lokelma ordered for K 5.9 per tube, dosed 10 mg per pharmacy. Order placed and will be given to patient.  CBC, BMP, and heparin level already ordered for AM labs.

## 2018-05-17 NOTE — Progress Notes (Signed)
  Echocardiogram 2D Echocardiogram has been performed.  Penni Penado L Androw 04/28/2018, 9:03 AM

## 2018-05-17 NOTE — Progress Notes (Signed)
Notified on call STEMI Cardiologist Dr. Fletcher Anon via telephone regarding current EKG revealing ST elevation in leads V4 and V5 concerning for STEMI, pts troponin increased from 0.62 to 12.77, pt continues to c/o central chest pain with radiation to the bilateral arms and back despite nitroglycerin gtt and prn morphine. Dr. Fletcher Anon to review EKG's.  Will continue to monitor and assess pt.  Marda Stalker, [place] Pager [phone] (please enter 7 digits) PCCM Consult Pager [phone] (please enter 7 digits)

## 2018-05-17 NOTE — Consult Note (Addendum)
NAME:  Valerie Foster, MRN:  [id number], DOB:  [birth year], LOS: 0 ADMISSION DATE:  04/29/2018, CONSULTATION DATE:  3/24 REFERRING MD:  Dr. Tamala Julian, CHIEF COMPLAINT:  MI   Brief History   85 year old female who presented with STEMI and had LAD occlusion treated with DES. 10 minute PEA arrest during cath.   History of present illness   Patient is encephalopathic and/or intubated. Therefore history has been obtained from chart review. Per Dr. Mortimer Fries initial consultation 3/23. This is an 85 yo female with a PMH of Palpitations, HTN, Hypercholesteremia, CAD, Dysrhythmias, Diverticulitis, Type II Diabetes Mellitus, Anginal Pain, and Arthritis.  She presented to [facility] ER on 03/23 via EMS from home with c/o intermittent central chest pain with radiation to her back and bilateral arms onset the afternoon of 03/23.  Per ER notes the pt stated she lifted weights for the first time on 03/23.  She also endorsed feeling clammy and nauseated.  EMS administered aspirin and zofran.  In the ER initial and repeat EKG revealed mild ST elevations in anterior leads with no reciprocal changes, pt did not meet STEMI criteria.  She received sublingual nitroglycerin with resolution of chest pain, however she continued to c/o upper back pain.  CTA Chest negative for acute cardiopulmonary abnormality, however initial troponin 0.04.  Pt ruled in for possible STEMI and nitroglycerin gtt initiated for chest pain.  Cardiology was consulted and she was taken to the cath lab, where she was found to have LAD occlusion which was treated with DES. She unfortunately suffered a 10 minute PEA arrest as part of the reperfusion process (there is some question as to if she truly had arrest). She developed shocks and IABP was placed. She was transferred to [facility] for further evaluation.   Past Medical History   has a past medical history of Anginal pain ([place]), Arthritis, Coronary artery disease, Diabetes mellitus without complication ([place]),  Diverticulitis, Dysrhythmia, Heart disease, Hypercholesteremia, Hypertension, and Palpitations.  Significant Hospital Events   3/23 admit 3/24 STEMI, quasi-arrest, Intuabtion, IABP, cath lab for DES, transfer to [facility].   Consults:    Procedures:  ETT 3/24 > R groin IABP 3/24 >  Significant Diagnostic Tests:  LHC 3/24 > LAD occlusion, DS, IABP.  Echo 3/24 >  Micro Data:    Antimicrobials:    Interim history/subjective:    Objective   Blood pressure 94/81, pulse (!) 107, temperature 98.5 F (36.9 C), temperature source Oral, resp. rate (!) 21, SpO2 100 %.    Vent Mode: AC FiO2 (%):  [100 %] 100 % Set Rate:  [18 bmp] 18 bmp Vt Set:  [470 mL] 470 mL PEEP:  [5 cmH20] 5 cmH20  No intake or output data in the 24 hours ending 04/30/2018 0840 There were no vitals filed for this visit.  Examination: General: Elderly appearing female on vent HENT: [place]/AT, PERRL Lungs: Clear Cardiovascular: RRR, no mechanical sounds of IABP Abdomen: Soft, ND, normoactive Extremities: No acute deformity, no significant edema.  Neuro: Sedated GU: Foley  Resolved Hospital Problem list     Assessment & Plan:   Shock: likely cardiogenic in the setting of STEMI.  - Cardiology primary - Norepinephrine infusion to keep MAP > 11mmHg - To cath lab to reposition IABP - Echo pending  STEMI - Cardiology primary - S/p DES to LAD 3/24 - AC/antiplatelets per cardiology  Acute hypoxemic respiratory failure secondary to near cardiac arrest - Full vent support - ABG reviewed, and settings adjusted.  -  Follow CXR, ABG.  - VAP bundle - Propofol for sedation weaned to RASS 0 to -1.   Metabolic acidosis suspect lactic acidosis although no result - Repeat CMP - Assess lactic - BP now under better control  DM - CBG monitoring and SSI  Best practice:  Diet: NPO Pain/Anxiety/Delirium protocol (if indicated): Propofol VAP protocol (if indicated): Per protocol DVT prophylaxis: heparin infusion GI  prophylaxis: Pepcid Glucose control: SSI Mobility: BR Code Status: FULL Family Communication: no family present Disposition: ICU, to cath lab.   Labs   CBC: Recent Labs  Lab 05/16/18 1839 05/20/2018 0148  WBC 9.5 12.1*  NEUTROABS 6.9  --   HGB 13.2 13.5  HCT 42.1 43.4  MCV 85.9 85.6  PLT 299 680    Basic Metabolic Panel: Recent Labs  Lab 05/16/18 1839 04/29/2018 0148  NA 138 139  K 3.7 4.1  CL 104 107  CO2 25 20*  GLUCOSE 139* 233*  BUN 29* 25*  CREATININE 1.70* 1.47*  CALCIUM 9.0 8.9   GFR: Estimated Creatinine Clearance: 31.3 mL/min (A) (by C-G formula based on SCr of 1.47 mg/dL (H)). Recent Labs  Lab 05/16/18 1839 05/02/2018 0148  WBC 9.5 12.1*    Liver Function Tests: No results for input(s): AST, ALT, ALKPHOS, BILITOT, PROT, ALBUMIN in the last 168 hours. No results for input(s): LIPASE, AMYLASE in the last 168 hours. No results for input(s): AMMONIA in the last 168 hours.  ABG No results found for: PHART, PCO2ART, PO2ART, HCO3, TCO2, ACIDBASEDEF, O2SAT   Coagulation Profile: Recent Labs  Lab 05/16/18 2045  INR 1.0    Cardiac Enzymes: Recent Labs  Lab 05/16/18 1839 05/16/18 2153 04/29/2018 0148  TROPONINI 0.04* 0.62* 12.77*    HbA1C: Hgb A1c MFr Bld  Date/Time Value Ref Range Status  01/05/2018 11:36 AM 6.9 (H) 4.8 - 5.6 % Final    Comment:             Prediabetes: 5.7 - 6.4          Diabetes: >6.4          Glycemic control for adults with diabetes: <7.0   08/12/2017 04:04 PM 7.1 (H) 4.8 - 5.6 % Final    Comment:             Prediabetes: 5.7 - 6.4          Diabetes: >6.4          Glycemic control for adults with diabetes: <7.0     CBG: Recent Labs  Lab 05/16/18 2134  GLUCAP 150*    Review of Systems:   Unable as patient is intubated and encephalopathic  Past Medical History  She,  has a past medical history of Anginal pain ([place]), Arthritis, Coronary artery disease, Diabetes mellitus without complication ([place]),  Diverticulitis, Dysrhythmia, Heart disease, Hypercholesteremia, Hypertension, and Palpitations.   Surgical History    Past Surgical History:  Procedure Laterality Date  . CARDIAC SURGERY    . CATARACT EXTRACTION W/PHACO Left 01/28/2015   Procedure: CATARACT EXTRACTION PHACO AND INTRAOCULAR LENS PLACEMENT (IOC);  Surgeon: Estill Cotta, MD;  Location: [facility] ORS;  Service: Ophthalmology;  Laterality: Left;  [place] 01:13 AP% 58.5 CDE 30.55 fluid pack lot # [id number]  . CATARACT EXTRACTION W/PHACO Right 03/18/2015   Procedure: CATARACT EXTRACTION PHACO AND INTRAOCULAR LENS PLACEMENT (IOC);  Surgeon: Estill Cotta, MD;  Location: [facility] ORS;  Service: Ophthalmology;  Laterality: Right;  [place]    1:08.3 AP%   24 CDE   30.33  fluid pack lot # [id number]  . CORONARY ANGIOPLASTY     stent  . CORONARY STENT PLACEMENT  06/17/2011  . CORONARY/GRAFT ACUTE MI REVASCULARIZATION N/A 04/29/2018   Procedure: Coronary/Graft Acute MI Revascularization;  Surgeon: Wellington Hampshire, MD;  Location: [place] [facility];  Service: Cardiovascular;  Laterality: N/A;  . FINGER SURGERY     for trigger finger  . IABP INSERTION N/A 05/03/2018   Procedure: IABP Insertion;  Surgeon: Wellington Hampshire, MD;  Location: [place] [facility];  Service: Cardiovascular;  Laterality: N/A;  . LEFT HEART CATH AND CORONARY ANGIOGRAPHY N/A 05/02/2018   Procedure: LEFT HEART CATH AND CORONARY ANGIOGRAPHY;  Surgeon: Wellington Hampshire, MD;  Location: [place] [facility];  Service: Cardiovascular;  Laterality: N/A;  . TONSILLECTOMY       Social History   reports that she has never smoked. She has never used smokeless tobacco. She reports that she does not drink alcohol or use drugs.   Family History   Her family history includes Colon cancer in her sister; Diabetes in her mother; Heart attack in her father; Hypertension in her mother.   Allergies Allergies  Allergen Reactions  . Sulfa Antibiotics Swelling    Swelling of throat  .  Statins Other (See Comments)    Reaction: Joint and muscle pain severe     Home Medications  Prior to Admission medications   Medication Sig Start Date End Date Taking? Authorizing Provider  amLODipine (NORVASC) 5 MG tablet Take 1 tablet (5 mg total) by mouth daily. 04/29/18   Virginia Crews, MD  aspirin 81 MG tablet Take 81 mg by mouth daily.     [provider]  cetirizine (ZYRTEC) 10 MG tablet Take 1 tablet (10 mg total) by mouth daily. Patient taking differently: Take 10 mg by mouth daily as needed for allergies or rhinitis.  08/18/14   Betancourt, Aura Fey, NP  diltiazem (DILACOR XR) 120 MG 24 hr capsule Take 1 capsule (120 mg total) by mouth daily. 02/03/18   Jerrol Banana., MD  ezetimibe (ZETIA) 10 MG tablet TAKE 1 TABLET BY MOUTH EVERY DAY Patient taking differently: Take 10 mg by mouth daily.  04/22/18   Jerrol Banana., MD  glucose blood test strip Use as instructed 01/04/18   Jerrol Banana., MD  LORazepam (ATIVAN) 0.5 MG tablet Take 1 tablet (0.5 mg total) by mouth 2 (two) times daily as needed for anxiety. 02/03/18   Jerrol Banana., MD  metoprolol succinate (TOPROL-XL) 50 MG 24 hr tablet TAKE 1 TABLET BY MOUTH EVERY DAY Patient taking differently: Take 50 mg by mouth at bedtime.  09/23/17   Jerrol Banana., MD  olmesartan (BENICAR) 40 MG tablet TAKE 1 TABLET BY MOUTH EVERY DAY Patient taking differently: Take 40 mg by mouth every evening.  03/01/18   Jerrol Banana., MD  traZODone (DESYREL) 50 MG tablet TAKE 1 TABLET BY MOUTH EVERYDAY AT BEDTIME Patient taking differently: Take 50 mg by mouth at bedtime.  04/05/18   Jerrol Banana., MD     Critical care time: 22 mins     Georgann Housekeeper, AGACNP-BC [place] Pager [phone] or [phone]  05/14/2018 9:12 AM  Attending Note:  85 year old female with extensive PMH who suffered a PEA arrest with a STEMI.  PCCM consulted for respiratory  failure.  On exam, unresponsive with clear lungs bilaterally.  Discussed with PCCM-NP.  Patient  suffered a cardiac arrest this AM.  Now to the cath lab.  Will defer MI management to cards.  Pressors for BP support.  Continue full vent support.  Adjust vent for ABG.  PCCM will continue to follow.  The patient is critically ill with multiple organ systems failure and requires high complexity decision making for assessment and support, frequent evaluation and titration of therapies, application of advanced monitoring technologies and extensive interpretation of multiple databases.   Critical Care Time devoted to patient care services described in this note is  45  Minutes. This time reflects time of care of this signee Dr Jennet Maduro. This critical care time does not reflect procedure time, or teaching time or supervisory time of PA/NP/Med student/Med Resident etc but could involve care discussion time.  Rush Farmer, M.D. [facility] Pulmonary/Critical Care Medicine. Pager: [phone]. After hours pager: [phone].

## 2018-05-17 NOTE — H&P (View-Only) (Signed)
CARDIOLOGY NOTE   Developed hemodynamic difficulty after repeated balloon pump alarm and malfunction.  There is bruising around the balloon pump.  There is a tendency for the balloon pump to move backwards.  Alarm signal suggesting a pump leak is repetitive.  Emergency 2D Doppler echocardiogram currently being done to assess LV function and to exclude pericardial effusion.  IV Levophed is being administered to support blood pressure.  Plan return to Cath Lab for exchange of intra-aortic balloon pump.  Consider Impella.  Have spoken with the family and informed them of poor overall clinical condition and guarded prognosis.

## 2018-05-17 NOTE — Progress Notes (Addendum)
CARDIOLOGY FOLLOW-UP   Bedside echo demonstrates apical akinesis.  Estimated EF approximately 40%.  RV appears relatively small.  No pericardial effusion  High-dose Levophed is being used to support blood pressure.  Balloon pump is malfunctioning.  Increase IV fluid for the time being.  Non-augmented blood pressure 70/60 mmHg.  Return to Cath Lab for change out of balloon pump downsizing to 40 cm.  Also upgrade arterial sheath size to 9 [place] to prevent groin bleeding.  Have also requested that the Swan-[place] be placed to assess filling pressures.  This will be helpful in initial management.  Critical care team is on board.  Critical care 35 minutes.

## 2018-05-17 NOTE — Progress Notes (Signed)
Patient has been having N/V and chest pain since admission. Medicated with po pain medications and phenergan x 2. Called out to nurse's station to use the bedpan. Had urinary incontinent episode. Changed patient gown and bed and patient still complaining of N/V and chest pain. 2L O2 applied for comfort. Administered zofran with morphine. NP aware and ordered another EKG.  On Heparin and Nitroglycerin gtt.  EKG shows more ST elevation than before. Troponin is 12.77. NP notified Cardiology.Contiue to monitor.

## 2018-05-17 NOTE — H&P (Signed)
Cardiology Consultation:   Patient ID: Valerie Foster MRN: [id number]; DOB: [birth year]  Admit date: 05/16/2018 Date of Consult: 05/21/2018  Primary Care Provider: Jerrol Banana., MD Primary Cardiologist: Dr. Clayborn Bigness    Patient Profile:   Mayrani Khamis is a 85 y.o. female with a hx of coronary artery disease status post LAD stent who is being seen today for the evaluation of anterolateral ST elevation myocardial infarction at the request of Marda Stalker.  History of Present Illness:   Ms. Lorenz is an 85 year old female with known history of coronary artery disease with previous LAD stent in 2013, type 2 diabetes, hypertension and hyperlipidemia.  She presented with chest pain which started yesterday around noontime but worsened throughout the day.  Initial EKG showed minor ST elevation in V1 V2.  The patient was admitted to the ICU and started on heparin drip and nitroglycerin drip.  According to the documentation, she continued to have significant chest pain throughout her stay but was noted to have more ST changes on the monitor after 3 AM.  At this point, troponin came back to be 12.  A repeat EKG showed clear anterolateral ST elevation and thus I was contacted.  After reviewed the chart and EKG, I activated a code STEMI.  By the time that I arrived to see the patient, she was having significant cough and orthopnea.  Her blood pressure was also noted to be gradually decreasing and was around 100 to 176 mmHg systolic before starting cardiac catheterization.  Past Medical History:  Diagnosis Date  . Anginal pain ([place])   . Arthritis   . Coronary artery disease   . Diabetes mellitus without complication ([place])   . Diverticulitis   . Dysrhythmia   . Heart disease   . Hypercholesteremia   . Hypertension   . Palpitations     Past Surgical History:  Procedure Laterality Date  . CARDIAC SURGERY    . CATARACT EXTRACTION W/PHACO Left 01/28/2015   Procedure: CATARACT EXTRACTION PHACO  AND INTRAOCULAR LENS PLACEMENT (IOC);  Surgeon: Estill Cotta, MD;  Location: [facility] ORS;  Service: Ophthalmology;  Laterality: Left;  [place] 01:13 AP% 58.5 CDE 30.55 fluid pack lot # [id number]  . CATARACT EXTRACTION W/PHACO Right 03/18/2015   Procedure: CATARACT EXTRACTION PHACO AND INTRAOCULAR LENS PLACEMENT (IOC);  Surgeon: Estill Cotta, MD;  Location: [facility] ORS;  Service: Ophthalmology;  Laterality: Right;  [place]    1:08.3 AP%   24 CDE   30.33 fluid pack lot # [id number]  . CORONARY ANGIOPLASTY     stent  . CORONARY STENT PLACEMENT  06/17/2011  . FINGER SURGERY     for trigger finger  . TONSILLECTOMY       Home Medications:  Prior to Admission medications   Medication Sig Start Date End Date Taking? Authorizing Provider  amLODipine (NORVASC) 5 MG tablet Take 1 tablet (5 mg total) by mouth daily. 04/29/18  Yes Bacigalupo, Dionne Bucy, MD  aspirin 81 MG tablet Take 81 mg by mouth daily.    Yes [provider]  cetirizine (ZYRTEC) 10 MG tablet Take 1 tablet (10 mg total) by mouth daily. Patient taking differently: Take 10 mg by mouth daily as needed for allergies or rhinitis.  08/18/14  Yes Betancourt, Aura Fey, NP  diltiazem (DILACOR XR) 120 MG 24 hr capsule Take 1 capsule (120 mg total) by mouth daily. 02/03/18  Yes Jerrol Banana., MD  ezetimibe (ZETIA) 10 MG tablet TAKE 1 TABLET BY MOUTH EVERY DAY Patient  taking differently: Take 10 mg by mouth daily.  04/22/18  Yes Jerrol Banana., MD  LORazepam (ATIVAN) 0.5 MG tablet Take 1 tablet (0.5 mg total) by mouth 2 (two) times daily as needed for anxiety. 02/03/18  Yes Jerrol Banana., MD  metoprolol succinate (TOPROL-XL) 50 MG 24 hr tablet TAKE 1 TABLET BY MOUTH EVERY DAY Patient taking differently: Take 50 mg by mouth at bedtime.  09/23/17  Yes Jerrol Banana., MD  olmesartan (BENICAR) 40 MG tablet TAKE 1 TABLET BY MOUTH EVERY DAY Patient taking differently: Take 40 mg by mouth every evening.  03/01/18  Yes Jerrol Banana., MD  traZODone (DESYREL) 50 MG tablet TAKE 1 TABLET BY MOUTH EVERYDAY AT BEDTIME Patient taking differently: Take 50 mg by mouth at bedtime.  04/05/18  Yes Jerrol Banana., MD  glucose blood test strip Use as instructed 01/04/18   Jerrol Banana., MD    Inpatient Medications: Scheduled Meds: . [MAR Hold] amLODipine  5 mg Oral Daily  . [MAR Hold] aspirin EC  81 mg Oral Daily  . [MAR Hold] Chlorhexidine Gluconate Cloth  6 each Topical Daily  . [MAR Hold] diltiazem  120 mg Oral Daily  . [MAR Hold] ezetimibe  10 mg Oral Daily  . [MAR Hold] fesoterodine  4 mg Oral Daily  . [MAR Hold] hydrALAZINE  25 mg Oral TID  . [MAR Hold] insulin aspart  0-5 Units Subcutaneous QHS  . [MAR Hold] insulin aspart  0-9 Units Subcutaneous TID WC  . [MAR Hold] irbesartan  300 mg Oral Daily  . [MAR Hold] metoprolol succinate  50 mg Oral Daily  . [MAR Hold] pravastatin  10 mg Oral Daily  . [MAR Hold] traZODone  50-100 mg Oral QHS   Continuous Infusions: . heparin 850 Units/hr (05/07/2018 0400)  . heparin 700 Units/hr (05/02/2018 0616)  . nitroGLYCERIN Stopped (04/27/2018 0438)  . norepinephrine (LEVOPHED) 4mg  / 220mL infusion 10 mcg/min (05/06/2018 0508)   PRN Meds: [YBO Hold] acetaminophen **OR** [MAR Hold] acetaminophen, EPINEPHrine, etomidate, Heparin (Porcine) in NaCl, heparin, heparin, [MAR Hold] HYDROcodone-acetaminophen, iopamidol, [MAR Hold] LORazepam, midazolam, [MAR Hold]  morphine injection, [MAR Hold] nitroGLYCERIN, nitroGLYCERIN, norepinephrine (LEVOPHED) 4mg  / 241mL infusion, [MAR Hold] ondansetron **OR** [MAR Hold] ondansetron (ZOFRAN) IV, [MAR Hold] promethazine, propofol, succinylcholine, ticagrelor, verapamil  Allergies:    Allergies  Allergen Reactions  . Sulfa Antibiotics Swelling    Swelling of throat  . Statins Other (See Comments)    Reaction: Joint and muscle pain severe    Social History:   Social History   Socioeconomic History  . Marital status: Widowed     Spouse name: Not on file  . Number of children: 2  . Years of education: Not on file  . Highest education level: Associate degree: occupational, Hotel manager, or vocational program  Occupational History  . Occupation: retired  Scientific laboratory technician  . Financial resource strain: Not hard at all  . Food insecurity:    Worry: Never true    Inability: Never true  . Transportation needs:    Medical: No    Non-medical: No  Tobacco Use  . Smoking status: Never Smoker  . Smokeless tobacco: Never Used  Substance and Sexual Activity  . Alcohol use: No  . Drug use: No  . Sexual activity: Not on file  Lifestyle  . Physical activity:    Days per week: 5 days    Minutes per session: 20 min  . Stress: Only  a little  Relationships  . Social connections:    Talks on phone: Patient refused    Gets together: Patient refused    Attends religious service: Patient refused    Active member of club or organization: Patient refused    Attends meetings of clubs or organizations: Patient refused    Relationship status: Patient refused  . Intimate partner violence:    Fear of current or ex partner: Patient refused    Emotionally abused: Patient refused    Physically abused: Patient refused    Forced sexual activity: Patient refused  Other Topics Concern  . Not on file  Social History Narrative  . Not on file    Family History:    Family History  Problem Relation Age of Onset  . Hypertension Mother   . Diabetes Mother   . Heart attack Father   . Colon cancer Sister      ROS:  Please see the history of present illness.   All other ROS reviewed and negative.     Physical Exam/Data:   Vitals:   05/16/2018 0415 05/06/2018 0435 04/24/2018 0500 05/07/2018 0554  BP: 133/77  (!) 167/55 (!) 181/72  Pulse: (!) 103     Resp: (!) 26     Temp:      TempSrc:      SpO2: 93% 93%  100%  Weight:      Height:        Intake/Output Summary (Last 24 hours) at 05/13/2018 0641 Last data filed at 05/16/2018 0415  Gross per 24 hour  Intake 121.88 ml  Output 100 ml  Net 21.88 ml   Last 3 Weights 05/16/2018 05/16/2018 04/14/2018  Weight (lbs) 172 lb 13.5 oz 170 lb 173 lb  Weight (kg) 78.4 kg 77.111 kg 78.472 kg     Body mass index is 27.07 kg/m.  General:  Well nourished, well developed, in mild respiratory distress. HEENT: normal Lymph: no adenopathy Neck: no JVD Endocrine:  No thryomegaly Vascular: No carotid bruits; FA pulses 2+ bilaterally without bruits  Cardiac:  normal S1, S2; RRR; no murmur  Lungs: Mild respiratory distress with bilateral crackles and expiratory wheezing. Abd: soft, nontender, no hepatomegaly  Ext: no edema Musculoskeletal:  No deformities, BUE and BLE strength normal and equal Skin: warm and dry  Neuro:  CNs 2-12 intact, no focal abnormalities noted Psych:  Normal affect   EKG:  The EKG was personally reviewed and demonstrates: Normal sinus rhythm with anterolateral ST elevation Telemetry:  Telemetry was personally reviewed and demonstrates: Normal sinus rhythm with no significant arrhythmia  Relevant CV Studies:   Laboratory Data:  Chemistry Recent Labs  Lab 05/16/18 1839 04/26/2018 0148  NA 138 139  K 3.7 4.1  CL 104 107  CO2 25 20*  GLUCOSE 139* 233*  BUN 29* 25*  CREATININE 1.70* 1.47*  CALCIUM 9.0 8.9  GFRNONAA 27* 33*  GFRAA 32* 38*  ANIONGAP 9 12    No results for input(s): PROT, ALBUMIN, AST, ALT, ALKPHOS, BILITOT in the last 168 hours. Hematology Recent Labs  Lab 05/16/18 1839 05/08/2018 0148  WBC 9.5 12.1*  RBC 4.90 5.07  HGB 13.2 13.5  HCT 42.1 43.4  MCV 85.9 85.6  MCH 26.9 26.6  MCHC 31.4 31.1  RDW 13.0 13.0  PLT 299 291   Cardiac Enzymes Recent Labs  Lab 05/16/18 1839 05/16/18 2153 05/17/18 0148  TROPONINI 0.04* 0.62* 12.77*   No results for input(s): TROPIPOC in the last 168 hours.  BNPNo  results for input(s): BNP, PROBNP in the last 168 hours.  DDimer No results for input(s): DDIMER in the last 168 hours.   Radiology/Studies:  Dg Chest Portable 1 View  Result Date: 05/16/2018 CLINICAL DATA:  Chest pain EXAM: PORTABLE CHEST 1 VIEW COMPARISON:  Jul 16, 2015 FINDINGS: There is no appreciable edema or consolidation. The heart size and pulmonary vascularity are normal. No adenopathy. No bone lesions. There is aortic atherosclerosis. IMPRESSION: No edema or consolidation. Heart size within normal limits. Aortic Atherosclerosis (ICD10-I70.0). Electronically Signed   By: Lowella Grip III M.D.   On: 05/16/2018 18:59   Ct Angio Chest Aorta W And/or Wo Contrast  Result Date: 05/16/2018 CLINICAL DATA:  Central chest pain radiating to the back and both arms. Patient was lifting weights this morning for the first time in a while. History of cardiac stent and hypertension. EXAM: CT ANGIOGRAPHY CHEST WITH CONTRAST TECHNIQUE: Multidetector CT imaging of the chest was performed using the standard protocol during bolus administration of intravenous contrast. Multiplanar CT image reconstructions and MIPs were obtained to evaluate the vascular anatomy. CONTRAST:  26mL OMNIPAQUE IOHEXOL 350 MG/ML SOLN COMPARISON:  MRI 08/15/2008 FINDINGS: Cardiovascular: LAD stent noted. Normal heart size without pericardial effusion or thickening. Aortic atherosclerosis without aneurysm. Probable soft plaque along the periphery of the right brachiocephalic artery, series 8/41 slightly narrowing the lumen. The unenhanced pulmonary vasculature is unremarkable. Mediastinum/Nodes: No enlarged mediastinal, hilar, or axillary lymph nodes. Small hiatal hernia. Thyroid gland, trachea, and esophagus demonstrate no significant findings. Lungs/Pleura: Dependent bibasilar atelectasis. No pneumothorax, effusion or pulmonary consolidations. No dominant mass. Upper Abdomen: Redemonstration of hypodense bilateral adrenal adenomas, largest on the right measuring up to 2 cm. Musculoskeletal: Thoracic spondylosis. Osteoarthritis osteoarthritis of the  sternoclavicular joints, right greater than left. Intact manubrium and sternum. No suspicious osseous lesions. Review of the MIP images confirms the above findings. IMPRESSION: 1. No acute cardiopulmonary abnormality. 2. Stent noted within the LAD. 3. Thoracic spondylosis. Osteoarthritis of the sternoclavicular joints, right greater than left. 4. Stable bilateral adrenal adenomas. Aortic Atherosclerosis (ICD10-I70.0). Electronically Signed   By: Ashley Royalty M.D.   On: 05/16/2018 19:55    Assessment and Plan:   1. Anterolateral ST elevation myocardial infarction with cardiogenic shock: Unfortunately, the patient had stuttering chest pain since yesterday at noon.  The patient underwent emergent cardiac catheterization and was noted to have an occluded proximal LAD.  Shortly after performing quick balloon angioplasty on the LAD to establish flow, her hemodynamics worsened with significant hypotension and then she went into PEA arrest which required CPR for about 10 minutes followed by intubation.  She initially required norepinephrine drip for hypotension.  An intra-aortic balloon pump was placed with improved hemodynamics.  Norepinephrine drip was gradually weaned off.  PCI of the LAD was then performed with placement of drug-eluting stent placement.  The distal LAD remained occluded.  LVEDP was 34 mmHg before starting PCI.  Left ventricular angiography was not performed.  The patient will be transferred to [facility] for further care.  Continue heparin drip as long as intra-aortic balloon pump is in place.  Obtain an echocardiogram.  The patient was given a loading dose of Brilinta via OG tube.  Continue dual antiplatelet therapy for at least 1 year.  There was continuous oozing around the intra-aortic balloon pump sheath that might require a FemoStop.  There is residual significant disease in the proximal RCA which might require PCI but we have to evaluate the patient response to current intervention.  Given her age,  I do not think she is a good candidate for escalation of hemodynamic support. 2. Hyperlipidemia: Recommend high-dose statin. 3. Essential hypertension: Holding anti-hypertensive medications for now given cardiogenic shock but can start a small dose carvedilol and an ace inhibitor or ARB once hemodynamics improve.      For questions or updates, please contact [place] Please consult [email or web address] for contact info under     Signed, Kathlyn Sacramento, MD  05/19/2018 6:41 AM

## 2018-05-17 NOTE — Progress Notes (Signed)
[address]       [place] [address]             [phone]        Hollin Pumphrey Zumbrota Medical Record #[id number] Date of Birth: 06-08-1933  Referring: Linard Millers MD Primary Care: Jerrol Banana., MD Primary Cardiologist:No primary care provider on file. Patient examined, images of cardiac catheterization and both echocardiograms personally reviewed.  Situation discussed with Dr. Daneen Schick in ICU for coordination of care.  Patient's condition discussed with her family in the ICU.    Chief Complaint:   No chief complaint on file. 85 year old female with hypertension and history of LAD PCI 7 years ago currently in cardiogenic shock with severe metabolic acidosis which has been progressive over the past 12 hours.  She had a PCI of a closed proximal LAD about 12 hours ago during which she arrested.  She was loaded with Brilinta and had the LAD opened proximally.  Distally the vessel appeared to be small and a distal diagonal perforation was noted.  The patient developed bleeding from a balloon pump placed at the time of the PCI.  The patient became unstable and was transferred to this hospital.  The balloon pump was replaced and a right heart cath was performed showing step up in oxygen saturation into the pulmonary artery.  Cardiac index is 1.3 and patient has a severe metabolic acidosis with base deficit of 12.  The patient is oliguric.  Echocardiogram demonstrates the apical VSD with LVEF 40%.  There is mild MR.  History of Present Illness:     Previous LAD PCI Hypertension Diabetes Arthritis  Current Activity/ Functional Status: Patient lives alone.  Her husband of 61 years died approximately 1 year ago.   Zubrod Score: At the time of surgery this patient's most appropriate activity status/level should be described as: []     0    Normal activity, no symptoms []     1    Restricted in physical strenuous activity but ambulatory, able to do out light work  []     2    Ambulatory and capable of self care, unable to do work activities, up and about                 more than 50%  Of the time                            []     3    Only limited self care, in bed greater than 50% of waking hours []     4    Completely disabled, no self care, confined to bed or chair [x]     5    Moribund  Past Medical History:  Diagnosis Date  . Anginal pain ([place])   . Arthritis   . Coronary artery disease   . Diabetes mellitus without complication ([place])   . Diverticulitis   . Dysrhythmia   . Heart disease   . Hypercholesteremia   . Hypertension   . Palpitations     Past Surgical History:  Procedure Laterality Date  . CARDIAC SURGERY    . CATARACT EXTRACTION W/PHACO Left 01/28/2015   Procedure: CATARACT EXTRACTION PHACO AND INTRAOCULAR LENS PLACEMENT (IOC);  Surgeon: Estill Cotta, MD;  Location: [facility] ORS;  Service: Ophthalmology;  Laterality: Left;  [place] 01:13 AP% 58.5 CDE 30.55 fluid pack lot # [id number]  . CATARACT EXTRACTION  W/PHACO Right 03/18/2015   Procedure: CATARACT EXTRACTION PHACO AND INTRAOCULAR LENS PLACEMENT (IOC);  Surgeon: Estill Cotta, MD;  Location: [facility] ORS;  Service: Ophthalmology;  Laterality: Right;  [place]    1:08.3 AP%   24 CDE   30.33 fluid pack lot # [id number]  . CORONARY ANGIOPLASTY     stent  . CORONARY STENT PLACEMENT  06/17/2011  . CORONARY/GRAFT ACUTE MI REVASCULARIZATION N/A 05/02/2018   Procedure: Coronary/Graft Acute MI Revascularization;  Surgeon: Wellington Hampshire, MD;  Location: [place] CV LAB;  Service: Cardiovascular;  Laterality: N/A;  . FINGER SURGERY     for trigger finger  . IABP INSERTION N/A 05/14/2018   Procedure: IABP Insertion;  Surgeon: Wellington Hampshire, MD;  Location: [place] [facility] LAB;  Service: Cardiovascular;  Laterality: N/A;  . LEFT HEART CATH AND CORONARY ANGIOGRAPHY N/A 05/15/2018   Procedure: LEFT HEART CATH AND CORONARY ANGIOGRAPHY;  Surgeon: Wellington Hampshire, MD;  Location: [place]  CV LAB;  Service: Cardiovascular;  Laterality: N/A;  . TONSILLECTOMY      Social History   Tobacco Use  Smoking Status Never Smoker  Smokeless Tobacco Never Used    Social History   Substance and Sexual Activity  Alcohol Use No     Allergies  Allergen Reactions  . Sulfa Antibiotics Swelling    Swelling of throat  . Statins Other (See Comments)    Reaction: Joint and muscle pain severe    Current Facility-Administered Medications  Medication Dose Route Frequency Provider Last Rate Last Dose  . 0.9 %  sodium chloride infusion  250 mL Intravenous PRN Jettie Booze, MD      . 0.9 %  sodium chloride infusion  250 mL Intravenous PRN Jettie Booze, MD      . acetaminophen (TYLENOL) tablet 650 mg  650 mg Oral Q4H PRN Jettie Booze, MD      . aspirin chewable tablet 81 mg  81 mg Oral Daily Jettie Booze, MD      . famotidine (PEPCID) IVPB 20 mg in NS 100 mL IVPB  20 mg Intravenous Q12H Jettie Booze, MD      . fentaNYL (SUBLIMAZE) injection 50 mcg  50 mcg Intravenous Q15 min PRN Jettie Booze, MD   50 mcg at 05/24/2018 1223  . fentaNYL (SUBLIMAZE) injection 50 mcg  50 mcg Intravenous Q2H PRN Jettie Booze, MD      . heparin ADULT infusion 100 units/mL (25000 units/286mL sodium chloride 0.45%)  700 Units/hr Intravenous Continuous Jettie Booze, MD 7 mL/hr at 05/16/2018 1003 700 Units/hr at 04/30/2018 1003  . insulin aspart (novoLOG) injection 0-9 Units  0-9 Units Subcutaneous Q4H Jettie Booze, MD   7 Units at 05/15/2018 1159  . nitroGLYCERIN 50 mg in dextrose 5 % 250 mL (0.2 mg/mL) infusion  0-200 mcg/min Intravenous Titrated Larae Grooms S, MD      . norepinephrine (LEVOPHED) 4-5 MG/250ML-% infusion SOLN      105 mL/hr at 05/03/2018 1200 28 mcg/min at 05/17/18 1200  . norepinephrine (LEVOPHED) 4-5 MG/250ML-% infusion SOLN           . ondansetron (ZOFRAN) injection 4 mg  4 mg Intravenous Q6H PRN Larae Grooms S, MD      .  propofol (DIPRIVAN) 1000 MG/100ML infusion  0-50 mcg/kg/min Intravenous Continuous Jettie Booze, MD      . rosuvastatin (CRESTOR) tablet 20 mg  20 mg Oral q1800 Jettie Booze, MD      .  sodium chloride flush (NS) 0.9 % injection 3 mL  3 mL Intravenous Q12H Larae Grooms S, MD      . sodium chloride flush (NS) 0.9 % injection 3 mL  3 mL Intravenous PRN Jettie Booze, MD      . sodium chloride flush (NS) 0.9 % injection 3 mL  3 mL Intravenous Q12H Larae Grooms S, MD      . sodium chloride flush (NS) 0.9 % injection 3 mL  3 mL Intravenous PRN Jettie Booze, MD      . ticagrelor [facility]) tablet 90 mg  90 mg Oral BID Jettie Booze, MD        Medications Prior to Admission  Medication Sig Dispense Refill Last Dose  . amLODipine (NORVASC) 5 MG tablet Take 1 tablet (5 mg total) by mouth daily. 90 tablet 0 05/16/2018 at Unknown time  . aspirin 81 MG tablet Take 81 mg by mouth daily.    05/16/2018 at Unknown time  . cetirizine (ZYRTEC) 10 MG tablet Take 1 tablet (10 mg total) by mouth daily. (Patient taking differently: Take 10 mg by mouth daily as needed for allergies or rhinitis. )   prn at prn  . diltiazem (DILACOR XR) 120 MG 24 hr capsule Take 1 capsule (120 mg total) by mouth daily. 90 capsule 3 05/16/2018 at Unknown time  . ezetimibe (ZETIA) 10 MG tablet TAKE 1 TABLET BY MOUTH EVERY DAY (Patient taking differently: Take 10 mg by mouth daily. ) 90 tablet 3 05/16/2018 at Unknown time  . glucose blood test strip Use as instructed 100 each 12 Taking  . LORazepam (ATIVAN) 0.5 MG tablet Take 1 tablet (0.5 mg total) by mouth 2 (two) times daily as needed for anxiety. 60 tablet 4 prn at prn  . metoprolol succinate (TOPROL-XL) 50 MG 24 hr tablet TAKE 1 TABLET BY MOUTH EVERY DAY (Patient taking differently: Take 50 mg by mouth at bedtime. ) 90 tablet 3 05/15/2018 at Unknown time  . olmesartan (BENICAR) 40 MG tablet TAKE 1 TABLET BY MOUTH EVERY DAY (Patient taking  differently: Take 40 mg by mouth every evening. ) 90 tablet 4 05/16/2018 at Unknown time  . traZODone (DESYREL) 50 MG tablet TAKE 1 TABLET BY MOUTH EVERYDAY AT BEDTIME (Patient taking differently: Take 50 mg by mouth at bedtime. ) 90 tablet 2 05/15/2018 at Unknown time    Family History  Problem Relation Age of Onset  . Hypertension Mother   . Diabetes Mother   . Heart attack Father   . Colon cancer Sister      Review of Systems:  Patient intubated and unable to provide review of systems She was complaining of chest pain when she last spoke to her family midday yesterday, 24 hours ago. ROS      Cardiac Review of Systems: Y or  [    ]= no  Chest Pain [    ]  Resting SOB [   ] Exertional SOB  [  ]  Orthopnea [  ]   Pedal Edema [   ]    Palpitations [  ] Syncope  [  ]   Presyncope [   ]  General Review of Systems: [Y] = yes [  ]=no Constitional: recent weight change [  ]; anorexia [  ]; fatigue [  ]; nausea [  ]; night sweats [  ]; fever [  ]; or chills [  ]  Dental: Last Dentist visit:   Eye : blurred vision [  ]; diplopia [   ]; vision changes [  ];  Amaurosis fugax[  ]; Resp: cough [  ];  wheezing[  ];  hemoptysis[  ]; shortness of breath[  ]; paroxysmal nocturnal dyspnea[  ]; dyspnea on exertion[  ]; or orthopnea[  ];  GI:  gallstones[  ], vomiting[  ];  dysphagia[  ]; melena[  ];  hematochezia [  ]; heartburn[  ];   Hx of  Colonoscopy[  ]; GU: kidney stones [  ]; hematuria[  ];   dysuria [  ];  nocturia[  ];  history of     obstruction [  ]; urinary frequency [  ]             Skin: rash, swelling[  ];, hair loss[  ];  peripheral edema[  ];  or itching[  ]; Musculosketetal: myalgias[  ];  joint swelling[  ];  joint erythema[  ];  joint pain[  ];  back pain[  ];  Heme/Lymph: bruising[  ];  bleeding[  ];  anemia[  ];  Neuro: TIA[  ];  headaches[  ];  stroke[  ];  vertigo[  ];  seizures[  ];   paresthesias[  ];  difficulty  walking[  ];  Psych:depression[  ]; anxiety[  ];  Endocrine: diabetes[  ];  thyroid dysfunction[  ];              Physical Exam: BP (!) 149/51 (BP Location: Left Arm)   Pulse 91   Temp (!) 97.3 F (36.3 C) (Core)   Resp (!) 22   SpO2 100%    Sedated on ventilator, elderly female on balloon pump support Coarse breath sounds bilaterally 3/6 systolic murmur left sternal border heard well balloon pump on pause Abdomen soft with hypoactive bowel sounds Extremities cool, nonpalpable pedal pulses   Diagnostic Studies & Laboratory data:     Recent Radiology Findings:   Dg Chest 1 View  Result Date: 05/23/2018 CLINICAL DATA:  Hypoxia.  Myocardial infarction EXAM: CHEST  1 VIEW COMPARISON:  Chest radiograph and chest CT May 16, 2018 FINDINGS: Endotracheal tube tip is 4.0 cm above the carina. Nasogastric tube tip is not seen. Side port appears in the distal esophagus. No pneumothorax. There is a layering pleural effusion on the left. There is no appreciable edema or consolidation. Heart size and pulmonary vascularity are normal. No adenopathy. There is aortic atherosclerosis. No bone lesions. IMPRESSION: Tube and catheter positions as described without pneumothorax. Nasogastric tube side port appears above the diaphragm. It may be prudent to advance nasogastric tube approximately 8-10 cm to insure that nasogastric tube tip and side port are well below the diaphragm. Layering pleural effusion on the left. No frank edema or consolidation. Stable cardiac silhouette. Aortic Atherosclerosis (ICD10-I70.0). Electronically Signed   By: Lowella Grip III M.D.   On: 04/30/2018 08:42   Dg Chest Port 1 View  Result Date: 05/20/2018 CLINICAL DATA:  Acute MI status post cardiac catheterization. EXAM: PORTABLE CHEST 1 VIEW COMPARISON:  Chest x-ray from same day at 8:23 a.m. FINDINGS: Unchanged endotracheal tube with tip 4.2 cm above the carina. Unchanged enteric tube with proximal side port in the distal  esophagus. Interval placement of a femoral approach Swan-Ganz catheter with the tip in the right interlobar/basal pulmonary arteries. Stable cardiomediastinal silhouette. Unchanged mild interstitial pulmonary edema. Previously seen vague hazy density overlying the left lung likely reflecting layering pleural  effusion is less apparent on this exam. No consolidation or pneumothorax. No acute osseous abnormality. IMPRESSION: 1. Interval placement of a Swan-Ganz catheter with the tip in the right interlobar/basal pulmonary arteries. Recommend retraction 5-6 cm. 2. Unchanged enteric tube with proximal side port in the distal esophagus. Recommend advancement. 3. Unchanged pulmonary interstitial edema. Electronically Signed   By: Titus Dubin M.D.   On: 04/24/2018 11:52   Dg Chest Portable 1 View  Result Date: 05/16/2018 CLINICAL DATA:  Chest pain EXAM: PORTABLE CHEST 1 VIEW COMPARISON:  Jul 16, 2015 FINDINGS: There is no appreciable edema or consolidation. The heart size and pulmonary vascularity are normal. No adenopathy. No bone lesions. There is aortic atherosclerosis. IMPRESSION: No edema or consolidation. Heart size within normal limits. Aortic Atherosclerosis (ICD10-I70.0). Electronically Signed   By: Lowella Grip III M.D.   On: 05/16/2018 18:59   Ct Angio Chest Aorta W And/or Wo Contrast  Result Date: 05/16/2018 CLINICAL DATA:  Central chest pain radiating to the back and both arms. Patient was lifting weights this morning for the first time in a while. History of cardiac stent and hypertension. EXAM: CT ANGIOGRAPHY CHEST WITH CONTRAST TECHNIQUE: Multidetector CT imaging of the chest was performed using the standard protocol during bolus administration of intravenous contrast. Multiplanar CT image reconstructions and MIPs were obtained to evaluate the vascular anatomy. CONTRAST:  88mL OMNIPAQUE IOHEXOL 350 MG/ML SOLN COMPARISON:  MRI 08/15/2008 FINDINGS: Cardiovascular: LAD stent noted. Normal heart  size without pericardial effusion or thickening. Aortic atherosclerosis without aneurysm. Probable soft plaque along the periphery of the right brachiocephalic artery, series 2/63 slightly narrowing the lumen. The unenhanced pulmonary vasculature is unremarkable. Mediastinum/Nodes: No enlarged mediastinal, hilar, or axillary lymph nodes. Small hiatal hernia. Thyroid gland, trachea, and esophagus demonstrate no significant findings. Lungs/Pleura: Dependent bibasilar atelectasis. No pneumothorax, effusion or pulmonary consolidations. No dominant mass. Upper Abdomen: Redemonstration of hypodense bilateral adrenal adenomas, largest on the right measuring up to 2 cm. Musculoskeletal: Thoracic spondylosis. Osteoarthritis osteoarthritis of the sternoclavicular joints, right greater than left. Intact manubrium and sternum. No suspicious osseous lesions. Review of the MIP images confirms the above findings. IMPRESSION: 1. No acute cardiopulmonary abnormality. 2. Stent noted within the LAD. 3. Thoracic spondylosis. Osteoarthritis of the sternoclavicular joints, right greater than left. 4. Stable bilateral adrenal adenomas. Aortic Atherosclerosis (ICD10-I70.0). Electronically Signed   By: Ashley Royalty M.D.   On: 05/16/2018 19:55     I have independently reviewed the above radiologic studies and discussed with the patient   Recent Lab Findings: Lab Results  Component Value Date   WBC 12.1 (H) 04/24/2018   HGB 12.2 05/05/2018   HCT 36.0 05/24/2018   PLT 291 05/02/2018   GLUCOSE 233 (H) 05/03/2018   CHOL 278 (H) 01/05/2018   TRIG 246 (H) 01/05/2018   HDL 47 01/05/2018   LDLCALC 182 (H) 01/05/2018   ALT 13 01/05/2018   AST 14 01/05/2018   NA 134 (L) 05/21/2018   K 4.9 05/12/2018   CL 107 05/13/2018   CREATININE 1.47 (H) 05/11/2018   BUN 25 (H) 05/06/2018   CO2 20 (L) 05/16/2018   TSH 2.690 01/05/2018   INR 1.0 05/16/2018   HGBA1C 6.9 (H) 01/05/2018      Assessment / Plan:   Acute MI from LAD  thrombosis with cardiogenic shock ongoing for the past 24 hours.  Patient has deep metabolic acidosis and oliguric renal failure.  She was loaded with Brilinta about 12 hours ago for the LAD PCI.  At age 10 with cardiogenic shock and developing organ failure with recent Brilinta load, her chances of receiving benefit from emergency surgery for VSD closure are minimal and not realistic.  I have discussed the situation with the patient's family.  I would not recommend emergency surgery for this patient without reasonable expectation of helping her.        05/05/2018 12:40 PM

## 2018-05-17 NOTE — Progress Notes (Signed)
Patient was transported from the [place] lab to 2H19 without any complications.

## 2018-05-17 NOTE — Progress Notes (Signed)
[place] [place] for heparin Indication: IABP  Allergies  Allergen Reactions  . Sulfa Antibiotics Swelling    Swelling of throat  . Statins Other (See Comments)    Reaction: Joint and muscle pain severe    Patient Measurements: Wt: 78.4 kg Ht: 5'7" Heparin Dosing Weight: 77.4 kg  Vital Signs: Temp: 98.3 F (36.8 C) (03/24 0200) Temp Source: Oral (03/24 0200) BP: 181/72 (03/24 0554) Pulse Rate: 103 (03/24 0415)  Labs: Recent Labs    05/16/18 1839 05/16/18 2045 05/16/18 2153 04/25/2018 0148  HGB 13.2  --   --  13.5  HCT 42.1  --   --  43.4  PLT 299  --   --  291  APTT  --  >160*  --   --   LABPROT  --  13.5  --   --   INR  --  1.0  --   --   CREATININE 1.70*  --   --  1.47*  TROPONINI 0.04*  --  0.62* 12.77*    Estimated Creatinine Clearance: 31.3 mL/min (A) (by C-G formula based on SCr of 1.47 mg/dL (H)).   Medical History: Past Medical History:  Diagnosis Date  . Anginal pain ([place])   . Arthritis   . Coronary artery disease   . Diabetes mellitus without complication ([place])   . Diverticulitis   . Dysrhythmia   . Heart disease   . Hypercholesteremia   . Hypertension   . Palpitations    Assessment: 78 yom underwent cardiac cath yesterday at [facility] finding occluded proximal LAD at the edge of previously placed stent, RCA also 80% stenosed. She went into PEA arrest shortly after balloon inflations (10 minute CPR, intubated). Received IABP while in cath lab.   Hgb 13.5, plt 268. Transfer[facility] from [facility] on heparin infusion at 700 units/hr. No s/sx of bleeding - outside of oozing around the IABP sheath.    Goal of Therapy:  Heparin level 0.2-0.5 units/ml Monitor platelets by anticoagulation protocol: Yes   Plan:  Continue heparin infusion at 700 units/hr  Obtain heparin level now  Monitor daily HL, CBC, and for s/sx of bleeding  Antonietta Jewel, PharmD, [place] Clinical Pharmacist  Pager: [phone] Phone: [phone] 05/04/2018,7:32  AM

## 2018-05-17 NOTE — Progress Notes (Signed)
Attempted to contact pts daughter via telephone [facility], however she did not answer.  Voicemail message left to return my phone call.  Marda Stalker, [place] Pager [phone] (please enter 7 digits) PCCM Consult Pager [phone] (please enter 7 digits)

## 2018-05-17 NOTE — Progress Notes (Signed)
[place] for heparin Indication: IABP  Allergies  Allergen Reactions  . Sulfa Antibiotics Swelling    Swelling of throat  . Statins Other (See Comments)    Reaction: Joint and muscle pain severe    Patient Measurements: Wt: 78.4 kg Ht: 5'7" Heparin Dosing Weight: 77.4 kg  Vital Signs: Temp: 97.2 F (36.2 C) (03/24 1315) Temp Source: Core (03/24 1200) BP: 131/58 (03/24 1300) Pulse Rate: 85 (03/24 1315)  Labs: Recent Labs    05/16/18 1839 05/16/18 2045 05/16/18 2153 04/25/2018 0148 05/12/2018 0828 05/06/2018 0849 04/27/2018 1132 05/10/2018 1145  HGB 13.2  --   --  13.5  --  12.2  --   --   HCT 42.1  --   --  43.4  --  36.0  --   --   PLT 299  --   --  291  --   --   --   --   APTT  --  >160*  --   --   --   --   --   --   LABPROT  --  13.5  --   --   --   --   --   --   INR  --  1.0  --   --   --   --   --   --   HEPARINUNFRC  --   --   --   --  >2.20*  --   --  >2.20*  CREATININE 1.70*  --   --  1.47*  --   --   --   --   TROPONINI 0.04*  --  0.62* 12.77*  --   --  >65.00*  --     Estimated Creatinine Clearance: 31.3 mL/min (A) (by C-G formula based on SCr of 1.47 mg/dL (H)).   Medical History: Past Medical History:  Diagnosis Date  . Anginal pain ([place])   . Arthritis   . Coronary artery disease   . Diabetes mellitus without complication ([place])   . Diverticulitis   . Dysrhythmia   . Heart disease   . Hypercholesteremia   . Hypertension   . Palpitations    Assessment: 46 yom underwent cardiac cath yesterday at [facility] finding occluded proximal LAD at the edge of previously placed stent, RCA also 80% stenosed. She went into PEA arrest shortly after balloon inflations (10 minute CPR, intubated). Received IABP while in cath lab.   Hgb 13.5, plt 268. Transferred from [facility] on heparin infusion at 700 units/hr. No s/sx of bleeding - outside of oozing around the IABP sheath.    HL now >2.20 x 2 levels, no s/sx of bleeding and CBCs  stable.  Goal of Therapy:  Heparin level 0.2-0.5 units/ml Monitor platelets by anticoagulation protocol: Yes   Plan:  Stop heparin for 30 min and restart heparin infusion at 500 units/hr  Obtain heparin level at 6 hours Monitor daily HL, CBC, and for s/sx of bleeding  Gwenlyn Found, Sherian Rein D PGY1 Pharmacy Resident  Phone [phone] 05/19/2018   1:28 PM

## 2018-05-17 NOTE — Progress Notes (Signed)
[place] for heparin Indication: IABP  Allergies  Allergen Reactions  . Sulfa Antibiotics Swelling    Swelling of throat  . Statins Other (See Comments)    Reaction: Joint and muscle pain severe    Patient Measurements: Wt: 78.4 kg Ht: 5'7" Heparin Dosing Weight: 77.4 kg  Vital Signs: Temp: 98.6 F (37 C) (03/24 2000) Temp Source: Core (03/24 1600) BP: 128/35 (03/24 2000) Pulse Rate: 97 (03/24 1900)  Labs: Recent Labs    05/16/18 1839 05/16/18 2045  04/30/2018 0148 05/16/2018 0828  04/29/2018 1002 05/06/2018 1017 05/03/2018 1028 05/10/2018 1132 05/07/2018 1145 05/09/2018 1509 04/28/2018 1916 04/24/2018 1917  HGB 13.2  --   --  13.5  --    < > 12.2 11.9* 11.6*  --   --   --   --   --   HCT 42.1  --   --  43.4  --    < > 36.0 35.0* 34.0*  --   --   --   --   --   PLT 299  --   --  291  --   --   --   --   --   --   --   --   --   --   APTT  --  >160*  --   --   --   --   --   --   --   --   --   --   --   --   LABPROT  --  13.5  --   --   --   --   --   --   --   --   --   --   --   --   INR  --  1.0  --   --   --   --   --   --   --   --   --   --   --   --   HEPARINUNFRC  --   --   --   --  >2.20*  --   --   --   --   --  >2.20*  --  1.98*  --   CREATININE 1.70*  --   --  1.47*  --   --   --   --   --   --   --   --   --  2.41*  TROPONINI 0.04*  --    < > 12.77*  --   --   --   --   --  >65.00*  --  >65.00*  --  >65.00*   < > = values in this interval not displayed.    Estimated Creatinine Clearance: 19.1 mL/min (A) (by C-G formula based on SCr of 2.41 mg/dL (H)).   Medical History: Past Medical History:  Diagnosis Date  . Anginal pain ([place])   . Arthritis   . Coronary artery disease   . Diabetes mellitus without complication ([place])   . Diverticulitis   . Dysrhythmia   . Heart disease   . Hypercholesteremia   . Hypertension   . Palpitations    Assessment: 89 yom underwent cardiac cath yesterday at [facility] finding occluded proximal LAD  at the edge of previously placed stent, RCA also 80% stenosed. She went into PEA arrest shortly after balloon inflations (10 minute CPR, intubated). Received IABP while in cath lab.   Hgb 13.5, plt  268. Transferred from [facility] on heparin infusion at 700 units/hr. No s/sx of bleeding - outside of oozing around the IABP sheath.    HL still high at 1.98. D/w Rn on floor and was informed that heparin purge was still running from the cath lab which could be influencing levels. Instructed nurse to hold infusion x30 minutes (heparin purge off) and will recheck level in 6 hours.   Goal of Therapy:  Heparin level 0.2-0.5 units/ml Monitor platelets by anticoagulation protocol: Yes   Plan:  Stop heparin for 30 min and restart heparin infusion at 500 units/hr  Obtain heparin level at 6 hours Monitor daily HL, CBC, and for s/sx of bleeding  Erin Hearing PharmD., BCPS Clinical Pharmacist 04/27/2018 8:42 PM

## 2018-05-17 NOTE — Interval H&P Note (Signed)
History and Physical Interval Note:  05/10/2018 9:11 AM  Valerie Foster  has presented today for surgery, with the diagnosis of malfunction of balloon pump.  The various methods of treatment have been discussed with the patient and family. After consideration of risks, benefits and other options for treatment, the patient has consented to  Procedure(s): IABP INSERTION (N/A) as a surgical intervention.  The patient's history has been reviewed, patient examined, no change in status, stable for surgery.  I have reviewed the patient's chart and labs.  Questions were answered to the patient's satisfaction.    Plan for IABP changeout; increase sheath size to [place]

## 2018-05-17 NOTE — Progress Notes (Addendum)
CARDIOLOGY NOTE   Developed hemodynamic difficulty after repeated balloon pump alarm and malfunction.  There is oozing around the balloon pump.  There is a tendency for the balloon pump to migrate backward from access site.  Alarm signal suggesting a pump leak is repetitive.  Emergency 2D Doppler echocardiogram currently being done to assess LV function and to exclude pericardial effusion.  IV Levophed is being administered to support blood pressure.  Plan return to Cath Lab for exchange of intra-aortic balloon pump and swan.  Consider Impella.  Have spoken with the family and informed them of poor overall clinical condition and guarded prognosis.

## 2018-05-17 NOTE — Progress Notes (Addendum)
CARDIOLOGY ACCEPTANCE NOTE   All clinical data reviewed.  Elevated blood pressure, 180/80 mmHg.  Oozing from right femoral sheath.  Medically stable with intra-aortic balloon pump.  Patient is intubated and on ventilator.  We will need critical care medicine assistance with ventilator.  Digital images reviewed.  Apical LAD demonstrates systolic compression with decreased diastolic flow possibly related to distal embolization.  CKD stage III is likely to get worse and will need close follow-up.  Management strategy: Hemodynamic support, diuresis for heart failure, 2D Doppler echocardiogram, BNP baseline assessment, serial cardiac markers, repeat ECG, dual antiplatelet therapy, IV heparin, high intensity statin therapy per NG, and if blood pressure allows, IV nitroglycerin.  Critical care time 40 minutes

## 2018-05-17 NOTE — Progress Notes (Signed)
Patient transferred from Cath Lab via [facility] to [facility]. Personal belongings as described in personal belongings at admission in personal belongings bag (clothes, shoes, cell phone, purse, wallet with check book, credit cards) Patient said she had medications and glasses, but were not in the bag.  Family will be called, [place] or [employer] to [employer]. Night shift charge nurse will pass on to dayshift charge nurse to make sure the belongings are returned to the family.

## 2018-05-17 NOTE — Progress Notes (Addendum)
RT x3 responded to Code Blue in [place]. Patient was bagged until ED MD intubated. Patient then placed on Trilogy and Zoll monitor. Airway stable and secure. Procedure resumed. RT x1 remains with patient throughout remainder of procedure.

## 2018-05-17 NOTE — Progress Notes (Addendum)
Pt BP rapidly dropping with MAPs in the 40's. Dr. Tamala Julian paged. Fluids ran wide open Levophed started. Critical care at bedside. Half an amp of Epi given. BP's rising. Dr. Tamala Julian at bedside along with cath lab staff. Dr. Tamala Julian to notify family. RN will continue to monitor.

## 2018-05-17 NOTE — Progress Notes (Signed)
Informed pts granddaughter Derryl Harbor via telephone per pt request pt transferred for cardiac catheterization.  Will continue to monitor and assess pt.  Marda Stalker, [place] Pager [phone] (please enter 7 digits) PCCM Consult Pager [phone] (please enter 7 digits)

## 2018-05-17 NOTE — Progress Notes (Signed)
Pt transported to cath lab safely with RT.

## 2018-05-17 NOTE — ED Provider Notes (Signed)
Patient codes in Cath Lab I was called to see.  Patient getting compressions and then develops a pulse of spontaneously breathing the whole time.  Patient is in middle of cardiac cath.  Cardiologist asked me to intubate.  Patient given etomidate 20 mg.  This sedates her and that she gets 100 of Starkes.  Patient is intubated under direct vision with it was a #7-1/2 tube.  I watched the balloon go through the cords.  Good color change on the color monitor no breath sounds in the stomach and good bilateral breath sounds in the chest.  Cath proceeds.  Cardiologist assumes full responsibility the patient.   Nena Polio, MD 05/01/2018 [phone]

## 2018-05-17 NOTE — Progress Notes (Addendum)
Cardiology Follow Up   Right heart cath data demonstrated a step up with right atrial O2 saturation 50% and PA O2 saturation 85%.  These findings would suggest post infarct VSD or some previously unrecognized left to right shunt such as ASD or partial anomalous pulmonary venous return.  Needs to have a stat complete echo looking for evidence of septal rupture/VSD as a post infarct complication.  The echo has been ordered.  Reviewed previously performed echo, but there is no color-flow to help answer the question.  Continue with Levophed to support blood pressure.  Aortic balloon pump at one-to-one.  If VSD documented, will need to get a surgical opinion.  Critical care time 30 minutes.

## 2018-05-17 NOTE — Progress Notes (Signed)
Pastoral Care Visit    04/25/2018 0340  Clinical Encounter Type  Visited With Patient  Visit Type Initial;Code;Critical Care  Referral From Nurse  Consult/Referral To Chaplain  Stress Factors  Patient Stress Factors Health changes   Valerie Foster was called due to code.  Pt was in pain. Chap listened to pt and prayed with pt.  Darcey Nora, Chaplain

## 2018-05-17 NOTE — Progress Notes (Signed)
CARDIOLOGY FOLLOW-UP   Repeat echo demonstrates distal septal apical VSD.  Reviewed images with colleagues.  The distal septal region is akinetic but otherwise LV function is reasonable.  Dr. Johnsie Cancel and Dr. Burt Knack.  Spoke with family concerning these dire findings which will negatively impact her outcome, which was already poor.  Discussed with them that surgical closure may be a high risk option although at her age, she may not be a salvageable surgical candidate.  We will get a surgical consultation relative to whether or not we have treatment options.  Family is at bedside.  No decision concerning CODE STATUS/level of care until further input.

## 2018-05-17 NOTE — Progress Notes (Signed)
  Echocardiogram 2D Echocardiogram limited has been performed.  Jennette Dubin 05/17/2018, 11:43 AM

## 2018-05-17 NOTE — Progress Notes (Signed)
Dr. Hassell Done notified of absence of R pedal pulse with patient on IABP. UOP thus far 30- pt received 60mg  lasix prior to my arrival.   R radial site s/p TR band now oozing from site despite pressure dressing. Manual pressure held for 40 minutes. No longer oozing, new pressure dressing applied.   Also notified of K 5.9 and Cr 2.4.   Orders received for STAT CXR to verify balloon placement. PRN fluid bolus for UOP < 30.  Continuing to monitor patient closely and waiting for patients son to arrive from [place].

## 2018-05-17 NOTE — Progress Notes (Signed)
Dr. Fletcher Anon cardiologist in to speak with patient about doing heart catherization.

## 2018-05-18 ENCOUNTER — Inpatient Hospital Stay (HOSPITAL_COMMUNITY): Payer: Medicare Other

## 2018-05-18 ENCOUNTER — Encounter (HOSPITAL_COMMUNITY): Payer: Self-pay | Admitting: Interventional Cardiology

## 2018-05-18 DIAGNOSIS — I998 Other disorder of circulatory system: Secondary | ICD-10-CM

## 2018-05-18 DIAGNOSIS — Q210 Ventricular septal defect: Secondary | ICD-10-CM

## 2018-05-18 DIAGNOSIS — I259 Chronic ischemic heart disease, unspecified: Secondary | ICD-10-CM

## 2018-05-18 DIAGNOSIS — Z515 Encounter for palliative care: Secondary | ICD-10-CM

## 2018-05-18 DIAGNOSIS — I5021 Acute systolic (congestive) heart failure: Secondary | ICD-10-CM

## 2018-05-18 DIAGNOSIS — N179 Acute kidney failure, unspecified: Secondary | ICD-10-CM

## 2018-05-18 DIAGNOSIS — Z7189 Other specified counseling: Secondary | ICD-10-CM

## 2018-05-18 DIAGNOSIS — I2109 ST elevation (STEMI) myocardial infarction involving other coronary artery of anterior wall: Secondary | ICD-10-CM

## 2018-05-18 DIAGNOSIS — R570 Cardiogenic shock: Secondary | ICD-10-CM

## 2018-05-18 DIAGNOSIS — I2102 ST elevation (STEMI) myocardial infarction involving left anterior descending coronary artery: Secondary | ICD-10-CM

## 2018-05-18 LAB — HEPATIC FUNCTION PANEL
ALK PHOS: 39 U/L (ref 38–126)
ALT: 1083 U/L — ABNORMAL HIGH (ref 0–44)
AST: 1342 U/L — ABNORMAL HIGH (ref 15–41)
Albumin: 2 g/dL — ABNORMAL LOW (ref 3.5–5.0)
BILIRUBIN TOTAL: 0.4 mg/dL (ref 0.3–1.2)
Bilirubin, Direct: <0.1 mg/dL (ref 0.0–0.2)
Total Protein: 3.9 g/dL — ABNORMAL LOW (ref 6.5–8.1)

## 2018-05-18 LAB — CBC
HCT: 34.1 % — ABNORMAL LOW (ref 36.0–46.0)
Hemoglobin: 10 g/dL — ABNORMAL LOW (ref 12.0–15.0)
MCH: 26.3 pg (ref 26.0–34.0)
MCHC: 29.3 g/dL — ABNORMAL LOW (ref 30.0–36.0)
MCV: 89.7 fL (ref 80.0–100.0)
Platelets: 229 10*3/uL (ref 150–400)
RBC: 3.8 MIL/uL — ABNORMAL LOW (ref 3.87–5.11)
RDW: 13.7 % (ref 11.5–15.5)
WBC: 13.9 10*3/uL — ABNORMAL HIGH (ref 4.0–10.5)
nRBC: 0 % (ref 0.0–0.2)

## 2018-05-18 LAB — BASIC METABOLIC PANEL
Anion gap: 10 (ref 5–15)
BUN: 28 mg/dL — ABNORMAL HIGH (ref 8–23)
CO2: 12 mmol/L — ABNORMAL LOW (ref 22–32)
Calcium: 6 mg/dL — CL (ref 8.9–10.3)
Chloride: 119 mmol/L — ABNORMAL HIGH (ref 98–111)
Creatinine, Ser: 2.54 mg/dL — ABNORMAL HIGH (ref 0.44–1.00)
GFR calc Af Amer: 20 mL/min — ABNORMAL LOW (ref >60)
GFR, EST NON AFRICAN AMERICAN: 17 mL/min — AB (ref >60)
Glucose, Bld: 149 mg/dL — ABNORMAL HIGH (ref 70–99)
POTASSIUM: 4.6 mmol/L (ref 3.5–5.1)
Sodium: 141 mmol/L (ref 135–145)

## 2018-05-18 LAB — POCT I-STAT 7, (LYTES, BLD GAS, ICA,H+H)
Acid-base deficit: 17 mmol/L — ABNORMAL HIGH (ref 0.0–2.0)
Bicarbonate: 7.8 mmol/L — ABNORMAL LOW (ref 20.0–28.0)
Calcium, Ion: 0.97 mmol/L — ABNORMAL LOW (ref 1.15–1.40)
HCT: 24 % — ABNORMAL LOW (ref 36.0–46.0)
Hemoglobin: 8.2 g/dL — ABNORMAL LOW (ref 12.0–15.0)
O2 Saturation: 99 %
PCO2 ART: 15.2 mmHg — AB (ref 32.0–48.0)
Patient temperature: 37
Potassium: 5.7 mmol/L — ABNORMAL HIGH (ref 3.5–5.1)
Sodium: 140 mmol/L (ref 135–145)
TCO2: 8 mmol/L — ABNORMAL LOW (ref 22–32)
pH, Arterial: 7.315 — ABNORMAL LOW (ref 7.350–7.450)
pO2, Arterial: 162 mmHg — ABNORMAL HIGH (ref 83.0–108.0)

## 2018-05-18 LAB — MAGNESIUM: Magnesium: 1.6 mg/dL — ABNORMAL LOW (ref 1.7–2.4)

## 2018-05-18 LAB — GLUCOSE, CAPILLARY
GLUCOSE-CAPILLARY: 209 mg/dL — AB (ref 70–99)
Glucose-Capillary: 155 mg/dL — ABNORMAL HIGH (ref 70–99)
Glucose-Capillary: 119 mg/dL — ABNORMAL HIGH (ref 70–99)

## 2018-05-18 LAB — HEPARIN LEVEL (UNFRACTIONATED): HEPARIN UNFRACTIONATED: 0.31 [IU]/mL (ref 0.30–0.70)

## 2018-05-18 MED ORDER — CHLORHEXIDINE GLUCONATE CLOTH 2 % EX PADS: 6.0000 | MEDICATED_PAD | Freq: Every day | CUTANEOUS | Status: DC

## 2018-05-18 MED ORDER — MORPHINE SULFATE (PF) 2 MG/ML IV SOLN: 2.0000 mg | INTRAVENOUS | Status: DC | PRN

## 2018-05-18 MED ORDER — SODIUM BICARBONATE 8.4 % IV SOLN
100.0000 meq | Freq: Once | INTRAVENOUS | Status: AC
  Administered 2018-05-18: 100 meq via INTRAVENOUS

## 2018-05-18 MED ORDER — MORPHINE 100MG IN NS 100ML (1MG/ML) PREMIX INFUSION
1.0000 mg/h | INTRAVENOUS | Status: DC
  Filled 2018-05-18: qty 100

## 2018-05-18 MED ORDER — ACETAMINOPHEN 650 MG RE SUPP: 650.0000 mg | Freq: Four times a day (QID) | RECTAL | Status: DC | PRN

## 2018-05-18 MED ORDER — MORPHINE 100MG IN NS 100ML (1MG/ML) PREMIX INFUSION
1.0000 mg/h | INTRAVENOUS | Status: DC
  Administered 2018-05-18: 2 mg/h via INTRAVENOUS
  Filled 2018-05-18: qty 100

## 2018-05-18 MED ORDER — SODIUM BICARBONATE 8.4 % IV SOLN
INTRAVENOUS | Status: AC
  Administered 2018-05-18: 100 meq via INTRAVENOUS
  Filled 2018-05-18: qty 50

## 2018-05-18 MED ORDER — GLYCOPYRROLATE 0.2 MG/ML IJ SOLN: 0.2000 mg | INTRAMUSCULAR | Status: DC | PRN

## 2018-05-18 MED ORDER — SODIUM CHLORIDE 0.9% FLUSH: 10.0000 mL | INTRAVENOUS | Status: DC | PRN

## 2018-05-18 MED ORDER — GLYCOPYRROLATE 1 MG PO TABS: 1.0000 mg | ORAL_TABLET | ORAL | Status: DC | PRN

## 2018-05-18 MED ORDER — MORPHINE BOLUS VIA INFUSION
5.0000 mg | INTRAVENOUS | Status: DC | PRN
  Filled 2018-05-18: qty 5

## 2018-05-18 MED ORDER — DIPHENHYDRAMINE HCL 50 MG/ML IJ SOLN: 25.0000 mg | INTRAMUSCULAR | Status: DC | PRN

## 2018-05-18 MED ORDER — POLYVINYL ALCOHOL 1.4 % OP SOLN
1.0000 [drp] | Freq: Four times a day (QID) | OPHTHALMIC | Status: DC | PRN
  Filled 2018-05-18: qty 15

## 2018-05-18 MED ORDER — DEXTROSE 5 % IV SOLN: INTRAVENOUS | Status: DC

## 2018-05-18 MED ORDER — SODIUM CHLORIDE 0.9% FLUSH: 10.0000 mL | Freq: Two times a day (BID) | INTRAVENOUS | Status: DC

## 2018-05-18 MED ORDER — DOPAMINE-DEXTROSE 3.2-5 MG/ML-% IV SOLN
0.0000 ug/kg/min | INTRAVENOUS | Status: DC
  Administered 2018-05-18: 5.034 ug/kg/min via INTRAVENOUS

## 2018-05-18 MED ORDER — ACETAMINOPHEN 325 MG PO TABS: 650.0000 mg | ORAL_TABLET | Freq: Four times a day (QID) | ORAL | Status: DC | PRN

## 2018-05-18 MED ORDER — DOPAMINE-DEXTROSE 3.2-5 MG/ML-% IV SOLN
INTRAVENOUS | Status: AC
  Filled 2018-05-18: qty 250

## 2018-05-18 MED ORDER — MORPHINE 100MG IN NS 100ML (1MG/ML) PREMIX INFUSION: 0.0000 mg/h | INTRAVENOUS | Status: DC

## 2018-05-18 MED FILL — Lidocaine HCl Local Preservative Free (PF) Inj 1%: INTRAMUSCULAR | Qty: 30 | Status: AC

## 2018-05-19 LAB — CALCIUM, IONIZED: Calcium, Ionized, Serum: 3.8 mg/dL — ABNORMAL LOW (ref 4.5–5.6)

## 2018-05-23 ENCOUNTER — Telehealth: Payer: Self-pay

## 2018-05-23 ENCOUNTER — Telehealth: Payer: Self-pay | Admitting: Pulmonary Disease

## 2018-05-23 NOTE — Telephone Encounter (Signed)
05/23/18 I received a D/C from [facility]. We will take it to Dr. Nelda Marseille @ 2 M to be signed. PWR  05/23/18 Received D/C back from Dr.Yacoub. Called [facility] to pick up. PWR

## 2018-05-23 NOTE — Telephone Encounter (Signed)
FYI

## 2018-05-23 NOTE — Telephone Encounter (Signed)
Patient's daughter Mrs. Valerie Foster is wanting to thank Dr. Rosanna Randy.  [phone]

## 2018-05-25 NOTE — Progress Notes (Signed)
Pt went asystole >6 seconds multiple times at 0650. CCM notified and cardiology being paged. Orders given for STAT ABG, STAT add on mag. Ionized calcium still pending. Zoll pads are on and placed on zoll. Began pacing patient at 40 with capture and increased to a rate of 70 to maximize blood pressure, mA's at 42, initial capture seen at 32. Pt getting fluid bolus, on 40 mcg levo.   Cardiology NP came to bedside, reviewed ABG and ordered 139mEq of bicarb IVP. Dr. Tamala Julian being paged, Dr. Stanford Breed at bedside. Started pt on dopamine gtt at 5 mcg.   Dr. Tamala Julian called the family to update on described events, and family made patient a DNR. Continuing current care. Care assumed by day shift at this time.

## 2018-05-25 NOTE — Progress Notes (Signed)
NAME:  Valerie Foster, MRN:  [id number], DOB:  04-21-1933, LOS: 1 ADMISSION DATE:  05/16/2018, CONSULTATION DATE:  3/24 REFERRING MD:  Dr. Tamala Julian, CHIEF COMPLAINT:  MI   Brief History   85 year old female who presented with STEMI and had LAD occlusion treated with DES. 10 minute PEA arrest during cath.   History of present illness   Patient is encephalopathic and/or intubated. Therefore history has been obtained from chart review. Per Dr. Mortimer Fries initial consultation 3/23. This is an 85 yo female with a PMH of Palpitations, HTN, Hypercholesteremia, CAD, Dysrhythmias, Diverticulitis, Type II Diabetes Mellitus, Anginal Pain, and Arthritis.  She presented to [facility] ER on 03/23 via EMS from home with c/o intermittent central chest pain with radiation to her back and bilateral arms onset the afternoon of 03/23.  Per ER notes the pt stated she lifted weights for the first time on 03/23.  She also endorsed feeling clammy and nauseated.  EMS administered aspirin and zofran.  In the ER initial and repeat EKG revealed mild ST elevations in anterior leads with no reciprocal changes, pt did not meet STEMI criteria.  She received sublingual nitroglycerin with resolution of chest pain, however she continued to c/o upper back pain.  CTA Chest negative for acute cardiopulmonary abnormality, however initial troponin 0.04.  Pt ruled in for possible STEMI and nitroglycerin gtt initiated for chest pain.  Cardiology was consulted and she was taken to the cath lab, where she was found to have LAD occlusion which was treated with DES. She unfortunately suffered a 10 minute PEA arrest as part of the reperfusion process (there is some question as to if she truly had arrest). She developed shocks and IABP was placed. She was transferred to [facility] for further evaluation.   Past Medical History   has a past medical history of Anginal pain ([place]), Arthritis, Coronary artery disease, Diabetes mellitus without complication ([place]),  Diverticulitis, Dysrhythmia, Heart disease, Hypercholesteremia, Hypertension, and Palpitations.  Significant Hospital Events   3/23 admit 3/24 STEMI, quasi-arrest, Intuabtion, IABP, cath lab for DES, transfer to [facility].   Consults:    Procedures:  ETT 3/24 > R groin IABP 3/24 >  Significant Diagnostic Tests:  LHC 3/24 > LAD occlusion, DS, IABP.  Echo 3/24 >  Micro Data:    Antimicrobials:    Interim history/subjective:  Patient is actively dying, no events overnight  Objective   Blood pressure (!) 126/39, pulse 70, temperature 97.9 F (36.6 C), resp. rate (!) 26, SpO2 100 %. PAP: (22-54)/(13-47) 22/13 CVP:  [4 mmHg-32 mmHg] 4 mmHg CO:  [2.3 L/min-3.6 L/min] 2.3 L/min CI:  [1.2 L/min/m2-1.9 L/min/m2] 1.2 L/min/m2  Vent Mode: PRVC FiO2 (%):  [40 %-100 %] 60 % Set Rate:  [22 bmp] 22 bmp Vt Set:  [470 mL] 470 mL PEEP:  [5 cmH20] 5 cmH20 Plateau Pressure:  [17 cmH20-21 cmH20] 21 cmH20   Intake/Output Summary (Last 24 hours) at May 20, 2018 0945 Last data filed at 05/20/2018 0900 Gross per 24 hour  Intake 5725.41 ml  Output 450 ml  Net 5275.41 ml   There were no vitals filed for this visit.  Examination: General: Sedate elderly female, NAD HENT: [place]/AT, pupils sluggish Lungs: CTA bilaterally  Cardiovascular: RRR, Nl S1/S2 and -M/R/G Abdomen: Soft, NT, ND and +BS Extremities: Right arm very swollen and tight Neuro: Sedated GU: Foley  Resolved Hospital Problem list     Assessment & Plan:   Shock: likely cardiogenic in the setting of STEMI.  -  D/C IABP - D/C pressors - Tele monitoring - Cards notified   STEMI - Tele monitoring - Withdraw today  Acute hypoxemic respiratory failure secondary to near cardiac arrest - Terminal extubation today  Metabolic acidosis suspect lactic acidosis although no result - D/C further blood draw  DM - D/C CBGs  Best practice:  Diet: NPO Pain/Anxiety/Delirium protocol (if indicated): Propofol VAP protocol (if  indicated): Per protocol DVT prophylaxis: heparin infusion GI prophylaxis: Pepcid Glucose control: SSI Mobility: BR Code Status: FULL Family Communication: no family present Disposition: ICU, to cath lab.   Labs   CBC: Recent Labs  Lab 05/16/18 1839 05/16/2018 0148 05/03/2018 0849 05/20/2018 1002 04/27/2018 1017 05/10/2018 1028 2018/05/30 0344  WBC 9.5 12.1*  --   --   --   --  13.9*  NEUTROABS 6.9  --   --   --   --   --   --   HGB 13.2 13.5 12.2 12.2 11.9* 11.6* 10.0*  HCT 42.1 43.4 36.0 36.0 35.0* 34.0* 34.1*  MCV 85.9 85.6  --   --   --   --  89.7  PLT 299 291  --   --   --   --  144    Basic Metabolic Panel: Recent Labs  Lab 05/16/18 1839 04/24/2018 0148  05/03/2018 1002 05/14/2018 1017 05/20/2018 1028 05/09/2018 1917 May 30, 2018 0344  NA 138 139   < > 136 137 136 137 141  K 3.7 4.1   < > 4.7 4.6 4.8 5.9* 4.6  CL 104 107  --   --   --   --  109 119*  CO2 25 20*  --   --   --   --  8* 12*  GLUCOSE 139* 233*  --   --   --   --  262* 149*  BUN 29* 25*  --   --   --   --  29* 28*  CREATININE 1.70* 1.47*  --   --   --   --  2.41* 2.54*  CALCIUM 9.0 8.9  --   --   --   --  8.3* 6.0*  MG  --   --   --   --   --   --   --  1.6*   < > = values in this interval not displayed.   GFR: Estimated Creatinine Clearance: 18.1 mL/min (A) (by C-G formula based on SCr of 2.54 mg/dL (H)). Recent Labs  Lab 05/16/18 1839 05/14/2018 0148 May 30, 2018 0344  WBC 9.5 12.1* 13.9*    Liver Function Tests: Recent Labs  Lab 05-30-18 0457  AST 1,342*  ALT 1,083*  ALKPHOS 39  BILITOT 0.4  PROT 3.9*  ALBUMIN 2.0*   No results for input(s): LIPASE, AMYLASE in the last 168 hours. No results for input(s): AMMONIA in the last 168 hours.  ABG    Component Value Date/Time   PHART 7.282 (L) 05/16/2018 0849   PCO2ART 31.3 (L) 05/10/2018 0849   PO2ART 357.0 (H) 05/21/2018 0849   HCO3 13.1 (L) 05/14/2018 1028   TCO2 14 (L) 05/14/2018 1028   ACIDBASEDEF 13.0 (H) 04/24/2018 1028   O2SAT 85.0 05/09/2018  1028     Coagulation Profile: Recent Labs  Lab 05/16/18 2045  INR 1.0    Cardiac Enzymes: Recent Labs  Lab 05/16/18 2153 04/27/2018 0148 05/11/2018 1132 05/07/2018 1509 04/28/2018 1917  TROPONINI 0.62* 12.77* >65.00* >65.00* >65.00*    HbA1C: Hgb A1c MFr Bld  Date/Time Value  Ref Range Status  01/05/2018 11:36 AM 6.9 (H) 4.8 - 5.6 % Final    Comment:             Prediabetes: 5.7 - 6.4          Diabetes: >6.4          Glycemic control for adults with diabetes: <7.0   08/12/2017 04:04 PM 7.1 (H) 4.8 - 5.6 % Final    Comment:             Prediabetes: 5.7 - 6.4          Diabetes: >6.4          Glycemic control for adults with diabetes: <7.0     CBG: Recent Labs  Lab 04/29/2018 1520 05/16/2018 2027 04/29/2018 2335 May 23, 2018 0416 May 23, 2018 0843  GLUCAP 298* 251* 209* 155* 119*   The patient is critically ill with multiple organ systems failure and requires high complexity decision making for assessment and support, frequent evaluation and titration of therapies, application of advanced monitoring technologies and extensive interpretation of multiple databases.   Critical Care Time devoted to patient care services described in this note is  33  Minutes. This time reflects time of care of this signee Dr Jennet Maduro. This critical care time does not reflect procedure time, or teaching time or supervisory time of PA/NP/Med student/Med Resident etc but could involve care discussion time.  Rush Farmer, M.D. [facility] Pulmonary/Critical Care Medicine. Pager: [phone]. After hours pager: [phone].

## 2018-05-25 NOTE — Progress Notes (Signed)
    Called to the bedside by RN regarding declining status. Noted to have several episodes of ventricular stand still on telemetry with episodes >10sec. At time of arrival IABP with 50/41. Low Bicarb, order for 2 amps given. Started external pacing at 60, increased to 70bpm with capture. Levo increased to 40. Sats 85% on vent. Attending informed of patient rapidly deteriorating status. Discussed case with son over the phone via MD and decision made to make patient a NO CODE BLUE. Dr. Tamala Julian to see patient.   SignedReino Bellis, NP-C 06-09-18, 7:40 AM Pager: [phone]

## 2018-05-25 NOTE — Discharge Summary (Signed)
Date of admission 05/16/2018 Date of transfer to [facility] 04/28/2018 Admitting diagnosis Chest pain Acute myocardial infarction Hypertension Hyperlipidemia Transfer diagnosis Anterolateral ST segment elevation myocardial infarction Cardiogenic shock Hyperlipidemia Hypertension  History of present illness Valerie Foster  is a 85 y.o. female with a known history of diabetes, coronary artery disease, hypertension, hyperlipidemia, who presented to the hospital complaining of chest pain.  Patient says chest pain began around noon and it has progressively gotten worse and therefore she came to the ER for further evaluation.  She she says her pain is located in the center of her chest radiating to her back associated with some intermittent nausea vomiting.  She presented to the ER and received 3 sublingual nitroglycerin but her pain did not improve and therefore started on a nitroglycerin drip and also started on a heparin drip.  She was noted to have a mildly elevated troponin and ST changes in the inferior leads which are unchanged from her previous EKG.  She does have significant high risk factors for coronary disease and noted to have a mildly elevated troponin therefore hospitalist services were contacted for admission.  Patient denies any associated shortness of breath palpitations dizziness syncope with her chest pain.  Course of hospital stay Patient was seen by cardiology.  Patient had anterolateral ST elevation MI with cardiogenic shock.  Patient underwent emergent cardiac catheterization by cardiologist Dr. Fletcher Anon and was noted to have occluded proximal LAD.  Shortly after performing quick balloon angioplasty of the LAD to establish flow her hemodynamics were sent with significant hypotension and then she went into PEA arrest which required CPR for about 10 minutes followed by intubation.  She initially required norepinephrine drip for hypotension.  An intra-aortic balloon pump was  placed with improved hemodynamics.  Norepinephrine drip was gradually weaned off.  PCI of the LAD was then performed with placement of drug-eluting stent placement.  The distal LAD remained occluded.  LVEDP was 34 mmHg before starting PCI.  Left ventricular angiography was not performed plan was to transfer patient to [facility] for further care.  Heparin drip was continued as long as intra-aortic balloon pump was in place.  Echocardiogram was checked.  Patient was also given loading dose of Brilinta via OG tube.  Patient was transferred to [facility] for further care and intervention as per cardiology recommendations.

## 2018-05-25 NOTE — Progress Notes (Signed)
[place] for heparin Indication: IABP  Allergies  Allergen Reactions  . Sulfa Antibiotics Swelling    Swelling of throat  . Statins Other (See Comments)    Reaction: Joint and muscle pain severe    Patient Measurements: Wt: 78.4 kg Ht: 5'7" Heparin Dosing Weight: 77.4 kg  Vital Signs: Temp: 98.6 F (37 C) (03/25 0700) BP: 95/43 (03/25 0700) Pulse Rate: 93 (03/25 0600)  Labs: Recent Labs    05/16/18 1839 05/16/18 2045  04/29/2018 0148  05/11/2018 1017 05/09/2018 1028 05/24/2018 1132 05/05/2018 1145 05/01/2018 1509 05/15/2018 1916 05/02/2018 1917 May 20, 2018 0344 May 20, 2018 0620  HGB 13.2  --   --  13.5   < > 11.9* 11.6*  --   --   --   --   --  10.0*  --   HCT 42.1  --   --  43.4   < > 35.0* 34.0*  --   --   --   --   --  34.1*  --   PLT 299  --   --  291  --   --   --   --   --   --   --   --  229  --   APTT  --  >160*  --   --   --   --   --   --   --   --   --   --   --   --   LABPROT  --  13.5  --   --   --   --   --   --   --   --   --   --   --   --   INR  --  1.0  --   --   --   --   --   --   --   --   --   --   --   --   HEPARINUNFRC  --   --   --   --    < >  --   --   --  >2.20*  --  1.98*  --   --  0.31  CREATININE 1.70*  --   --  1.47*  --   --   --   --   --   --   --  2.41* 2.54*  --   TROPONINI 0.04*  --    < > 12.77*  --   --   --  >65.00*  --  >65.00*  --  >65.00*  --   --    < > = values in this interval not displayed.    Estimated Creatinine Clearance: 18.1 mL/min (A) (by C-G formula based on SCr of 2.54 mg/dL (H)).   Medical History: Past Medical History:  Diagnosis Date  . Anginal pain ([place])   . Arthritis   . Coronary artery disease   . Diabetes mellitus without complication ([place])   . Diverticulitis   . Dysrhythmia   . Heart disease   . Hypercholesteremia   . Hypertension   . Palpitations    Assessment: 64 yom underwent cardiac cath yesterday at [facility] finding occluded proximal LAD at the edge of previously  placed stent, RCA also 80% stenosed. She went into PEA arrest shortly after balloon inflations (10 minute CPR, intubated). Received IABP while in cath lab.   Hgb 13.5, plt 268. Transferred from [facility] on heparin infusion at  700 units/hr. No s/sx of bleeding - outside of oozing around the IABP sheath.    HL now therapeutic at 0.31. Hgb slightly decreased from 11.6 to 10. No s/sx of bleeding documented.   Goal of Therapy:  Heparin level 0.2-0.5 units/ml Monitor platelets by anticoagulation protocol: Yes   Plan:  Continue heparin drip at 350 units/hr.  Obtain heparin level at 6 hours Monitor daily HL, CBC, and for s/sx of bleeding  Gwenlyn Found, Sherian Rein D PGY1 Pharmacy Resident  Phone [phone] May 27, 2018   7:24 AM

## 2018-05-25 NOTE — Progress Notes (Signed)
Progress Note  Patient Name: Valerie Foster Date of Encounter: 10-Jun-2018  Primary Cardiologist:   Kathlyn Sacramento.  Subjective   The patient remains on support.  Without balloon pump, systolic blood pressures are less than 60 mmHg.  Severe oliguria/near and urea.  Earlier this morning the patient developed high-grade AV block and requires external pacing to maintain a heart rhythm.  Inpatient Medications    Scheduled Meds:  aspirin  81 mg Oral Daily   chlorhexidine gluconate (MEDLINE KIT)  15 mL Mouth Rinse BID   Chlorhexidine Gluconate Cloth  6 each Topical Daily   famotidine (PEPCID) IV  20 mg Intravenous Q24H   insulin aspart  0-9 Units Subcutaneous Q4H   mouth rinse  15 mL Mouth Rinse 10 times per day   rosuvastatin  20 mg Oral q1800   sodium chloride flush  10-40 mL Intracatheter Q12H   sodium chloride flush  3 mL Intravenous Q12H   sodium chloride flush  3 mL Intravenous Q12H   ticagrelor  90 mg Oral BID   Continuous Infusions:  sodium chloride 10 mL/hr at 06-10-18 0700   sodium chloride 10 mL/hr at 10-Jun-2018 0700   DOPamine     DOPamine 5.034 mcg/kg/min (Jun 10, 2018 0742)   heparin 350 Units/hr (Jun 10, 2018 0700)   nitroGLYCERIN     norepinephrine (LEVOPHED) Adult infusion 40 mcg/min (2018/06/10 0743)   propofol (DIPRIVAN) infusion 12 mcg/kg/min (06/10/2018 0700)   sodium chloride 1,000 mL (05/05/2018 2300)   PRN Meds: sodium chloride, sodium chloride, acetaminophen, fentaNYL (SUBLIMAZE) injection, fentaNYL (SUBLIMAZE) injection, ondansetron (ZOFRAN) IV, sodium chloride, sodium chloride flush, sodium chloride flush, sodium chloride flush   Vital Signs    Vitals:   06/10/18 0500 Jun 10, 2018 0600 06/10/2018 0700 Jun 10, 2018 0733  BP: (!) 121/47 (!) 126/52 (!) 95/43   Pulse:  93  71  Resp: (!) 24 (!) 27 (!) 32 (!) 32  Temp: 98.6 F (37 C) 98.4 F (36.9 C) 98.6 F (37 C) 97.9 F (36.6 C)  TempSrc:      SpO2:  100%  (!) 85%    Intake/Output Summary (Last 24  hours) at 06-10-18 0748 Last data filed at 06/10/2018 0700 Gross per 24 hour  Intake 4519.89 ml  Output 450 ml  Net 4069.89 ml   Last 3 Weights 05/16/2018 05/16/2018 04/14/2018  Weight (lbs) 172 lb 13.5 oz 170 lb 173 lb  Weight (kg) 78.4 kg 77.111 kg 78.472 kg      Telemetry    External pacing.  With external pacing off, P waves are noted without QRS complexes.- Personally Reviewed  ECG    The electrocardiogram completed at 8 AM on 04/25/2018 demonstrated inferior and anteroseptal Q waves with persistent ST elevation in the precordial leads.- Personally Reviewed  Physical Exam  Intubated GEN:  No movement and no evidence of respiratory distress Neck:  Difficult to assess due to apparatus present for support. Cardiac:  Loud holosystolic murmur with balloon pump turned off. Respiratory:  Decreased breath sounds anteriorly. GI: Soft, nontender, non-distended  MS: No edema; No deformity.   Neuro:   Not following commands.  Still sedated.  Unable to assess neurological status. Psych: Unable to assess.  Labs    Chemistry Recent Labs  Lab 05/10/2018 0148  05/09/2018 1028 04/29/2018 1917 06/10/2018 0344 10-Jun-2018 0457  NA 139   < > 136 137 141  --   K 4.1   < > 4.8 5.9* 4.6  --   CL 107  --   --  109  119*  --   CO2 20*  --   --  8* 12*  --   GLUCOSE 233*  --   --  262* 149*  --   BUN 25*  --   --  29* 28*  --   CREATININE 1.47*  --   --  2.41* 2.54*  --   CALCIUM 8.9  --   --  8.3* 6.0*  --   PROT  --   --   --   --   --  3.9*  ALBUMIN  --   --   --   --   --  2.0*  AST  --   --   --   --   --  1,342*  ALT  --   --   --   --   --  1,083*  ALKPHOS  --   --   --   --   --  39  BILITOT  --   --   --   --   --  0.4  GFRNONAA 33*  --   --  18* 17*  --   GFRAA 38*  --   --  21* 20*  --   ANIONGAP 12  --   --  20* 10  --    < > = values in this interval not displayed.     Hematology Recent Labs  Lab 05/16/18 1839 04/30/2018 0148  05/01/2018 1017 05/01/2018 1028 Jun 01, 2018 0344  WBC  9.5 12.1*  --   --   --  13.9*  RBC 4.90 5.07  --   --   --  3.80*  HGB 13.2 13.5   < > 11.9* 11.6* 10.0*  HCT 42.1 43.4   < > 35.0* 34.0* 34.1*  MCV 85.9 85.6  --   --   --  89.7  MCH 26.9 26.6  --   --   --  26.3  MCHC 31.4 31.1  --   --   --  29.3*  RDW 13.0 13.0  --   --   --  13.7  PLT 299 291  --   --   --  229   < > = values in this interval not displayed.    Cardiac Enzymes Recent Labs  Lab 04/24/2018 0148 05/23/2018 1132 05/15/2018 1509 05/07/2018 1917  TROPONINI 12.77* >65.00* >65.00* >65.00*   No results for input(s): TROPIPOC in the last 168 hours.   BNP Recent Labs  Lab 05/16/2018 0828  BNP 416.6*     DDimer No results for input(s): DDIMER in the last 168 hours.   Radiology    Dg Chest 1 View  Result Date: 05/06/2018 CLINICAL DATA:  Hypoxia.  Myocardial infarction EXAM: CHEST  1 VIEW COMPARISON:  Chest radiograph and chest CT May 16, 2018 FINDINGS: Endotracheal tube tip is 4.0 cm above the carina. Nasogastric tube tip is not seen. Side port appears in the distal esophagus. No pneumothorax. There is a layering pleural effusion on the left. There is no appreciable edema or consolidation. Heart size and pulmonary vascularity are normal. No adenopathy. There is aortic atherosclerosis. No bone lesions. IMPRESSION: Tube and catheter positions as described without pneumothorax. Nasogastric tube side port appears above the diaphragm. It may be prudent to advance nasogastric tube approximately 8-10 cm to insure that nasogastric tube tip and side port are well below the diaphragm. Layering pleural effusion on the left. No frank edema or consolidation. Stable cardiac silhouette. Aortic Atherosclerosis (  ICD10-I70.0). Electronically Signed   By: Lowella Grip III M.D.   On: 05/13/2018 08:42   Dg Chest Port 1 View  Result Date: 05/06/2018 CLINICAL DATA:  85 year old female with cardiogenic shock and intra aortic balloon pump placement. EXAM: PORTABLE CHEST 1 VIEW COMPARISON:  1128  hours today and earlier. FINDINGS: Portable AP semi upright view at 2133 hours. Intra-aortic balloon pump marker at the level of the distal arch, stable from earlier. Stable ET tube and visible enteric tube. Stable inferior approach Swan-Ganz catheter with tip at the level of the right lower lobe pulmonary artery. Stable cardiac size and mediastinal contours. Pulmonary edema appears regressed since 0823 hours today, particularly on the left. No pneumothorax. Possible small pleural effusions. No consolidation. IMPRESSION: 1. Intra-aortic balloon pump marker suspected at the level of the distal arch. 2. Otherwise stable lines and tubes. 3. Regressed pulmonary edema since 0823 hours today. No new cardiopulmonary abnormality. Electronically Signed   By: Genevie Ann M.D.   On: 05/03/2018 22:05   Dg Chest Port 1 View  Result Date: 05/04/2018 CLINICAL DATA:  Acute MI status post cardiac catheterization. EXAM: PORTABLE CHEST 1 VIEW COMPARISON:  Chest x-ray from same day at 8:23 a.m. FINDINGS: Unchanged endotracheal tube with tip 4.2 cm above the carina. Unchanged enteric tube with proximal side port in the distal esophagus. Interval placement of a femoral approach Swan-Ganz catheter with the tip in the right interlobar/basal pulmonary arteries. Stable cardiomediastinal silhouette. Unchanged mild interstitial pulmonary edema. Previously seen vague hazy density overlying the left lung likely reflecting layering pleural effusion is less apparent on this exam. No consolidation or pneumothorax. No acute osseous abnormality. IMPRESSION: 1. Interval placement of a Swan-Ganz catheter with the tip in the right interlobar/basal pulmonary arteries. Recommend retraction 5-6 cm. 2. Unchanged enteric tube with proximal side port in the distal esophagus. Recommend advancement. 3. Unchanged pulmonary interstitial edema. Electronically Signed   By: Titus Dubin M.D.   On: 04/28/2018 11:52   Dg Chest Portable 1 View  Result Date:  05/16/2018 CLINICAL DATA:  Chest pain EXAM: PORTABLE CHEST 1 VIEW COMPARISON:  Jul 16, 2015 FINDINGS: There is no appreciable edema or consolidation. The heart size and pulmonary vascularity are normal. No adenopathy. No bone lesions. There is aortic atherosclerosis. IMPRESSION: No edema or consolidation. Heart size within normal limits. Aortic Atherosclerosis (ICD10-I70.0). Electronically Signed   By: Lowella Grip III M.D.   On: 05/16/2018 18:59   Ct Angio Chest Aorta W And/or Wo Contrast  Result Date: 05/16/2018 CLINICAL DATA:  Central chest pain radiating to the back and both arms. Patient was lifting weights this morning for the first time in a while. History of cardiac stent and hypertension. EXAM: CT ANGIOGRAPHY CHEST WITH CONTRAST TECHNIQUE: Multidetector CT imaging of the chest was performed using the standard protocol during bolus administration of intravenous contrast. Multiplanar CT image reconstructions and MIPs were obtained to evaluate the vascular anatomy. CONTRAST:  12m OMNIPAQUE IOHEXOL 350 MG/ML SOLN COMPARISON:  MRI 08/15/2008 FINDINGS: Cardiovascular: LAD stent noted. Normal heart size without pericardial effusion or thickening. Aortic atherosclerosis without aneurysm. Probable soft plaque along the periphery of the right brachiocephalic artery, series 33/66slightly narrowing the lumen. The unenhanced pulmonary vasculature is unremarkable. Mediastinum/Nodes: No enlarged mediastinal, hilar, or axillary lymph nodes. Small hiatal hernia. Thyroid gland, trachea, and esophagus demonstrate no significant findings. Lungs/Pleura: Dependent bibasilar atelectasis. No pneumothorax, effusion or pulmonary consolidations. No dominant mass. Upper Abdomen: Redemonstration of hypodense bilateral adrenal adenomas, largest on the right measuring  up to 2 cm. Musculoskeletal: Thoracic spondylosis. Osteoarthritis osteoarthritis of the sternoclavicular joints, right greater than left. Intact manubrium and  sternum. No suspicious osseous lesions. Review of the MIP images confirms the above findings. IMPRESSION: 1. No acute cardiopulmonary abnormality. 2. Stent noted within the LAD. 3. Thoracic spondylosis. Osteoarthritis of the sternoclavicular joints, right greater than left. 4. Stable bilateral adrenal adenomas. Aortic Atherosclerosis (ICD10-I70.0). Electronically Signed   By: Ashley Royalty M.D.   On: 05/16/2018 19:55  No negative CMC needs EEG 1 last night yesterday and at, leg at home and everything when asked me know Cardiac Studies   2D Doppler echocardiogram 05/23/2018: IMPRESSIONS    1. The left ventricle has low normal systolic function, with an ejection fraction of 50-55%.  2. Severe hypokinesis to akinesis of the left ventricular distal anterior wall, anteroseptal wall, and apical segment, with hyperkinetic motion of the other segments.  3. Moderate (1.5 cm) distal anteroseptal ventricular septal defect (VSD) with left to right shunting.  4. Distal anteroseptal left ventricular aneurysmal segment.  5. Trivial pericardial effusion is present.  6. The pericardial effusion is posterior to the left ventricle.  Patient Profile     85 y.o. female with CAD, prior LAD stent, who underwent emergency LAD angioplasty and restenting in the setting of anterior infarction with ongoing pain for greater than 12 hours.  Balloon pump was placed in the setting of CHF and shock.  She was transferred to [facility] where further evaluation identified apical post infarct VSD.  Seen in consultation by Dr. Tharon Aquas Trigt who felt the patient was not salvageable from the standpoint of emergency surgery due to shock state, acidosis, Brilinta, and advanced age.  Patient made DNR on 2018/06/01 after consultation with family/daughter Mliss Sax.  Assessment & Plan    1. Cardiogenic shock secondary to post infarct ventricular septal rupture involving the distal ventricular septum. 2. Acute systolic heart  failure due to acute anterior infarction and VSD with left-to-right shunting 3. Ischemic right lower extremity likely related to obstruction due to intra-aortic balloon pump.  The patient is balloon pump dependent. 4. Acute kidney failure, stage IV with anuria. 5. DNR   Have spoke with patient's family this morning, primarily her daughter Mliss Sax.  The patient has been made DNR.  We will not escalate therapy further.  Outcome is poor and care of the will be provided is now comfort.    For questions or updates, please contact [place] Please consult [email or web address] for contact info under        Signed, Sinclair Grooms, MD  06-01-2018, 7:48 AM

## 2018-05-25 NOTE — Progress Notes (Signed)
Wasted 75cc of Morphine gtt in Stericycle. Witnessed by [employer]

## 2018-05-25 NOTE — Discharge Summary (Signed)
Patient ID: Valerie Foster MRN: [id number] DOB/AGE: 1933/10/15 85 y.o.  Admit date: 04/29/2018 Discharge date: May 29, 2018  Primary Discharge Diagnosis: Cardiogenic shock Secondary Discharge Diagnosis: Post MI ventriculoseptal rupture/VSD  Significant Diagnostic Studies: Coronary angiography, emergency LAD stent, echocardiography,  Consults: Critical Care Medicine- Dr. Nelda Marseille ; Cardiac Surgery- [facility], Dr. Lucianne Lei [facility] Course: The patient was accepted in transfer from [facility] early on 05/16/2018.  She was transferred to [facility] after undergoing emergency angioplasty and stenting of the LAD.  The interventional procedure was performed late in the course of her myocardial infarction with the patient having had greater than 12 hours of pain while in the hospital.  A balloon pump was inserted at [facility] because of hemodynamic instability and early cardiogenic shock.  The patient was intubated during the catheterization procedure.  There was oozing noted around the balloon pump access site.  Levophed was started to support the blood pressure.  The patient did have PEA arrest and required CPR for less than 10 minutes during the procedure.  Upon arrival at [facility], blood pressure was noted to be low and there was malfunction of the balloon pump with multiple alarms and frequent discontinuation of pumping because of potential balloon leak.  The balloon pump was unstable at the access site tending to gravitate outward resulting in continuous oozing.  Also upon arrival at [facility], a [place] service consultation was obtained to help with ventilator management.  She was taken back to the Cath Lab, and the balloon pump was exchanged.  In addition a right heart catheter was performed.  A routine mixed venous O2 sample from the pulmonary artery was performed and was noted to be significantly elevated at 85%.  A right atrial  sample was 52%.  This step up through the heart suggested left to right shunt and in this setting the development of a post infarction ventriculoseptal defect.  With the balloon pump off, a systolic murmur was audible.  An echocardiogram with Doppler imaging was repeated and demonstrated significant apical left to right shunting through a post infarct VSD.  An urgent cardiac surgical consult was obtained, provided by Dr. Tharon Aquas Trigt.  Because of loading with Brilinta, age, kidney failure, and other evidence of endorgan failure including elevated liver enzymes, surgical therapy was felt to be a treatment that would not benefit the patient or improve her chances of survival.  The family including her daughter Mliss Sax, spoke with cardiac surgery relative to the absence of treatment options.  I then had a long conversation with the family, essentially advising that no further treatment options were possible and that continued supportive therapy would likely be futile.  We discussed CODE STATUS, but the family was unable to make a decision.  Over the next 24 hours, the patient required continuous intra-aortic balloon counterpulsation, escalation of vasopressor therapy (Levophed), and eventually developed high-grade AV block requiring external pacing.  Laboratory data demonstrated significant increase in creatinine to greater than 2.5, elevated liver enzymes, and elevated lactate levels.  The right leg was cool and pulseless likely due to occlusion of the femoral artery from the indwelling balloon pump.  Liver enzymes continued to climb.  The patient was seen later in the morning by critical care and after speaking with the family, support was withdrawn.  The patient was pronounced dead at 10:22 AM.   2 D DOPPLER ECHOCARDIOGRAM 05/17/2018: IMPRESSIONS    1. The left ventricle has low  normal systolic function, with an ejection fraction of 50-55%.  2. Severe hypokinesis to akinesis of the left  ventricular distal anterior wall, anteroseptal wall, and apical segment, with hyperkinetic motion of the other segments.  3. Moderate (1.5 cm) distal anteroseptal ventricular septal defect (VSD) with left to right shunting.  4. Distal anteroseptal left ventricular aneurysmal segment.  5. Trivial pericardial effusion is present.  6. The pericardial effusion is posterior to the left ventricle. Labs:   Lab Results  Component Value Date   WBC 13.9 (H) 06/08/2018   HGB 8.2 (L) 06-08-2018   HCT 24.0 (L) 06/08/2018   MCV 89.7 06-08-2018   PLT 229 2018-06-08    Recent Labs  Lab 2018-06-08 0344 Jun 08, 2018 0457 June 08, 2018 0701  NA 141  --  140  K 4.6  --  5.7*  CL 119*  --   --   CO2 12*  --   --   BUN 28*  --   --   CREATININE 2.54*  --   --   CALCIUM 6.0*  --   --   PROT  --  3.9*  --   BILITOT  --  0.4  --   ALKPHOS  --  39  --   ALT  --  1,083*  --   AST  --  1,342*  --   GLUCOSE 149*  --   --    Lab Results  Component Value Date   CKTOTAL 101 06/17/2011   CKMB 0.9 06/17/2011   TROPONINI >65.00 (HH) 05/23/2018    Lab Results  Component Value Date   CHOL 278 (H) 01/05/2018   CHOL 217 (H) 04/16/2017   CHOL 253 (H) 04/09/2016   Lab Results  Component Value Date   HDL 47 01/05/2018   HDL 57 04/16/2017   HDL 46 04/09/2016   Lab Results  Component Value Date   LDLCALC 182 (H) 01/05/2018   LDLCALC 127 (H) 04/16/2017   LDLCALC 154 (H) 04/09/2016   Lab Results  Component Value Date   TRIG 96 04/28/2018   TRIG 246 (H) 01/05/2018   TRIG 165 (H) 04/16/2017   Lab Results  Component Value Date   CHOLHDL 5.9 (H) 01/05/2018   CHOLHDL 3.8 04/16/2017     Portable chest x-ray 06-08-2018: Radiology:   IMPRESSION: 1. Stable position of support apparatus as above. No pneumothorax. Of note, tip of intra-aortic balloon pump is approximately 0.8 cm caudal to the cranial aspect of the aortic arch. Enteric tube side port projects over the distal esophagus. Advancement at least  10 cm is advised. 2. Findings most suggestive of worsening pulmonary edema and perihilar atelectasis.  Time spent with patient to include physician time: 31 minutes Signed: Belva Crome III 06-08-18, 2:02 PM

## 2018-05-25 NOTE — Progress Notes (Signed)
CRITICAL VALUE ALERT  Critical Value:  Calcium 6.0  Date & Time Notied:  June 15, 2018 4:45 AM  Provider Notified: Dr. Hassell Done  Orders Received/Actions taken: Add on hepatic function lab to AM labs, send ionized calcium.

## 2018-05-25 NOTE — TOC Benefit Eligibility Note (Signed)
Transition of Care [facility]) Benefit Eligibility Note    Patient Details  Name: Valerie Foster MRN: [id number] Date of Birth: 05-May-1933   Medication/Dose: BRILINTA  90 MG BID  Covered?: Yes     Prescription Coverage [facility] with Person/Company/Phone Number:: [employer] [facility]  # [phone]   Co-Pay: $ 189.75  Prior Approval: No  Deductible: Met  Additional Notes: 90 DAY SUPPLY FOR  RETAIL  OR M/O $ 125.00 Marvia Pickles Phone Number: 2018/06/07, 10:32 AM

## 2018-05-25 NOTE — Progress Notes (Signed)
RT NOTE: RT extubated patient to comfort per MD order. RN at bedside.

## 2018-05-25 NOTE — Progress Notes (Signed)
Nutrition Brief Note  Chart reviewed. Pt now transitioning to comfort care.  No further nutrition interventions warranted at this time.  Please re-consult as needed.   Arlette Schaad RD, LDN Clinical Nutrition Pager # - [phone]    

## 2018-05-25 NOTE — Care Management (Signed)
Brilinta benefits check sent and pending.  Jaidan Prevette RN, BSN, NCM-BC, ACM-RN [phone] 

## 2018-05-25 NOTE — Progress Notes (Signed)
CDS notified of patient's death at 9. Not suitable for donation. Referral [phone]. Bedside RN notified.

## 2018-05-25 DEATH — deceased

## 2018-05-27 ENCOUNTER — Encounter (HOSPITAL_COMMUNITY): Payer: Self-pay | Admitting: Interventional Cardiology

## 2018-07-05 ENCOUNTER — Ambulatory Visit: Payer: Self-pay | Admitting: Family Medicine
